# Patient Record
Sex: Male | Born: 1950
Health system: Southern US, Community
[De-identification: ages and names within clinical notes are randomized; demographics above are authoritative.]

## PROBLEM LIST (undated history)

## (undated) DIAGNOSIS — K219 Gastro-esophageal reflux disease without esophagitis: Secondary | ICD-10-CM

## (undated) DIAGNOSIS — M1990 Unspecified osteoarthritis, unspecified site: Secondary | ICD-10-CM

## (undated) DIAGNOSIS — Z789 Other specified health status: Secondary | ICD-10-CM

## (undated) DIAGNOSIS — I10 Essential (primary) hypertension: Secondary | ICD-10-CM

## (undated) DIAGNOSIS — Z860100 Personal history of colon polyps, unspecified: Secondary | ICD-10-CM

## (undated) DIAGNOSIS — E785 Hyperlipidemia, unspecified: Secondary | ICD-10-CM

## (undated) DIAGNOSIS — N419 Inflammatory disease of prostate, unspecified: Secondary | ICD-10-CM

## (undated) DIAGNOSIS — C439 Malignant melanoma of skin, unspecified: Secondary | ICD-10-CM

## (undated) DIAGNOSIS — I219 Acute myocardial infarction, unspecified: Secondary | ICD-10-CM

## (undated) DIAGNOSIS — I2510 Atherosclerotic heart disease of native coronary artery without angina pectoris: Secondary | ICD-10-CM

## (undated) DIAGNOSIS — C801 Malignant (primary) neoplasm, unspecified: Secondary | ICD-10-CM

## (undated) DIAGNOSIS — Z7902 Long term (current) use of antithrombotics/antiplatelets: Secondary | ICD-10-CM

## (undated) HISTORY — PX: TONSILLECTOMY: SUR1361

## (undated) HISTORY — PX: KNEE ARTHROSCOPY: SUR90

## (undated) HISTORY — PX: COLON SURGERY: SHX602

## (undated) HISTORY — PX: ESOPHAGEAL DILATION: SHX303

---

## 2004-01-27 ENCOUNTER — Ambulatory Visit: Payer: Self-pay

## 2006-10-10 ENCOUNTER — Ambulatory Visit: Payer: Self-pay | Admitting: Family Medicine

## 2007-09-24 ENCOUNTER — Ambulatory Visit: Payer: Self-pay | Admitting: Family Medicine

## 2011-08-23 ENCOUNTER — Ambulatory Visit: Payer: Self-pay | Admitting: Unknown Physician Specialty

## 2011-09-11 ENCOUNTER — Ambulatory Visit: Payer: Self-pay | Admitting: Unknown Physician Specialty

## 2011-09-11 LAB — HM COLONOSCOPY

## 2011-10-02 ENCOUNTER — Ambulatory Visit: Payer: Self-pay | Admitting: Surgery

## 2011-10-09 ENCOUNTER — Inpatient Hospital Stay: Payer: Self-pay | Admitting: Surgery

## 2011-10-10 LAB — CBC WITH DIFFERENTIAL/PLATELET
Basophil #: 0 10*3/uL (ref 0.0–0.1)
Basophil %: 0.4 %
Eosinophil #: 0 10*3/uL (ref 0.0–0.7)
Eosinophil %: 0.1 %
HCT: 42.5 % (ref 40.0–52.0)
HGB: 14 g/dL (ref 13.0–18.0)
MCH: 29.2 pg (ref 26.0–34.0)
Monocyte #: 1.2 x10 3/mm — ABNORMAL HIGH (ref 0.2–1.0)
Neutrophil #: 8.1 10*3/uL — ABNORMAL HIGH (ref 1.4–6.5)
Neutrophil %: 69.5 %
RBC: 4.79 10*6/uL (ref 4.40–5.90)
WBC: 11.6 10*3/uL — ABNORMAL HIGH (ref 3.8–10.6)

## 2011-10-10 LAB — BASIC METABOLIC PANEL
BUN: 7 mg/dL (ref 7–18)
Creatinine: 0.94 mg/dL (ref 0.60–1.30)
EGFR (African American): >60
EGFR (Non-African Amer.): >60
Glucose: 123 mg/dL — ABNORMAL HIGH (ref 65–99)
Sodium: 141 mmol/L (ref 136–145)

## 2012-08-05 ENCOUNTER — Ambulatory Visit: Payer: Self-pay

## 2013-09-04 ENCOUNTER — Emergency Department: Payer: Self-pay | Admitting: Emergency Medicine

## 2013-09-04 LAB — BASIC METABOLIC PANEL
ANION GAP: 5 — AB (ref 7–16)
BUN: 15 mg/dL (ref 7–18)
CALCIUM: 9.3 mg/dL (ref 8.5–10.1)
CHLORIDE: 107 mmol/L (ref 98–107)
Co2: 29 mmol/L (ref 21–32)
Creatinine: 0.99 mg/dL (ref 0.60–1.30)
EGFR (African American): >60
EGFR (Non-African Amer.): >60
GLUCOSE: 95 mg/dL (ref 65–99)
OSMOLALITY: 282 (ref 275–301)
POTASSIUM: 3.9 mmol/L (ref 3.5–5.1)
Sodium: 141 mmol/L (ref 136–145)

## 2013-09-04 LAB — CBC WITH DIFFERENTIAL/PLATELET
BASOS PCT: 0.6 %
Basophil #: 0 10*3/uL (ref 0.0–0.1)
EOS ABS: 0.1 10*3/uL (ref 0.0–0.7)
Eosinophil %: 0.9 %
HCT: 47.5 % (ref 40.0–52.0)
HGB: 15.6 g/dL (ref 13.0–18.0)
LYMPHS PCT: 23.2 %
Lymphocyte #: 1.5 10*3/uL (ref 1.0–3.6)
MCH: 29.7 pg (ref 26.0–34.0)
MCHC: 32.9 g/dL (ref 32.0–36.0)
MCV: 90 fL (ref 80–100)
MONO ABS: 0.6 x10 3/mm (ref 0.2–1.0)
MONOS PCT: 10.2 %
NEUTROS ABS: 4.1 10*3/uL (ref 1.4–6.5)
Neutrophil %: 65.1 %
Platelet: 215 10*3/uL (ref 150–440)
RBC: 5.26 10*6/uL (ref 4.40–5.90)
RDW: 12.9 % (ref 11.5–14.5)
WBC: 6.3 10*3/uL (ref 3.8–10.6)

## 2013-09-04 LAB — TROPONIN I

## 2013-10-02 IMAGING — CT CT ABD-PELV W/ CM
1 of 3 series · 14 of 32 positions shown, 19 images · IV contrast (isovue)
Comparison: 09/24/2007

REASON FOR EXAM: LLQ Pain Change in Bowel Habits
COMMENTS:

PROCEDURE:     KCT - KCT ABDOMEN/PELVIS W  - August 23, 2011 [DATE]
RESULT:     History: Left lower quadrant pain
TECHNIQUE: Multiple axial images of the abdomen and pelvis were performed
from the lung bases to the pubic symphysis, with p.o. contrast and with 100
ml of Isovue 370 intravenous contrast.

[Series 2: abd 3mm w 3.0 i40f 3 · axial · 0.88mm/px · z∈[-779,-356]mm · 14 of 159 slices shown, 19 images]
[im 9/159  soft-tissue]
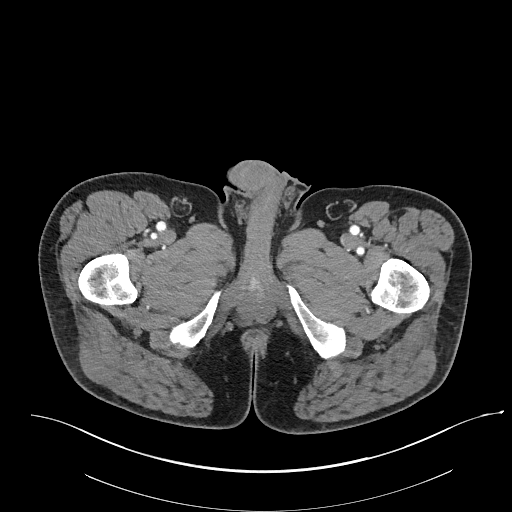
[im 9/159  bone]
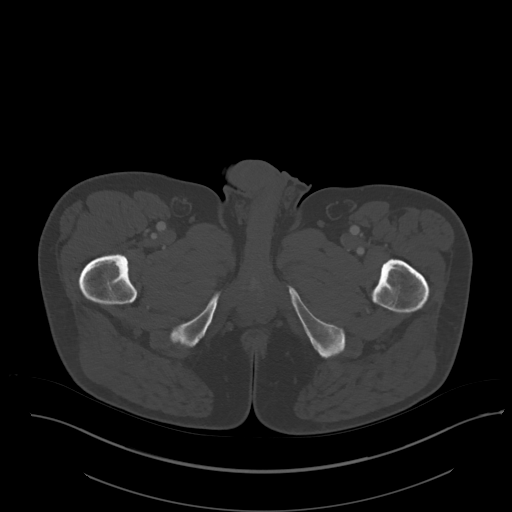
[im 25/159  soft-tissue]
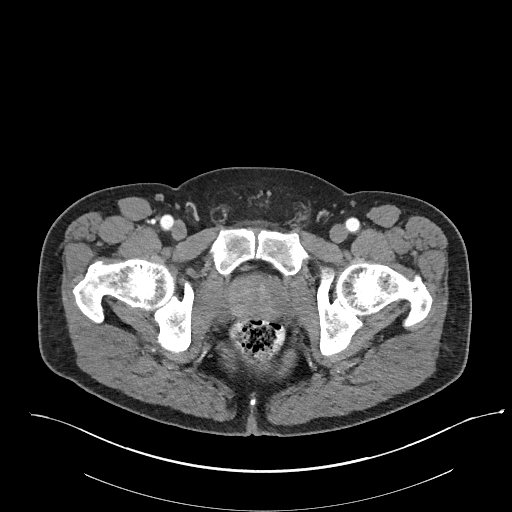
[im 34/159  soft-tissue]
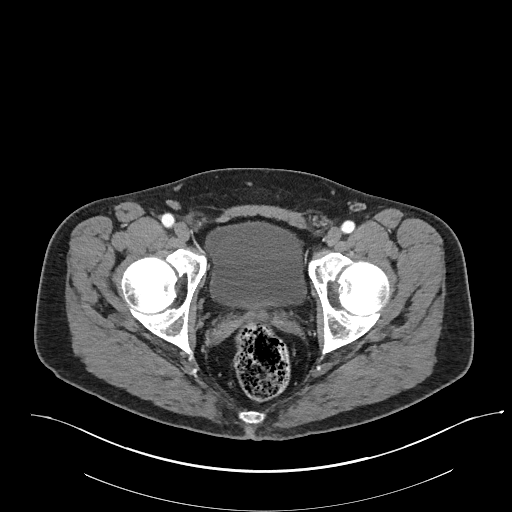
[im 42/159  soft-tissue]
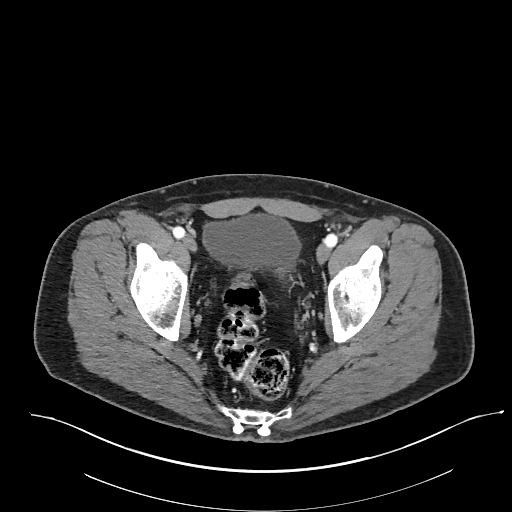
[im 59/159  soft-tissue]
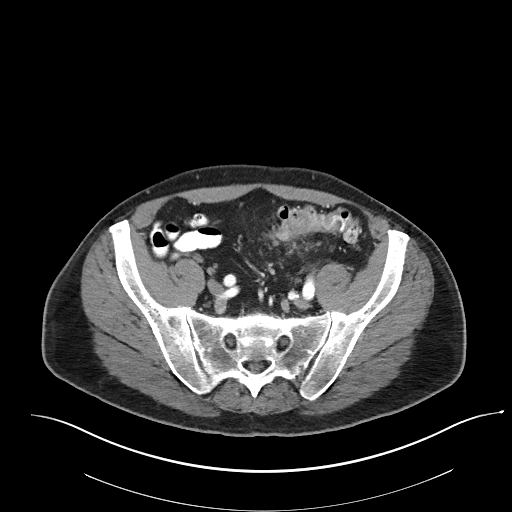
[im 67/159  soft-tissue]
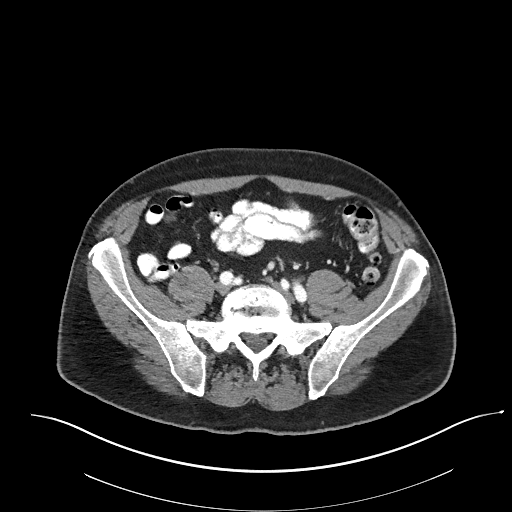
[im 84/159  soft-tissue]
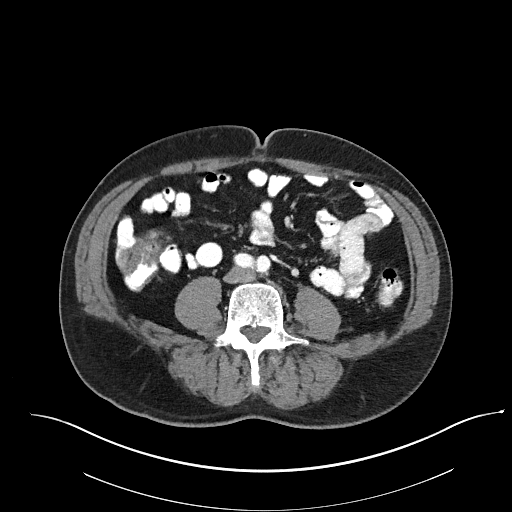
[im 92/159  soft-tissue]
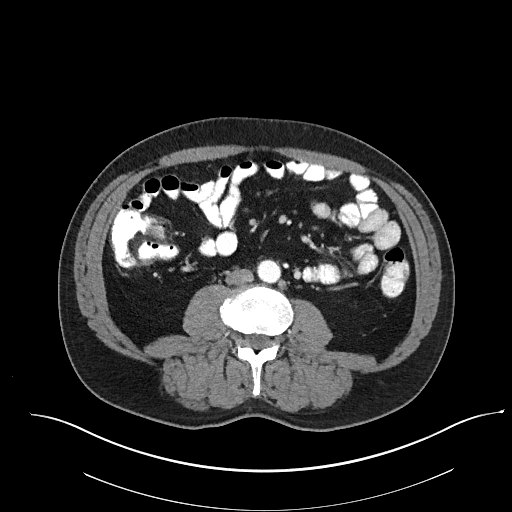
[im 100/159  soft-tissue]
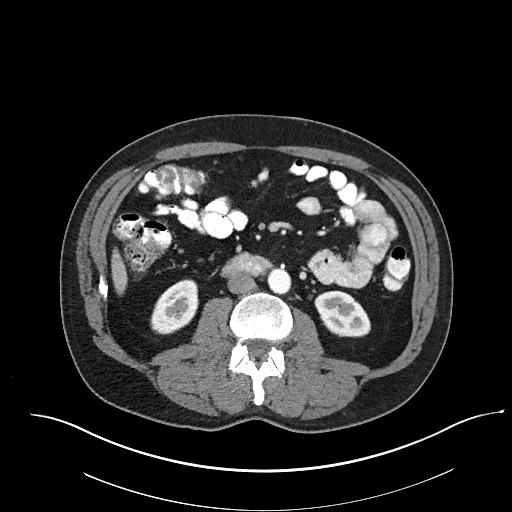
[im 100/159  bone]
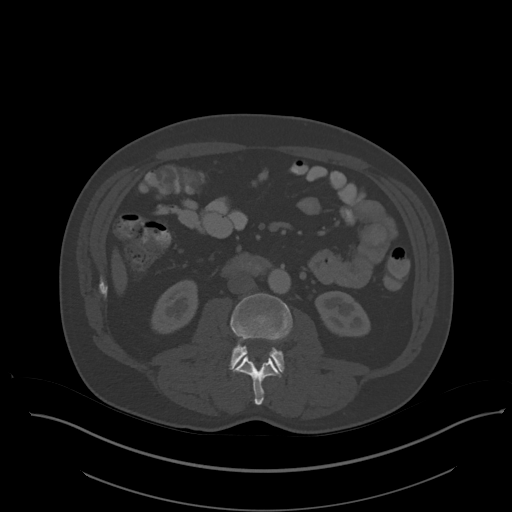
[im 117/159  soft-tissue]
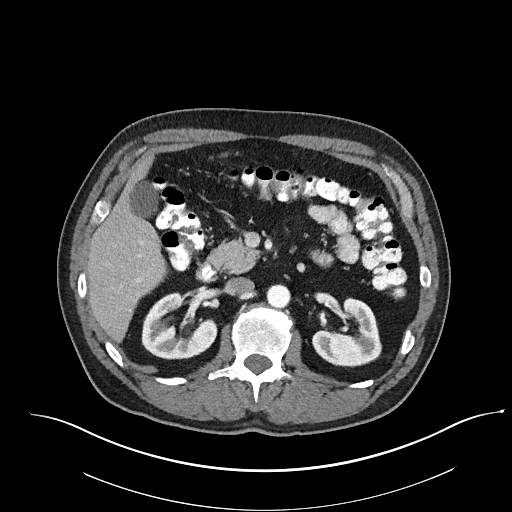
[im 125/159  soft-tissue]
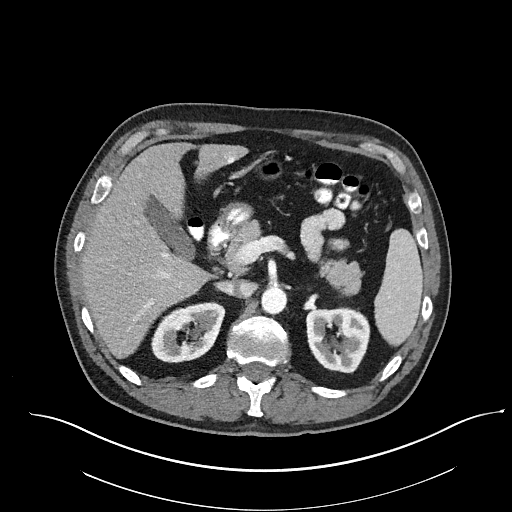
[im 125/159  lung]
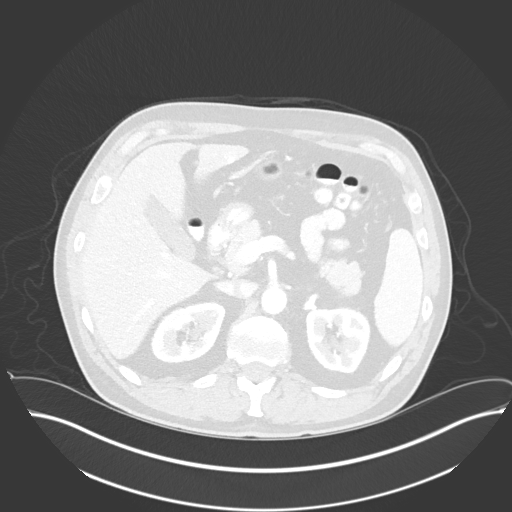
[im 134/159  soft-tissue]
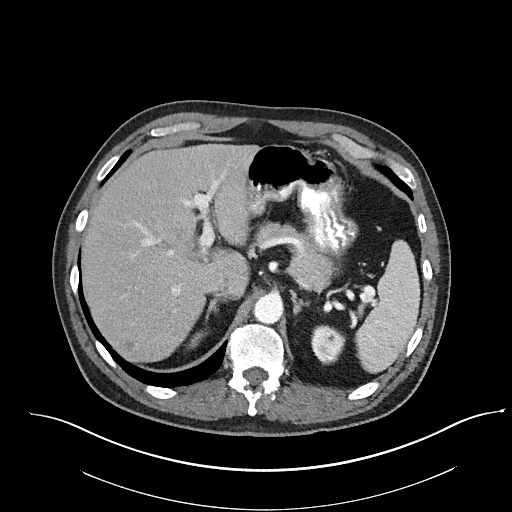
[im 134/159  lung]
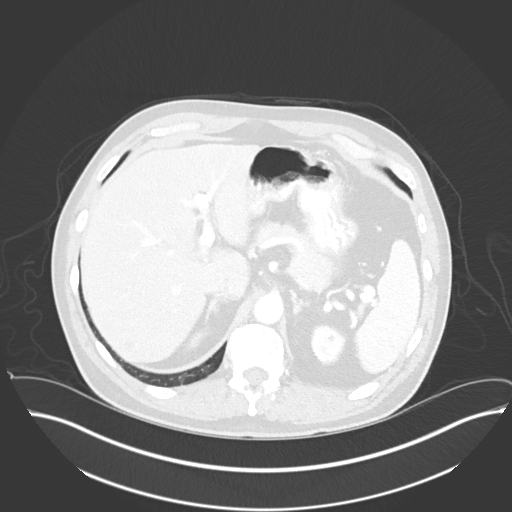
[im 142/159  lung]
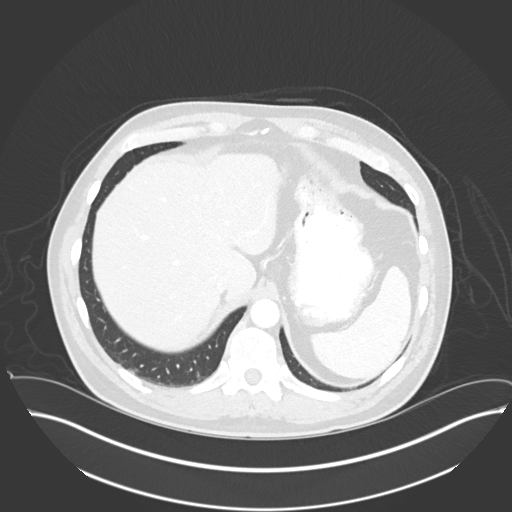
[im 150/159  soft-tissue]
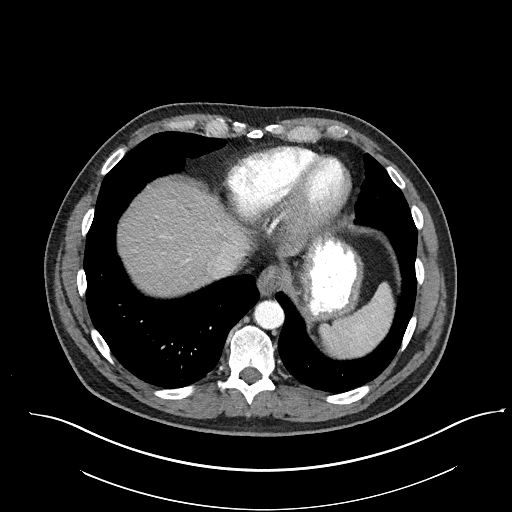
[im 150/159  lung]
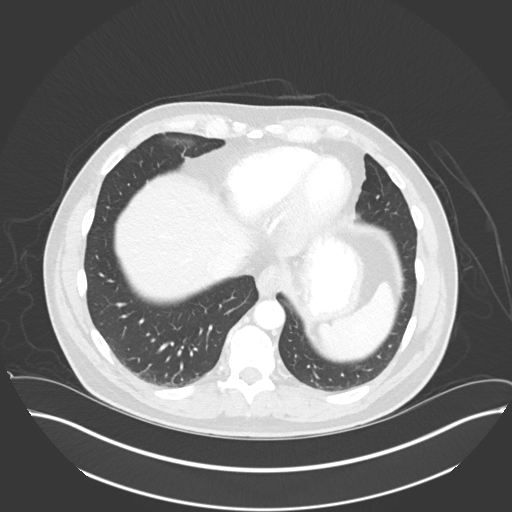

[14 of 32 positions shown; findings below may reference images not displayed]

FINDINGS: The lung bases are clear. There is no pneumothorax. The heart size is
normal.

There is a 2.4 cm hypodense mass in the right hepatic lobe peripherally
which is indeterminate, but unchanged compared with 09/24/2007. There is
filling in on the delayed images suggesting a hemangioma. There is no
intrahepatic or extrahepatic biliary ductal dilatation. The gallbladder is
unremarkable. The spleen demonstrates no focal abnormality. The kidneys,
adrenal glands, and pancreas are normal. The bladder is unremarkable.

The stomach, duodenum, small intestine, and large intestine demonstrate no
contrast extravasation or dilatation. There is focal bowel wall thickening
involving the sigmoid colon with mild perisigmoidal inflammatory changes.
There are small perisigmoidal lymph nodes present. There is diverticulosis
involving the sigmoid colon. There is no pneumoperitoneum, pneumatosis, or
portal venous gas. There is no abdominal or pelvic free fluid. There is no
lymphadenopathy.

The abdominal aorta is normal in caliber.

The osseous structures are unremarkable.
IMPRESSION: 1. Diverticulosis of the sigmoid colon. There is focal bowel wall thickening
involving the proximal sigmoid colon measuring approximately 4.5 cm in
length weighted minimal perisigmoidal inflammatory change and tiny
perisigmoidal lymph nodes present. Although this may represent mild
diverticulitis, underlying malignancy is of concern. Further evaluation with
colonoscopy is recommended.

[REDACTED]

## 2014-03-27 HISTORY — PX: CORONARY ANGIOPLASTY WITH STENT PLACEMENT: SHX49

## 2014-06-11 LAB — BASIC METABOLIC PANEL
BUN: 18 mg/dL (ref 4–21)
CREATININE: 1 mg/dL (ref <=1.3)
Glucose: 102 mg/dL
Potassium: 4.6 mmol/L (ref 3.4–5.3)
Sodium: 146 mmol/L (ref 137–147)

## 2014-06-11 LAB — HEPATIC FUNCTION PANEL
ALK PHOS: 98 U/L (ref 25–125)
ALT: 22 U/L (ref 10–40)
AST: 13 U/L — AB (ref 14–40)
Bilirubin, Total: 0.4 mg/dL

## 2014-06-11 LAB — CBC AND DIFFERENTIAL
HEMATOCRIT: 46 % (ref 41–53)
HEMOGLOBIN: 15.4 g/dL (ref 13.5–17.5)
Neutrophils Absolute: 50 /uL
PLATELETS: 215 10*3/uL (ref 150–399)
WBC: 5.5 10*3/mL

## 2014-06-11 LAB — LIPID PANEL
Cholesterol: 232 mg/dL — AB (ref 0–200)
HDL: 46 mg/dL (ref 35–70)
LDL CALC: 167 mg/dL
Triglycerides: 94 mg/dL (ref 40–160)

## 2014-06-11 LAB — TSH: TSH: 1.59 u[IU]/mL (ref <=5.90)

## 2014-06-11 LAB — PSA: PSA: 2.9

## 2014-07-14 NOTE — Discharge Summary (Signed)
PATIENT NAME:  Kevin Moore, Kevin Moore MR#:  [id number] DATE OF BIRTH:  1951-03-05  DATE OF ADMISSION:  10/09/2011 DATE OF DISCHARGE:  10/12/2011  HISTORY OF PRESENT ILLNESS: This 64 year old male was brought into the hospital for elective sigmoid colectomy. He had a history of multiple bouts of diverticulitis over a period of years.   PAST MEDICAL HISTORY:  1. Coronary artery disease.  2. Hypertension. 3. Hypercholesterolemia. 4. Gastroesophageal reflux. 5. Panic attacks.   MEDICATIONS: Aspirin, omeprazole, and Advil.  NOTE: Other details are recorded on the typed History and Physical.   HOSPITAL COURSE: He was brought in through the outpatient surgery department and went to the operating room where he had a low anterior resection of the sigmoid colon. He did have a preop prophylactic antibiotic. He was also treated with prophylactic subcutaneous heparin. He was kept in the hospital for a number of days of observation. He began a clear liquid diet and gradually advanced his diet, tolerated this satisfactorily, and did move his bowels.   Final pathology demonstrated diverticulosis and also a paracolonic abscess with a splinter of bone.   FINAL DIAGNOSIS: Diverticulitis.          OPERATION: Sigmoid colectomy.   DISCHARGE INSTRUCTIONS: Wound care instructions given and plans made for follow-up in the office. ____________________________ J. Rochel Brome, MD jws:slb D: 10/23/2011 20:11:35 ET T: 10/24/2011 08:38:11 ET JOB#: [id number]  cc: Loreli Dollar, MD, <Dictator> Loreli Dollar MD ELECTRONICALLY SIGNED 10/27/2011 21:02

## 2014-07-19 NOTE — Op Note (Signed)
PATIENT NAME:  Kevin Moore, Kevin Moore MR#:  [id number] DATE OF BIRTH:  [birth year]  DATE OF PROCEDURE:  10/09/2011  PREOPERATIVE DIAGNOSIS: Chronic recurrent diverticulitis.   POSTOPERATIVE DIAGNOSIS: Chronic recurrent diverticulitis.      PROCEDURE: Sigmoid colectomy.   SURGEON: Rochel Brome, M.D.   ANESTHESIA: General.   INDICATION: This 64 year old male has had bouts of diverticulitis each year for the last five years and recurrent diverticulitis this year. He had colonoscopy which had findings consistent with diverticulitis. Also, a CT scan which demonstrated evidence of diverticulitis of the sigmoid colon and surgery was recommended for definitive treatment.   DESCRIPTION OF PROCEDURE: The patient was placed on the operating table in the supine position under general anesthesia. The abdomen was clipped. The circulating nurse inserted a Foley urinary catheter with Betadine preparation of the penis draining clear yellow urine. The legs were placed into the lithotomy position using the bumblebee stirrups. The abdomen was prepared with ChloraPrep. The perineal genital and anal areas were prepared with Betadine solution and the abdomen was draped out in a sterile manner. A lower abdominal midline incision was made and carried down through the subcutaneous tissues. Several small bleeding points were cauterized. The midline fascia was incised. The peritoneum was incised. The abdominal cavity was opened. On initial inspection there was no palpable mass within the liver. There was a palpable mass within the mid aspect of the sigmoid colon. The patient was placed in Trendelenburg position. The small bowel was retracted out of the pelvis placing lap packs over the small bowel. The sigmoid colon was mobilized with incision of the lateral peritoneal reflection. The mass was further demonstrated. It was approximately 5 to 6 cm in dimension and was consistent with chronic diverticulitis. A number of diverticula  was demonstrated. Next, the descending colon and splenic flexure was mobilized with incision of lateral peritoneal reflection and use of electrocautery and Harmonic scalpel. The dissection was carried further down into the pelvis. There was a moderate amount of fatty tissue along the pelvic sidewall. The site for proximal margin of resection was selected of the inflammatory mass and a window was created in the mesentery and the mesenteric dissection was then begun with the Harmonic scalpel. Also, the distal segment was selected at the junction of the sigmoid colon and rectum and the bowel was dissected circumferentially. Next, further dissection was carried out dividing vascular pedicles. Two pedicles were ligated with 0 chromic ligature and suture ligature and further dissection was carried out dividing the mesentery using the Harmonic scalpel. The proximal portion of bowel was divided with the GIA 75 stapler, allowing better exposure of the mesentery as its dissection continued and then after some dissection placed the GIA across the proximal aspect of the rectum and divided it. The staple lines were hemostatic. The specimen was tagged with a stitch at its distal end with some 6 inches in length and submitted in formalin for routine pathology. Next, the proximal portion was opened by excising the staple line and Allis clamps were used to lift up the edges of the colon. The sizers were used. The small sizer fit in easily. Medium sizer fit in easily. Large sizer would not fit. The Prolene suture was used using a 2-0 Prolene running pursestring suture. Next, the sizers were placed from the rectum up to the staple line in the upper aspect of the rectum and the small, medium and large sizers advanced up to the staple line and elected to use the 29 mm EEA stapler  which was disengaged and advanced up through the anal canal up into the rectum up to the staple line. The anvil was placed into the proximal portion of the  colon and the pursestring was tied down. Next, the pin was advanced just posterior to the staple line and the pin was engaged to the anvil, and the EEA was approximated seeing that it was in the firing range. It was activated and then was disengaged and removed. The anastomotic rings were intact. Gloves were changed. The anastomosis was inspected and appeared to be intact. There were two points which were imbricated with 5-0 Vicryl. It is noted during the course of the procedure there was some oozing along the site of dissection and by completing the anastomosis. It appeared that hemostasis was intact. Next, all lap packs were removed. The omentum was brought beneath the wound. The peritoneum was closed with a running 3-0 chromic stitch. The fascia was closed with interrupted 0 Maxon figure-of-eight sutures and the skin was closed with clips. Dressings were applied with paper tape. The patient tolerated the procedure satisfactorily and the patient was then prepared for transfer to the recovery room with a Foley catheter in place.   ____________________________ J. Rochel Brome, MD jws:ap D: 10/09/2011 10:26:59 ET T: 10/09/2011 11:14:08 ET JOB#: [id number]  cc: Loreli Dollar, MD, <Dictator> Loreli Dollar MD ELECTRONICALLY SIGNED 10/10/2011 10:27

## 2014-08-25 DIAGNOSIS — E785 Hyperlipidemia, unspecified: Secondary | ICD-10-CM | POA: Insufficient documentation

## 2014-08-25 DIAGNOSIS — I2510 Atherosclerotic heart disease of native coronary artery without angina pectoris: Secondary | ICD-10-CM | POA: Insufficient documentation

## 2014-08-25 DIAGNOSIS — K219 Gastro-esophageal reflux disease without esophagitis: Secondary | ICD-10-CM | POA: Insufficient documentation

## 2014-08-25 DIAGNOSIS — I10 Essential (primary) hypertension: Secondary | ICD-10-CM | POA: Insufficient documentation

## 2014-08-26 ENCOUNTER — Encounter: Payer: Self-pay | Admitting: Family Medicine

## 2014-08-26 ENCOUNTER — Ambulatory Visit (INDEPENDENT_AMBULATORY_CARE_PROVIDER_SITE_OTHER): Payer: BLUE CROSS/BLUE SHIELD | Admitting: Family Medicine

## 2014-08-26 VITALS — BP 138/80 | HR 84 | Temp 98.6°F | Resp 16 | Wt 222.0 lb

## 2014-08-26 DIAGNOSIS — I10 Essential (primary) hypertension: Secondary | ICD-10-CM | POA: Diagnosis not present

## 2014-08-26 MED ORDER — LOSARTAN POTASSIUM 100 MG PO TABS: 100.0000 mg | ORAL_TABLET | Freq: Every day | ORAL | Status: DC

## 2014-08-26 NOTE — Progress Notes (Signed)
   Subjective:    Patient ID: Kevin Moore, male    DOB: 1950/07/27, 64 y.o.   MRN: [id number]  HPI  Hypertension Patient is here for follow-up of elevated blood pressure. He is not exercising and is not adherent to a low-salt diet. Blood pressure is well controlled at home. Cardiac symptoms: palpitations. Patient denies chest pain. Cardiovascular risk factors: advanced age (older than 62 for men, 35 for women). Use of agents associated with hypertension: NSAIDS. History of target organ damage: none.    Review of Systems  Constitutional: Negative.   Respiratory: Negative.   Cardiovascular: Positive for palpitations (occassional).  Neurological: Negative for dizziness, tremors, seizures, syncope, speech difficulty, weakness, numbness and headaches. Light-headedness: occassionally when he first goes to stand up.  This has just started since starting the increased dose of Losartan.       Objective:   Physical Exam  Constitutional: He is oriented to person, place, and time. He appears well-developed and well-nourished.  HENT:  Head: Normocephalic and atraumatic.  Eyes: Conjunctivae and EOM are normal. Pupils are equal, round, and reactive to light.  Neck: Normal range of motion. Neck supple.  Cardiovascular: Normal rate, regular rhythm and normal heart sounds.   Pulmonary/Chest: Effort normal and breath sounds normal.  Abdominal: Soft. Bowel sounds are normal.  Musculoskeletal: Normal range of motion.  Neurological: He is alert and oriented to person, place, and time.  Skin: Skin is warm and dry.  Psychiatric: He has a normal mood and affect. His behavior is normal. Judgment and thought content normal.          Assessment & Plan:  1. Essential hypertension  - losartan (COZAAR) 100 MG tablet; Take 1 tablet (100 mg total) by mouth daily.  Dispense: 90 tablet; Refill: 3

## 2014-08-26 NOTE — Progress Notes (Signed)
   Subjective:    Patient ID: Kevin Moore, male    DOB: [birth year], 64 y.o.   MRN: [id number]  Hypertension This is a chronic problem. The current episode started more than 1 year ago. The problem has been gradually worsening since onset. Associated symptoms include chest pain, palpitations and shortness of breath. There are no associated agents to hypertension.      Review of Systems  Constitutional: Negative.   Respiratory: Positive for cough, chest tightness and shortness of breath.   Cardiovascular: Positive for chest pain, palpitations and leg swelling.  Endocrine: Negative.   Neurological: Negative.   Psychiatric/Behavioral: Negative.        Objective:   Physical Exam  Constitutional: He is oriented to person, place, and time. He appears well-developed and well-nourished.  HENT:  Head: Normocephalic and atraumatic.  Eyes: Conjunctivae and EOM are normal. Pupils are equal, round, and reactive to light.  Neck: Normal range of motion. Neck supple.  Cardiovascular: Normal rate, regular rhythm, normal heart sounds and intact distal pulses.   Pulmonary/Chest: Effort normal and breath sounds normal.  Abdominal: Soft. Bowel sounds are normal.  Musculoskeletal: Normal range of motion.  Neurological: He is alert and oriented to person, place, and time.  Skin: Skin is warm and dry.  Psychiatric: He has a normal mood and affect. His behavior is normal. Judgment and thought content normal.  Vitals reviewed.         Assessment & Plan:  Improving with lessening orthostasis.

## 2014-09-15 IMAGING — CT CT ABDOMEN AND PELVIS WITHOUT AND WITH CONTRAST
2 of 4 series · 14 of 32 positions shown, 19 images · non-contrast
Comparison: none

REASON FOR EXAM: hematuria
COMMENTS:

[Series 4: with 3.0 i40f 3 · axial · 0.93mm/px · z∈[-480,-102]mm · 8 of 164 slices shown, 13 images]
[im 19/164  soft-tissue]
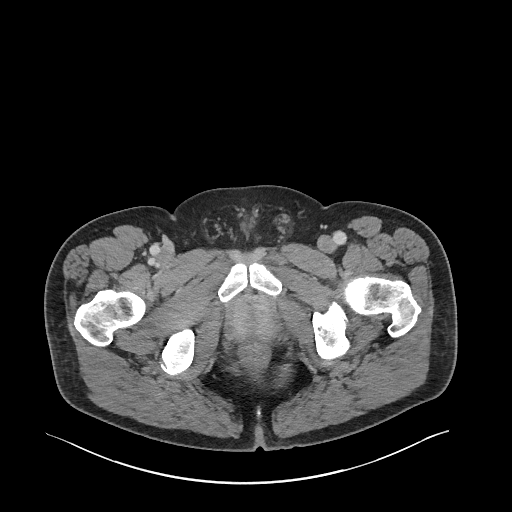
[im 19/164  bone]
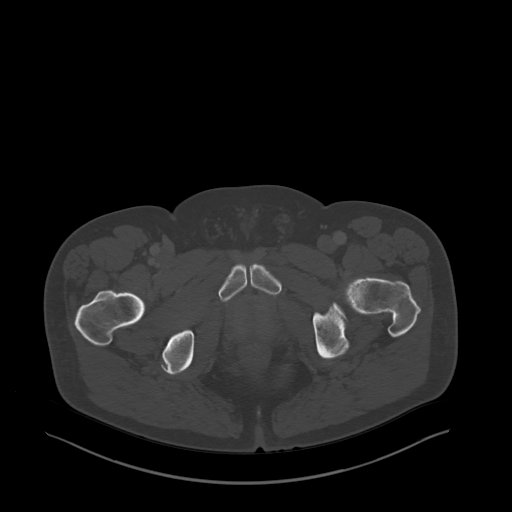
[im 37/164  soft-tissue]
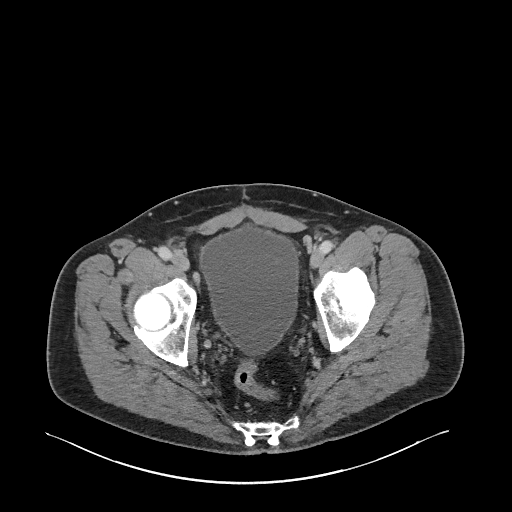
[im 55/164  soft-tissue]
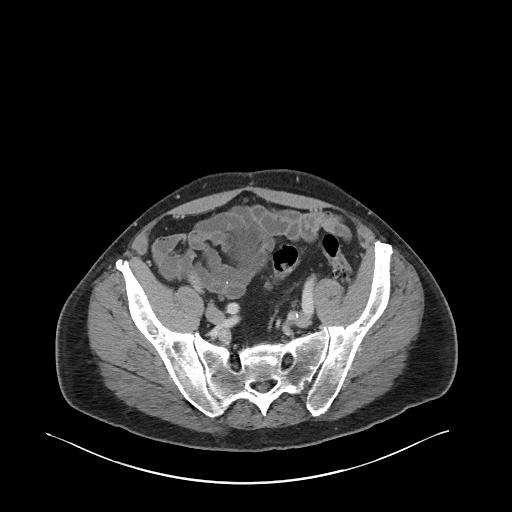
[im 73/164  soft-tissue]
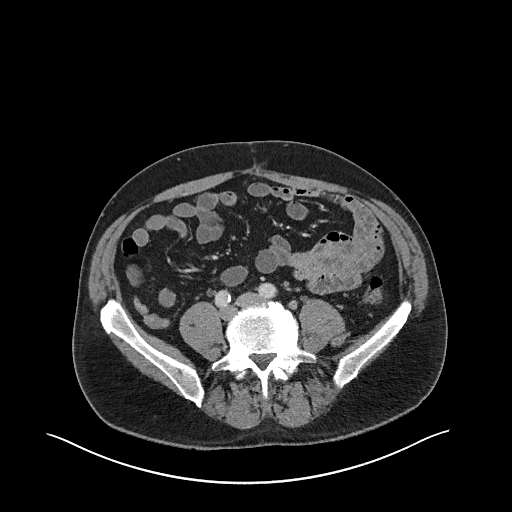
[im 91/164  soft-tissue]
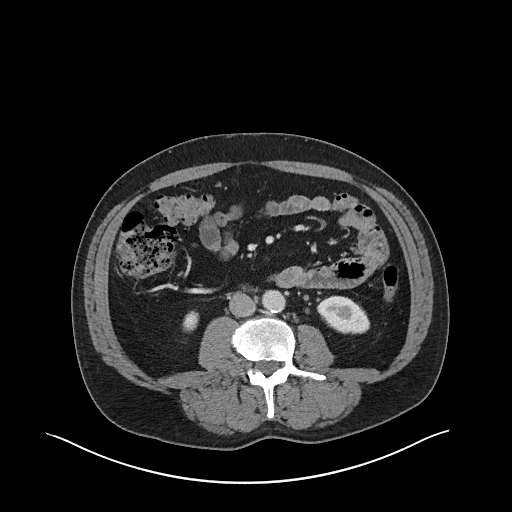
[im 91/164  lung]
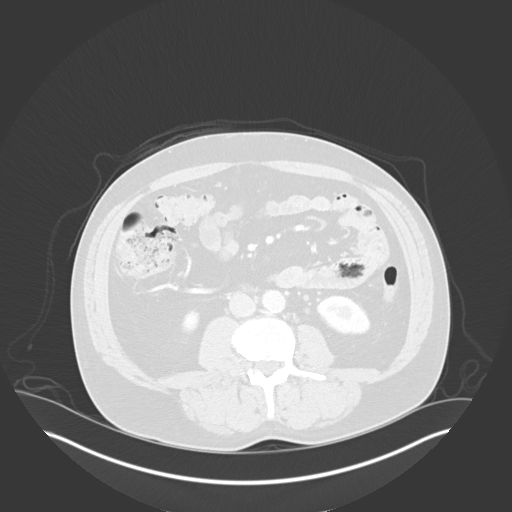
[im 109/164  soft-tissue]
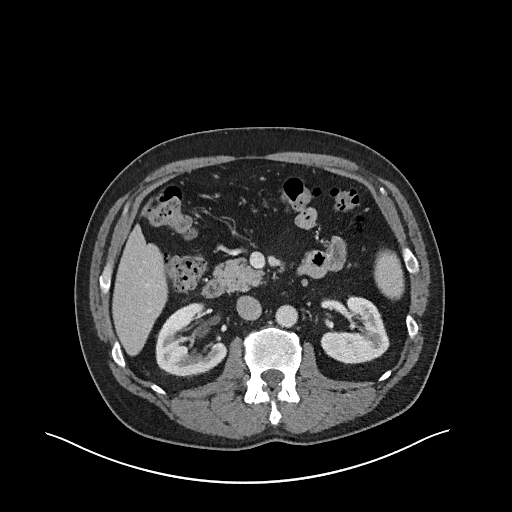
[im 109/164  lung]
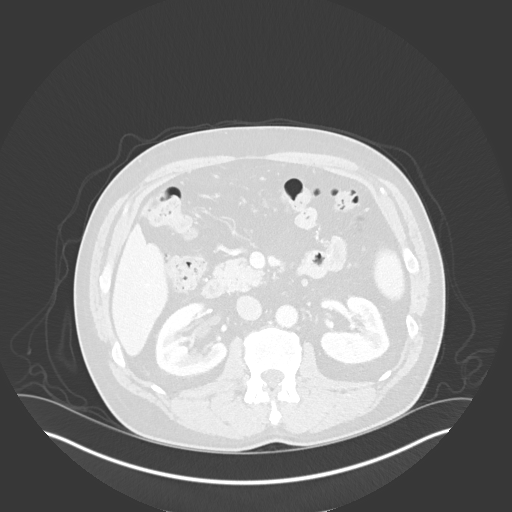
[im 127/164  soft-tissue]
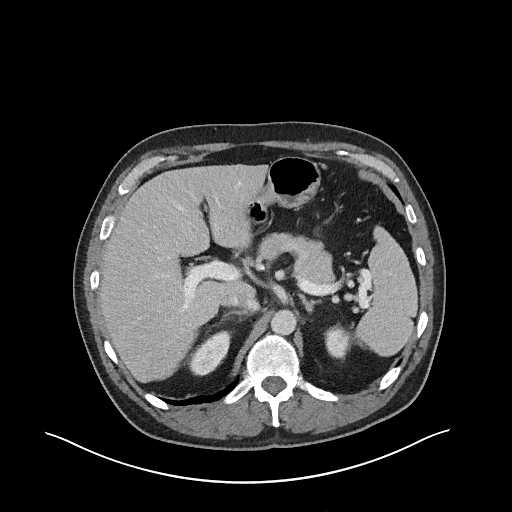
[im 127/164  lung]
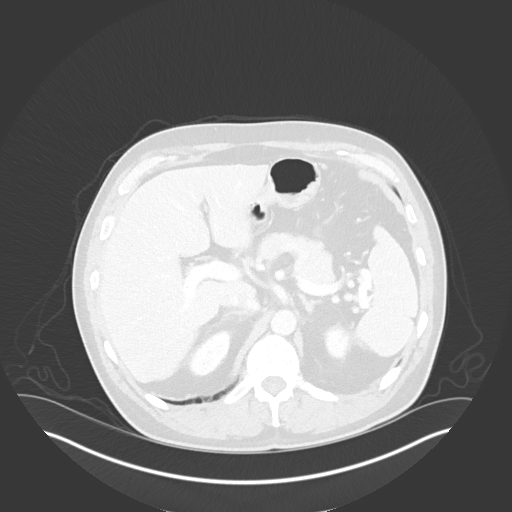
[im 145/164  soft-tissue]
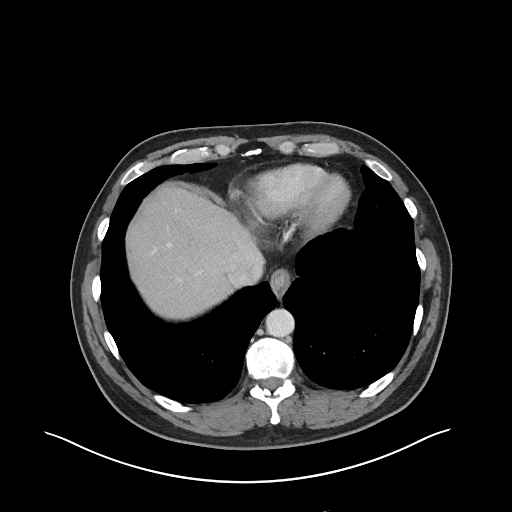
[im 145/164  lung]
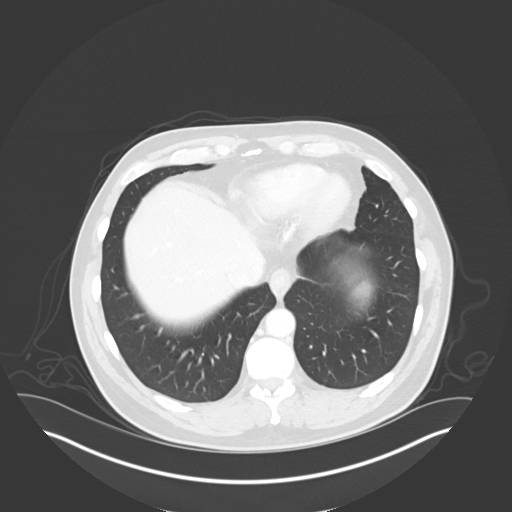

[Series 6: delay 3.0 i40f 3 · axial · delayed · 0.93mm/px · z∈[-480,-210]mm · 6 of 164 slices shown]
[im 19/164  soft-tissue]
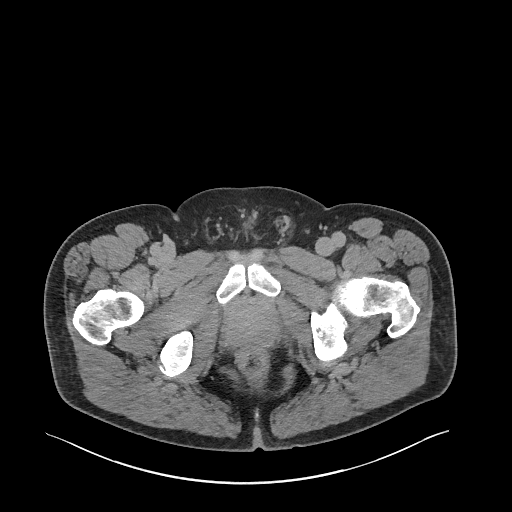
[im 37/164  soft-tissue]
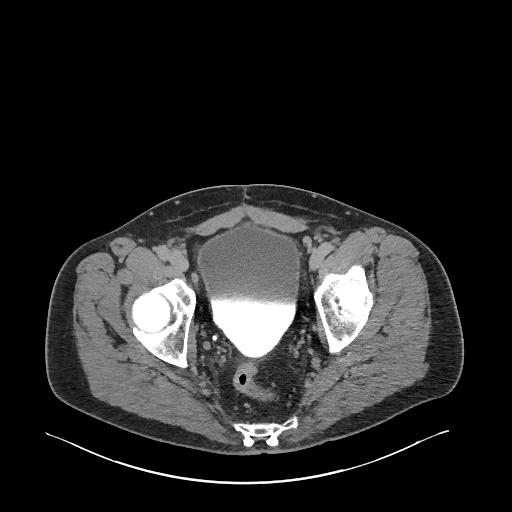
[im 55/164  soft-tissue]
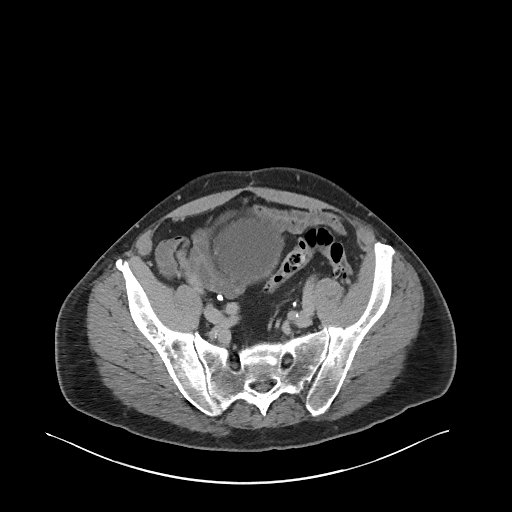
[im 73/164  soft-tissue]
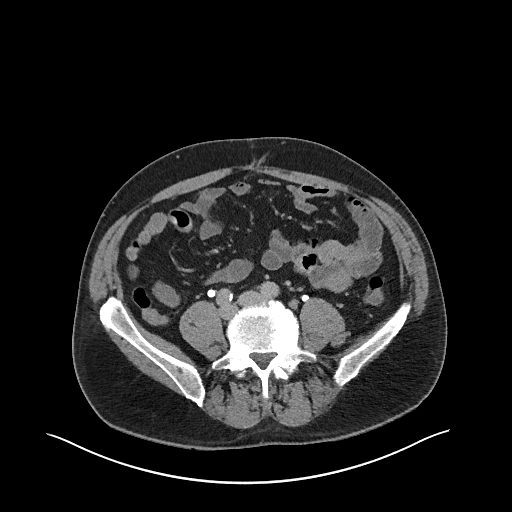
[im 91/164  soft-tissue]
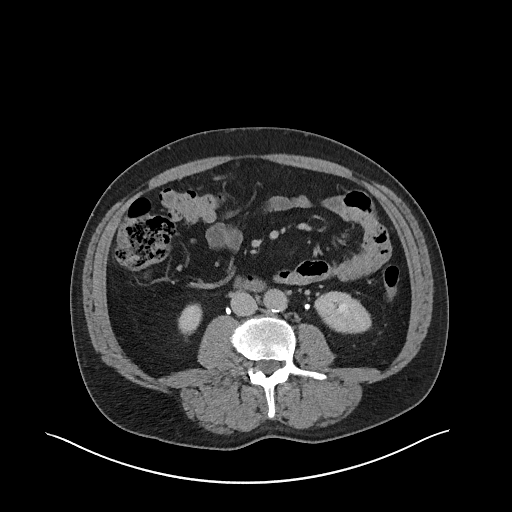
[im 109/164  soft-tissue]
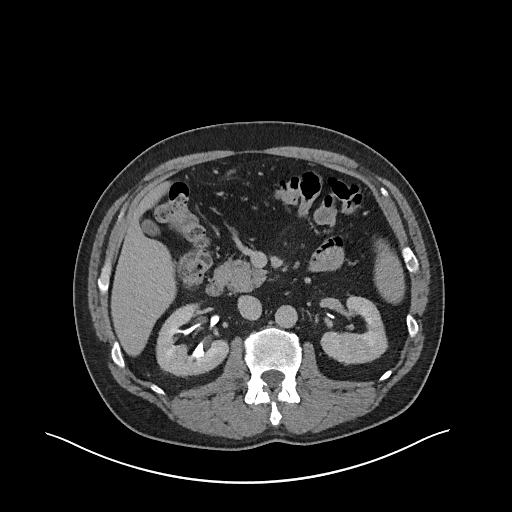

[14 of 32 positions shown; findings below may reference images not displayed]

PROCEDURE:     KCT - KCT ABDOMEN/PELVIS W/WO  - August 05, 2012  [DATE]

RESULT:     A triphasic CT of the abdomen and pelvis is performed utilizing
100 mL of Esovue-DND iodinated intravenous contrast with images
reconstructed at 3 mm slice thickness in the axial plane compared to
previous contrast-enhanced exam [REDACTED] [id number] with postcontrast delayed
images through the kidneys.

Images through the base the lungs demonstrate grossly normal appearing
aeration without pleural or pericardial effusion or focal mass. Noncontrast
images demonstrate what appears to be prostate enlargement. Colonic
diverticulosis is present without definite evidence of acute diverticulitis.
The parapelvic cysts are present in the left kidney. Neither kidney shows
obstructive change. Normal appearing appendix is present. No adenopathy is
evident. The abdominal aorta is normal in caliber with minimal
atherosclerotic calcification. The pancreas appears be grossly normal. The
gallbladder, liver, spleen and adrenal glands appear to be unremarkable.
There is some low-attenuation along the diaphragm in the right lobe of the
liver which was present previously on the immediate post contrast images. On
today's exam this is not definitely identified on the postcontrast delayed
images, suggesting that this likely represents a hemangioma rather than a
cyst. Size is unchanged and measures approximately 2.4 cm on image 29 of the
immediate postcontrast study.
IMPRESSION: 1. Parapelvic cysts in the left kidney.
2. Prostate enlargement which could be benign or malignant. Correlate and
agree with digital rectal exam and PSA.
3. Colonic diverticulosis predominantly in the sigmoid colon. Four probable
hepatic hemangioma in the subdiaphragmatic right lobe.

[REDACTED]

## 2014-10-16 DIAGNOSIS — I469 Cardiac arrest, cause unspecified: Secondary | ICD-10-CM

## 2014-10-16 DIAGNOSIS — I4901 Ventricular fibrillation: Secondary | ICD-10-CM

## 2014-10-16 DIAGNOSIS — I219 Acute myocardial infarction, unspecified: Secondary | ICD-10-CM | POA: Insufficient documentation

## 2014-10-16 HISTORY — DX: Cardiac arrest, cause unspecified: I46.9

## 2014-10-16 HISTORY — DX: Ventricular fibrillation: I49.01

## 2014-10-17 DIAGNOSIS — I4729 Other ventricular tachycardia: Secondary | ICD-10-CM

## 2014-10-17 DIAGNOSIS — I2119 ST elevation (STEMI) myocardial infarction involving other coronary artery of inferior wall: Secondary | ICD-10-CM

## 2014-10-17 HISTORY — DX: Other ventricular tachycardia: I47.29

## 2014-10-17 HISTORY — DX: ST elevation (STEMI) myocardial infarction involving other coronary artery of inferior wall: I21.19

## 2014-10-17 HISTORY — PX: CORONARY ANGIOPLASTY WITH STENT PLACEMENT: SHX49

## 2014-10-20 HISTORY — PX: CORONARY STENT INTERVENTION: CATH118234

## 2014-10-30 ENCOUNTER — Telehealth: Payer: Self-pay | Admitting: Family Medicine

## 2014-10-30 NOTE — Telephone Encounter (Signed)
Spoke with Kevin Moore, pt states he doesn't have any fever, no chest congestion. Just the cough for 4 weeks that is bothering. Pt says is a dry cough and that sometimes he(pt) is unable to finish a conversation with out coughing a lot. Already tried some Delsym is not helping. Will like to know if there is anything else for him to try or get. Please advise.  Thanks,  -[name]

## 2014-10-30 NOTE — Telephone Encounter (Signed)
Patient was advised he states that he will try the Delysm OTC and if symptoms do not improve by weekend he will schedule ov

## 2014-10-30 NOTE — Telephone Encounter (Signed)
Left message to call back  

## 2014-10-30 NOTE — Telephone Encounter (Signed)
Pt called saying he still has a lingering cough left over from his sinus infection.  He wants to know is there something he can take to get rid of if.  Its been going on for weeks.  He uses [employer].  Please advise.  Lavonne Chick

## 2014-10-30 NOTE — Telephone Encounter (Signed)
Delsym is my only over the counter suggestion. Would need to see him next week if cough still bothering.

## 2014-11-24 ENCOUNTER — Encounter: Payer: Self-pay | Admitting: Family Medicine

## 2014-12-01 ENCOUNTER — Encounter: Payer: BLUE CROSS/BLUE SHIELD | Attending: Cardiology | Admitting: *Deleted

## 2014-12-01 VITALS — Ht 73.88 in | Wt 213.2 lb

## 2014-12-01 DIAGNOSIS — Z9861 Coronary angioplasty status: Secondary | ICD-10-CM | POA: Insufficient documentation

## 2014-12-01 DIAGNOSIS — I213 ST elevation (STEMI) myocardial infarction of unspecified site: Secondary | ICD-10-CM | POA: Diagnosis present

## 2014-12-01 NOTE — Patient Instructions (Signed)
Patient Instructions  Patient Details  Name: MALONE VANBLARCOM MRN: [id number] Date of Birth: 1950/10/11 Referring Provider:  [name], MD  Below are the personal goals you chose as well as exercise and nutrition goals. Our goal is to help you keep on track towards obtaining and maintaining your goals. We will be discussing your progress on these goals with you throughout the program.  Initial Exercise Prescription:     Initial Exercise Prescription - 12/01/14 1000    Date of Initial Exercise Prescription   Date 12/01/14   Treadmill   MPH 2.8   Grade 0   Minutes 15   Bike   Level 0.4   Minutes 10   Recumbant Bike   Level 3   RPM 45   Watts 25   Minutes 15   NuStep   Level 3   Watts 40   Minutes 15   Arm Ergometer   Level 1   Watts 8   Minutes 10   Arm/Foot Ergometer   Level 4   Watts 15   Minutes 10   Cybex   Level 1   RPM 60   Minutes 10   Recumbant Elliptical   Level 1   RPM 40   Watts 10   Minutes 10   Elliptical   Level 1   Speed 3   Minutes 1   REL-XR   Level 2   Watts 35   Minutes 15   Prescription Details   Frequency (times per week) 3   Duration Progress to 30 minutes of continuous aerobic without signs/symptoms of physical distress   Intensity   THRR REST +  30   Ratings of Perceived Exertion 11-15   Progression Continue progressive overload as per policy without signs/symptoms or physical distress.   Resistance Training   Training Prescription Yes   Weight 2   Reps 10-15      Exercise Goals: Frequency: Be able to perform aerobic exercise three times per week working toward 3-5 days per week.  Intensity: Work with a perceived exertion of 11 (fairly light) - 15 (hard) as tolerated. Follow your new exercise prescription and watch for changes in prescription as you progress with the program. Changes will be reviewed with you when they are made.  Duration: You should be able to do 30 minutes of continuous aerobic exercise in  addition to a 5 minute warm-up and a 5 minute cool-down routine.  Nutrition Goals: Your personal nutrition goals will be established when you do your nutrition analysis with the dietician.  The following are nutrition guidelines to follow: Cholesterol < 200mg /day Sodium < 1500mg /day Fiber: Men over 50 yrs - 30 grams per day  Personal Goals:     Personal Goals and Risk Factors at Admission - 12/01/14 0842    Personal Goals and Risk Factors on Admission   Increase Aerobic Exercise and Physical Activity Yes;Sedentary   Intervention While in program, learn and follow the exercise prescription taught. Start at a low level workload and increase workload after able to maintain previous level for 30 minutes. Increase time before increasing intensity.   Intervention Provide exercise education and an individualized exercise prescription that will provide continued progressive overload as per policy without signs/symptoms of physical distress.   Take Less Medication Yes   Diabetes No   Hypertension Yes   Goal Participant will see blood pressure controlled within the values of 140/46mm/Hg or within value directed by their physician.   Intervention Provide nutrition &  aerobic exercise along with prescribed medications to achieve BP 140/90 or less.   Lipids Yes   Goal Cholesterol controlled with medications as prescribed, with individualized exercise RX and with personalized nutrition plan. Value goals: LDL < 70mg , HDL > 40mg . Participant states understanding of desired cholesterol values and following prescriptions.   Intervention Provide nutrition & aerobic exercise along with prescribed medications to achieve LDL 70mg , HDL >40mg .   Stress Yes   Goal To meet with psychosocial counselor for stress and relaxation information and guidance. To state understanding of performing relaxation techniques and or identifying personal stressors.   Intervention Provide education on types of stress, identifiying  stressors, and ways to cope with stress. Provide demonstration and active practice of relaxation techniques.      Tobacco Use Initial Evaluation: History  Smoking status  . Never Smoker   Smokeless tobacco  . Not on file    Copy of goals given to participant.

## 2014-12-01 NOTE — Progress Notes (Signed)
Cardiac Individual Treatment Plan  Patient Details  Name: Kevin Moore MRN: [id number] Date of Birth: 03-27-1951 Referring Provider:  [name], MD  Initial Encounter Date: Date: 12/01/14  Visit Diagnosis: ST elevation myocardial infarction (STEMI), unspecified artery  S/P PTCA (percutaneous transluminal coronary angioplasty)  Patient's Home Medications on Admission:  Current outpatient prescriptions:  .  atorvastatin (LIPITOR) 80 MG tablet, Take 80 mg by mouth., Disp: , Rfl:  .  ticagrelor (BRILINTA) 90 MG TABS tablet, Take 90 mg by mouth., Disp: , Rfl:  .  aspirin 81 MG tablet, Take by mouth., Disp: , Rfl:  .  losartan (COZAAR) 100 MG tablet, Take 1 tablet (100 mg total) by mouth daily., Disp: 90 tablet, Rfl: 3 .  omeprazole (PRILOSEC) 20 MG capsule, Take by mouth., Disp: , Rfl:   Past Medical History: No past medical history on file.  Tobacco Use: History  Smoking status  . Never Smoker   Smokeless tobacco  . Not on file    Labs: Recent Review Flowsheet Data    Labs for ITP Cardiac and Pulmonary Rehab Latest Ref Rng 06/11/2014   Cholestrol 0 - 200 mg/dL 232(A)   LDLCALC - 167   HDL 35 - 70 mg/dL 46   Trlycerides 40 - 160 mg/dL 94       Exercise Target Goals: Date: 12/01/14  Exercise Program Goal: Individual exercise prescription set with THRR, safety & activity barriers. Participant demonstrates ability to understand and report RPE using BORG scale, to self-measure pulse accurately, and to acknowledge the importance of the exercise prescription.  Exercise Prescription Goal: Starting with aerobic activity 30 plus minutes a day, 3 days per week for initial exercise prescription. Provide home exercise prescription and guidelines that participant acknowledges understanding prior to discharge.  Activity Barriers & Risk Stratification:     Activity Barriers & Risk Stratification - 12/01/14 0830    Activity Barriers & Risk Stratification   Activity  Barriers Joint Problems;Deconditioning   Risk Stratification Moderate      6 Minute Walk:     6 Minute Walk      12/01/14 1020       6 Minute Walk   Phase Initial     Distance 1675 feet     Walk Time 6 minutes     Resting HR 48 bpm     Resting BP 182/90 mmHg     Max Ex. HR 84 bpm     Max Ex. BP 188/90 mmHg     RPE 11     Symptoms No        Initial Exercise Prescription:     Initial Exercise Prescription - 12/01/14 1000    Date of Initial Exercise Prescription   Date 12/01/14   Treadmill   MPH 2.8   Grade 0   Minutes 15   Bike   Level 0.4   Minutes 10   Recumbant Bike   Level 3   RPM 45   Watts 25   Minutes 15   NuStep   Level 3   Watts 40   Minutes 15   Arm Ergometer   Level 1   Watts 8   Minutes 10   Arm/Foot Ergometer   Level 4   Watts 15   Minutes 10   Cybex   Level 1   RPM 60   Minutes 10   Recumbant Elliptical   Level 1   RPM 40   Watts 10   Minutes 10  Elliptical   Level 1   Speed 3   Minutes 1   REL-XR   Level 2   Watts 35   Minutes 15   Prescription Details   Frequency (times per week) 3   Duration Progress to 30 minutes of continuous aerobic without signs/symptoms of physical distress   Intensity   THRR REST +  30   Ratings of Perceived Exertion 11-15   Progression Continue progressive overload as per policy without signs/symptoms or physical distress.   Resistance Training   Training Prescription Yes   Weight 2   Reps 10-15      Exercise Prescription Changes:   Discharge Exercise Prescription (Final Exercise Prescription Changes):   Nutrition:  Target Goals: Understanding of nutrition guidelines, daily intake of sodium 1500mg , cholesterol 200mg , calories 30% from fat and 7% or less from saturated fats, daily to have 5 or more servings of fruits and vegetables.  Biometrics:     Pre Biometrics - 12/01/14 1041    Pre Biometrics   Height 6' 1.88" (1.877 m)   Weight 213 lb 3.2 oz (96.707 kg)   BMI  (Calculated) 27.5       Nutrition Therapy Plan and Nutrition Goals:   Nutrition Discharge: Rate Your Plate Scores:   Nutrition Goals Re-Evaluation:   Psychosocial: Target Goals: Acknowledge presence or absence of depression, maximize coping skills, provide positive support system. Participant is able to verbalize types and ability to use techniques and skills needed for reducing stress and depression.  Initial Review & Psychosocial Screening:     Initial Psych Review & Screening - 12/01/14 [place]? Yes   Barriers   Psychosocial barriers to participate in program There are no identifiable barriers or psychosocial needs.;The patient should benefit from training in stress management and relaxation.   Screening Interventions   Interventions Encouraged to exercise;Program counselor consult      Quality of Life Scores:   PHQ-9:     Recent Review Flowsheet Data    Depression screen [facility] 2/9 12/01/2014   Decreased Interest 0   Down, Depressed, Hopeless 0   PHQ - 2 Score 0   Altered sleeping 0   Tired, decreased energy 0   Change in appetite 0   Feeling bad or failure about yourself  0   Trouble concentrating 0   Moving slowly or fidgety/restless 0   Suicidal thoughts 0   PHQ-9 Score 0      Psychosocial Evaluation and Intervention:   Psychosocial Re-Evaluation:   Vocational Rehabilitation: Provide vocational rehab assistance to qualifying candidates.   Vocational Rehab Evaluation & Intervention:     Vocational Rehab - 12/01/14 [phone]    Initial Vocational Rehab Evaluation & Intervention   Assessment shows need for Vocational Rehabilitation No      Education: Education Goals: Education classes will be provided on a weekly basis, covering required topics. Participant will state understanding/return demonstration of topics presented.  Learning Barriers/Preferences:     Learning Barriers/Preferences - 12/01/14 9798     Learning Barriers/Preferences   Learning Barriers None   Learning Preferences Written Material      Education Topics: General Nutrition Guidelines/Fats and Fiber: -Group instruction provided by verbal, written material, models and posters to present the general guidelines for heart healthy nutrition. Gives an explanation and review of dietary fats and fiber.   Controlling Sodium/Reading Food Labels: -Group verbal and written material supporting the discussion of sodium use in heart healthy nutrition.  Review and explanation with models, verbal and written materials for utilization of the food label.   Exercise Physiology & Risk Factors: - Group verbal and written instruction with models to review the exercise physiology of the cardiovascular system and associated critical values. Details cardiovascular disease risk factors and the goals associated with each risk factor.   Aerobic Exercise & Resistance Training: - Gives group verbal and written discussion on the health impact of inactivity. On the components of aerobic and resistive training programs and the benefits of this training and how to safely progress through these programs.   Flexibility, Balance, General Exercise Guidelines: - Provides group verbal and written instruction on the benefits of flexibility and balance training programs. Provides general exercise guidelines with specific guidelines to those with heart or lung disease. Demonstration and skill practice provided.   Stress Management: - Provides group verbal and written instruction about the health risks of elevated stress, cause of high stress, and healthy ways to reduce stress.   Depression: - Provides group verbal and written instruction on the correlation between heart/lung disease and depressed mood, treatment options, and the stigmas associated with seeking treatment.   Anatomy & Physiology of the Heart: - Group verbal and written instruction and models  provide basic cardiac anatomy and physiology, with the coronary electrical and arterial systems. Review of: AMI, Angina, Valve disease, Heart Failure, Cardiac Arrhythmia, Pacemakers, and the ICD.   Cardiac Procedures: - Group verbal and written instruction and models to describe the testing methods done to diagnose heart disease. Reviews the outcomes of the test results. Describes the treatment choices: Medical Management, Angioplasty, or Coronary Bypass Surgery.   Cardiac Medications: - Group verbal and written instruction to review commonly prescribed medications for heart disease. Reviews the medication, class of the drug, and side effects. Includes the steps to properly store meds and maintain the prescription regimen.   Go Sex-Intimacy & Heart Disease, Get SMART - Goal Setting: - Group verbal and written instruction through game format to discuss heart disease and the return to sexual intimacy. Provides group verbal and written material to discuss and apply goal setting through the application of the S.M.A.R.T. Method.   Other Matters of the Heart: - Provides group verbal, written materials and models to describe Heart Failure, Angina, Valve Disease, and Diabetes in the realm of heart disease. Includes description of the disease process and treatment options available to the cardiac patient.   Exercise & Equipment Safety: - Individual verbal instruction and demonstration of equipment use and safety with use of the equipment.          Cardiac Rehab from 12/01/2014 in [facility] Cardiac Rehab   Date  12/01/14   Educator  SB   Instruction Review Code  2- meets goals/outcomes      Infection Prevention: - Provides verbal and written material to individual with discussion of infection control including proper hand washing and proper equipment cleaning during exercise session.      Cardiac Rehab from 12/01/2014 in [facility] Cardiac [facility]   Date  12/01/14   Educator  SB   Instruction Review Code  2-  meets goals/outcomes      Falls Prevention: - Provides verbal and written material to individual with discussion of falls prevention and safety.      Cardiac Rehab from 12/01/2014 in [facility] Cardiac Rehab   Date  12/01/14   Educator  SB   Instruction Review Code  2- meets goals/outcomes      Diabetes: - Individual verbal  and written instruction to review signs/symptoms of diabetes, desired ranges of glucose level fasting, after meals and with exercise. Advice that pre and post exercise glucose checks will be done for 3 sessions at entry of program.    Knowledge Questionnaire Score:     Knowledge Questionnaire Score - 12/01/14 1218    Knowledge Questionnaire Score   Pre Score 23/28      Personal Goals and Risk Factors at Admission:     Personal Goals and Risk Factors at Admission - 12/01/14 0842    Personal Goals and Risk Factors on Admission   Increase Aerobic Exercise and Physical Activity Yes;Sedentary   Intervention While in program, learn and follow the exercise prescription taught. Start at a low level workload and increase workload after able to maintain previous level for 30 minutes. Increase time before increasing intensity.   Intervention Provide exercise education and an individualized exercise prescription that will provide continued progressive overload as per policy without signs/symptoms of physical distress.   Take Less Medication Yes   Diabetes No   Hypertension Yes   Goal Participant will see blood pressure controlled within the values of 140/85mm/Hg or within value directed by their physician.   Intervention Provide nutrition & aerobic exercise along with prescribed medications to achieve BP 140/90 or less.   Lipids Yes   Goal Cholesterol controlled with medications as prescribed, with individualized exercise RX and with personalized nutrition plan. Value goals: LDL < 70mg , HDL > 40mg . Participant states understanding of desired cholesterol values and following  prescriptions.   Intervention Provide nutrition & aerobic exercise along with prescribed medications to achieve LDL 70mg , HDL >40mg .   Stress Yes   Goal To meet with psychosocial counselor for stress and relaxation information and guidance. To state understanding of performing relaxation techniques and or identifying personal stressors.   Intervention Provide education on types of stress, identifiying stressors, and ways to cope with stress. Provide demonstration and active practice of relaxation techniques.      Personal Goals and Risk Factors Review:    Personal Goals Discharge:     Comments: Initial ITP  Will start sessions soon

## 2014-12-03 DIAGNOSIS — I213 ST elevation (STEMI) myocardial infarction of unspecified site: Secondary | ICD-10-CM | POA: Diagnosis not present

## 2014-12-03 NOTE — Progress Notes (Signed)
Daily Session Note  Patient Details  Name: Kevin Moore MRN: [id number] Date of Birth: 07/16/1950 Referring Provider:  Rose, Gregory C, MD  Encounter Date: 12/03/2014  Check In:     Session Check In - 12/03/14 1629    Check-In   Staff Present Steven Way BS, ACSM EP-C, Exercise Physiologist;Mary Jo Abernethy RN;Diane Wright RN, BSN   ER physicians immediately available to respond to emergencies See telemetry face sheet for immediately available ER MD   Medication changes reported     No   Fall or balance concerns reported    No   Warm-up and Cool-down Performed on first and last piece of equipment   VAD Patient? No   Pain Assessment   Currently in Pain? No/denies         Goals Met:  Proper associated with RPD/PD & O2 Sat Exercise tolerated well No report of cardiac concerns or symptoms Strength training completed today  Goals Unmet:  Not Applicable  Goals Comments:    Dr. Mark Miller is Medical Director for [facility] and [facility]. 

## 2014-12-07 ENCOUNTER — Encounter: Payer: Self-pay | Admitting: *Deleted

## 2014-12-07 ENCOUNTER — Encounter: Payer: BLUE CROSS/BLUE SHIELD | Admitting: *Deleted

## 2014-12-07 DIAGNOSIS — I213 ST elevation (STEMI) myocardial infarction of unspecified site: Secondary | ICD-10-CM

## 2014-12-07 DIAGNOSIS — Z9861 Coronary angioplasty status: Secondary | ICD-10-CM

## 2014-12-07 NOTE — Progress Notes (Signed)
Daily Session Note  Patient Details  Name: Kevin Moore MRN: [id number] Date of Birth: 06-07-1950 Referring Provider:  [name], MD  Encounter Date: 12/07/2014  Check In:     Session Check In - 12/07/14 1614    Check-In   Staff Present Heath Lark RN, BSN, CCRP;Carroll Enterkin RN, BSN;Steven Way BS, ACSM EP-C, Exercise Physiologist   ER physicians immediately available to respond to emergencies See telemetry face sheet for immediately available ER MD   Medication changes reported     No   Fall or balance concerns reported    No   Warm-up and Cool-down Performed on first and last piece of equipment   VAD Patient? No   Pain Assessment   Currently in Pain? No/denies           Exercise Prescription Changes - 12/07/14 0640    Exercise Review   Progression No  Only one visit recorded   Response to Exercise   Blood Pressure (Admit) 146/92 mmHg   Blood Pressure (Exercise) 182/90 mmHg   Blood Pressure (Exit) 172/80 mmHg   Heart Rate (Admit) 70 bpm   Heart Rate (Exercise) 119 bpm   Heart Rate (Exit) 60 bpm   Rating of Perceived Exertion (Exercise) 11   Symptoms Yes   Comments PVCs and Bigeminy. Stopped on treadmill due to rhythm and finished on recumbent equipment   Duration Progress to 30 minutes of continuous aerobic without signs/symptoms of physical distress   Intensity Rest + 30   Progression Continue progressive overload as per policy without signs/symptoms or physical distress.   Resistance Training   Training Prescription Yes   Weight 2   Reps 10-15   Interval Training   Interval Training No   Treadmill   MPH 2.5   Grade 0   Minutes 10   NuStep   Level 3   Watts 50   Minutes 15   REL-XR   Level 2   Watts 55   Minutes 15      Goals Met:  Exercise tolerated well No report of cardiac concerns or symptoms  Goals Unmet:  Not Applicable  Goals Comments: No ectopics seen today, exercised lightly .   Dr. Emily Filbert is Medical Director  for [facility] and [facility].

## 2014-12-09 ENCOUNTER — Encounter: Payer: BLUE CROSS/BLUE SHIELD | Admitting: *Deleted

## 2014-12-09 ENCOUNTER — Encounter: Payer: Self-pay | Admitting: *Deleted

## 2014-12-09 DIAGNOSIS — I213 ST elevation (STEMI) myocardial infarction of unspecified site: Secondary | ICD-10-CM | POA: Diagnosis not present

## 2014-12-09 DIAGNOSIS — Z9861 Coronary angioplasty status: Secondary | ICD-10-CM

## 2014-12-09 NOTE — Progress Notes (Signed)
Cardiac Individual Treatment Plan  Patient Details  Name: Kevin Moore MRN: [id number] Date of Birth: [birth year] Referring Provider:  [name], MD  Initial Encounter Date:    Visit Diagnosis: ST elevation myocardial infarction (STEMI), unspecified artery  S/P PTCA (percutaneous transluminal coronary angioplasty)  Patient's Home Medications on Admission:  Current outpatient prescriptions:  .  aspirin 81 MG tablet, Take by mouth., Disp: , Rfl:  .  atorvastatin (LIPITOR) 80 MG tablet, Take 80 mg by mouth., Disp: , Rfl:  .  losartan (COZAAR) 100 MG tablet, Take 1 tablet (100 mg total) by mouth daily., Disp: 90 tablet, Rfl: 3 .  omeprazole (PRILOSEC) 20 MG capsule, Take by mouth., Disp: , Rfl:  .  ticagrelor (BRILINTA) 90 MG TABS tablet, Take 90 mg by mouth., Disp: , Rfl:   Past Medical History: No past medical history on file.  Tobacco Use: History  Smoking status  . Never Smoker   Smokeless tobacco  . Not on file    Labs: Recent Review Flowsheet Data    Labs for ITP Cardiac and Pulmonary Rehab Latest Ref Rng 06/11/2014   Cholestrol 0 - 200 mg/dL 232(A)   LDLCALC - 167   HDL 35 - 70 mg/dL 46   Trlycerides 40 - 160 mg/dL 94       Exercise Target Goals:    Exercise Program Goal: Individual exercise prescription set with THRR, safety & activity barriers. Participant demonstrates ability to understand and report RPE using BORG scale, to self-measure pulse accurately, and to acknowledge the importance of the exercise prescription.  Exercise Prescription Goal: Starting with aerobic activity 30 plus minutes a day, 3 days per week for initial exercise prescription. Provide home exercise prescription and guidelines that participant acknowledges understanding prior to discharge.  Activity Barriers & Risk Stratification:     Activity Barriers & Risk Stratification - 12/01/14 0830    Activity Barriers & Risk Stratification   Activity Barriers Joint  Problems;Deconditioning   Risk Stratification Moderate      6 Minute Walk:     6 Minute Walk      12/01/14 1020       6 Minute Walk   Phase Initial     Distance 1675 feet     Walk Time 6 minutes     Resting HR 48 bpm     Resting BP 182/90 mmHg     Max Ex. HR 84 bpm     Max Ex. BP 188/90 mmHg     RPE 11     Symptoms No        Initial Exercise Prescription:     Initial Exercise Prescription - 12/01/14 1000    Date of Initial Exercise Prescription   Date 12/01/14   Treadmill   MPH 2.8   Grade 0   Minutes 15   Bike   Level 0.4   Minutes 10   Recumbant Bike   Level 3   RPM 45   Watts 25   Minutes 15   NuStep   Level 3   Watts 40   Minutes 15   Arm Ergometer   Level 1   Watts 8   Minutes 10   Arm/Foot Ergometer   Level 4   Watts 15   Minutes 10   Cybex   Level 1   RPM 60   Minutes 10   Recumbant Elliptical   Level 1   RPM 40   Watts 10   Minutes 10  Elliptical   Level 1   Speed 3   Minutes 1   REL-XR   Level 2   Watts 35   Minutes 15   Prescription Details   Frequency (times per week) 3   Duration Progress to 30 minutes of continuous aerobic without signs/symptoms of physical distress   Intensity   THRR REST +  30   Ratings of Perceived Exertion 11-15   Progression Continue progressive overload as per policy without signs/symptoms or physical distress.   Resistance Training   Training Prescription Yes   Weight 2   Reps 10-15      Exercise Prescription Changes:     Exercise Prescription Changes      12/07/14 0600 12/07/14 0640         Exercise Review   Progression No  Only one visit recorded No  Only one visit recorded      Response to Exercise   Blood Pressure (Admit)  146/92 mmHg      Blood Pressure (Exercise)  182/90 mmHg      Blood Pressure (Exit)  172/80 mmHg      Heart Rate (Admit)  70 bpm      Heart Rate (Exercise)  119 bpm      Heart Rate (Exit)  60 bpm      Rating of Perceived Exertion (Exercise)  11       Symptoms  Yes      Comments  PVCs and Bigeminy. Stopped on treadmill due to rhythm and finished on recumbent equipment      Duration  Progress to 30 minutes of continuous aerobic without signs/symptoms of physical distress      Intensity  Rest + 30      Progression  Continue progressive overload as per policy without signs/symptoms or physical distress.      Resistance Training   Training Prescription  Yes      Weight  2      Reps  10-15      Interval Training   Interval Training  No      Treadmill   MPH  2.5      Grade  0      Minutes  10      NuStep   Level  3      Watts  50      Minutes  15      REL-XR   Level  2      Watts  55      Minutes  15         Discharge Exercise Prescription (Final Exercise Prescription Changes):     Exercise Prescription Changes - 12/07/14 0640    Exercise Review   Progression No  Only one visit recorded   Response to Exercise   Blood Pressure (Admit) 146/92 mmHg   Blood Pressure (Exercise) 182/90 mmHg   Blood Pressure (Exit) 172/80 mmHg   Heart Rate (Admit) 70 bpm   Heart Rate (Exercise) 119 bpm   Heart Rate (Exit) 60 bpm   Rating of Perceived Exertion (Exercise) 11   Symptoms Yes   Comments PVCs and Bigeminy. Stopped on treadmill due to rhythm and finished on recumbent equipment   Duration Progress to 30 minutes of continuous aerobic without signs/symptoms of physical distress   Intensity Rest + 30   Progression Continue progressive overload as per policy without signs/symptoms or physical distress.   Resistance Training   Training Prescription Yes   Weight 2   Reps  10-15   Interval Training   Interval Training No   Treadmill   MPH 2.5   Grade 0   Minutes 10   NuStep   Level 3   Watts 50   Minutes 15   REL-XR   Level 2   Watts 55   Minutes 15      Nutrition:  Target Goals: Understanding of nutrition guidelines, daily intake of sodium <1529m, cholesterol <2082m calories 30% from fat and 7% or less from saturated  fats, daily to have 5 or more servings of fruits and vegetables.  Biometrics:     Pre Biometrics - 12/01/14 1041    Pre Biometrics   Height 6' 1.88" (1.877 m)   Weight 213 lb 3.2 oz (96.707 kg)   BMI (Calculated) 27.5       Nutrition Therapy Plan and Nutrition Goals:   Nutrition Discharge: Rate Your Plate Scores:   Nutrition Goals Re-Evaluation:   Psychosocial: Target Goals: Acknowledge presence or absence of depression, maximize coping skills, provide positive support system. Participant is able to verbalize types and ability to use techniques and skills needed for reducing stress and depression.  Initial Review & Psychosocial Screening:     Initial Psych Review & Screening - 12/01/14 12IgnacioYes   Barriers   Psychosocial barriers to participate in program There are no identifiable barriers or psychosocial needs.;The patient should benefit from training in stress management and relaxation.   Screening Interventions   Interventions Encouraged to exercise;Program counselor consult      Quality of Life Scores:     Quality of Life - 12/01/14 1500    Quality of Life Scores   Health/Function Pre 29.2 %   Socioeconomic Pre 28.32 %   Psych/Spiritual Pre 28.93 %   Family Pre 30 %   GLOBAL Pre 29.06 %      PHQ-9:     Recent Review Flowsheet Data    Dep[facility] screen [facility] 12/01/2014   Decreased Interest 0   Down, Depressed, Hopeless 0   PHQ - 2 Score 0   Altered sleeping 0   Tired, decreased energy 0   Change in appetite 0   Feeling bad or failure about yourself  0   Trouble concentrating 0   Moving slowly or fidgety/restless 0   Suicidal thoughts 0   PHQ-9 Score 0      Psychosocial Evaluation and Intervention:     Psychosocial Evaluation - 12/07/14 1657    Psychosocial Evaluation & Interventions   Interventions Stress management education;Relaxation education;Encouraged to exercise with the program and follow  exercise prescription   Comments Counselor met with Mr. TrBattenoday for initial psychosocial evaluation.  He is a [age]ear old who had a heart attack seven weeks ago with (3) stents inserted.  He has a strong support system with a spouse of 4075ears and adult children who live close by.  He is also actively involved in is faith community.  Mr. TrIshidaeports he is typically in good health and sleeps and eats well.  He denies a history of depression or current symptoms and admits to a history of "situational" anxiety which was prescribed medication for awhile back, but reports it wasn't effective and doesn't take anymore.  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 Counselor educated him on some stress management and encouraged to attend the educational class on this in this  program. He has goals for increasing his strength and stamina and improving his diet and nutritional intake.  He admits to never having done aerobic exercise.  He is encouraged to consistently begin doing so and to meet with the dietician to accomplish his nutritional goals.   Continued Psychosocial Services Needed Yes  Mr. Mura will benefit from the stress management psychoeducational component of this program as well as engage in consistent exercise.       Psychosocial Re-Evaluation:   Vocational Rehabilitation: Provide vocational rehab assistance to qualifying candidates.   Vocational Rehab Evaluation & Intervention:     Vocational Rehab - 12/01/14 [phone]    Initial Vocational Rehab Evaluation & Intervention   Assessment shows need for Vocational Rehabilitation No      Education: Education Goals: Education classes will be provided on a weekly basis, covering required topics. Participant will state understanding/return demonstration of topics presented.  Learning Barriers/Preferences:     Learning Barriers/Preferences - 12/01/14 5277    Learning  Barriers/Preferences   Learning Barriers None   Learning Preferences Written Material      Education Topics: General Nutrition Guidelines/Fats and Fiber: -Group instruction provided by verbal, written material, models and posters to present the general guidelines for heart healthy nutrition. Gives an explanation and review of dietary fats and fiber.   Controlling Sodium/Reading Food Labels: -Group verbal and written material supporting the discussion of sodium use in heart healthy nutrition. Review and explanation with models, verbal and written materials for utilization of the food label.   Exercise Physiology & Risk Factors: - Group verbal and written instruction with models to review the exercise physiology of the cardiovascular system and associated critical values. Details cardiovascular disease risk factors and the goals associated with each risk factor.   Aerobic Exercise & Resistance Training: - Gives group verbal and written discussion on the health impact of inactivity. On the components of aerobic and resistive training programs and the benefits of this training and how to safely progress through these programs.          Cardiac Rehab from 12/07/2014 in [facility]   Date  12/07/14   Educator  SW   Instruction Review Code  2- meets goals/outcomes      Flexibility, Balance, General Exercise Guidelines: - Provides group verbal and written instruction on the benefits of flexibility and balance training programs. Provides general exercise guidelines with specific guidelines to those with heart or lung disease. Demonstration and skill practice provided.   Stress Management: - Provides group verbal and written instruction about the health risks of elevated stress, cause of high stress, and healthy ways to reduce stress.   Depression: - Provides group verbal and written instruction on the correlation between heart/lung disease and depressed mood, treatment options,  and the stigmas associated with seeking treatment.   Anatomy & Physiology of the Heart: - Group verbal and written instruction and models provide basic cardiac anatomy and physiology, with the coronary electrical and arterial systems. Review of: AMI, Angina, Valve disease, Heart Failure, Cardiac Arrhythmia, Pacemakers, and the ICD.   Cardiac Procedures: - Group verbal and written instruction and models to describe the testing methods done to diagnose heart disease. Reviews the outcomes of the test results. Describes the treatment choices: Medical Management, Angioplasty, or Coronary Bypass Surgery.   Cardiac Medications: - Group verbal and written instruction to review commonly prescribed medications for heart disease. Reviews the medication, class of the drug, and side effects. Includes the steps to properly store  meds and maintain the prescription regimen.   Go Sex-Intimacy & Heart Disease, Get SMART - Goal Setting: - Group verbal and written instruction through game format to discuss heart disease and the return to sexual intimacy. Provides group verbal and written material to discuss and apply goal setting through the application of the S.M.A.R.T. Method.   Other Matters of the Heart: - Provides group verbal, written materials and models to describe Heart Failure, Angina, Valve Disease, and Diabetes in the realm of heart disease. Includes description of the disease process and treatment options available to the cardiac patient.   Exercise & Equipment Safety: - Individual verbal instruction and demonstration of equipment use and safety with use of the equipment.      Cardiac Rehab from 12/07/2014 in [facility]   Date  12/01/14   Educator  SB   Instruction Review Code  2- meets goals/outcomes      Infection Prevention: - Provides verbal and written material to individual with discussion of infection control including proper hand washing and proper equipment cleaning during  exercise session.      Cardiac Rehab from 12/07/2014 in [facility]   Date  12/01/14   Educator  SB   Instruction Review Code  2- meets goals/outcomes      Falls Prevention: - Provides verbal and written material to individual with discussion of falls prevention and safety.      Cardiac Rehab from 12/07/2014 in [facility] Card[facility] Rehab   Date  12/01/14   Educator  SB   Instruction Review Code  2- meets goals/outcomes      Diabetes: - Individual verbal and written instruction to review signs/symptoms of diabetes, desired ranges of glucose level fasting, after meals and with exercise. Advice that pre and post exercise glucose checks will be done for 3 sessions at entry of program.    Knowledge Questionnaire Score:     Knowledge Questionnaire Score - 12/01/14 1218    Knowledge Questionnaire Score   Pre Score 23/28      Personal Goals and Risk Factors at Admission:     Personal Goals and Risk Factors at Admission - 12/01/14 0842    Personal Goals and Risk Factors on Admission   Increase Aerobic Exercise and Physical Activity Yes;Sedentary   Intervention While in program, learn and follow the exercise prescription taught. Start at a low level workload and increase workload after able to maintain previous level for 30 minutes. Increase time before increasing intensity.   Intervention Provide exercise education and an individualized exercise prescription that will provide continued progressive overload as per policy without signs/symptoms of physical distress.   Take Less Medication Yes   Diabetes No   Hypertension Yes   Goal Participant will see blood pressure controlled within the values of 140/38m/Hg or within value directed by their physician.   Intervention Provide nutrition & aerobic exercise along with prescribed medications to achieve BP 140/90 or less.   Lipids Yes   Goal Cholesterol controlled with medications as prescribed, with individualized exercise RX and with  personalized nutrition plan. Value goals: LDL < 767m HDL > 4019mParticipant states understanding of desired cholesterol values and following prescriptions.   Intervention Provide nutrition & aerobic exercise along with prescribed medications to achieve LDL <67m29mDL >40mg58mStress Yes   Goal To meet with psychosocial counselor for stress and relaxation information and guidance. To state understanding of performing relaxation techniques and or identifying personal stressors.   Intervention Provide education on  types of stress, identifiying stressors, and ways to cope with stress. Provide demonstration and active practice of relaxation techniques.      Personal Goals and Risk Factors Review:    Personal Goals Discharge:     Comments: 30 day review Continue with current ITP . New  To Program has attended 2 sessions

## 2014-12-09 NOTE — Progress Notes (Signed)
Daily Session Note  Patient Details  Name: Kevin Moore MRN: [id number] Date of Birth: 04/06/1950 Referring Provider:  [name], MD  Encounter Date: 12/09/2014  Check In:     Session Check In - 12/09/14 1704    Check-In   Staff Present Nyoka Cowden RN;Graciella Arment Dillard Essex MS, ACSM CEP Exercise Physiologist;Carroll Enterkin RN, BSN   ER physicians immediately available to respond to emergencies See telemetry face sheet for immediately available ER MD   Medication changes reported     No   Fall or balance concerns reported    No   Warm-up and Cool-down Performed on first and last piece of equipment   VAD Patient? No   Pain Assessment   Currently in Pain? No/denies   Multiple Pain Sites No         Goals Met:  Independence with exercise equipment Exercise tolerated well No report of cardiac concerns or symptoms Strength training completed today  Goals Unmet:  Not Applicable  Goals Comments: [name] had no PVCs today and only performed recumbent equipment in order to decrease likelihood of going into bigeminy. We are still waiting to hear back from his MD on the action he wishes [place] to take for [facility] during exercise.    Dr. Emily Filbert is Medical Director for [facility] and [facility].

## 2014-12-10 DIAGNOSIS — I213 ST elevation (STEMI) myocardial infarction of unspecified site: Secondary | ICD-10-CM

## 2014-12-10 NOTE — Progress Notes (Signed)
Daily Session Note  Patient Details  Name: Kevin Moore MRN: [id number] Date of Birth: 1951-01-02 Referring Provider:  [name], MD  Encounter Date: 12/10/2014  Check In:     Session Check In - 12/10/14 1612    Check-In   Staff Present Lestine Box BS, ACSM EP-C, Exercise Physiologist;Mary Kellie Shropshire RN;Carroll Enterkin RN, BSN   ER physicians immediately available to respond to emergencies See telemetry face sheet for immediately available ER MD   Medication changes reported     No   Fall or balance concerns reported    No   Warm-up and Cool-down Performed on first and last piece of equipment   VAD Patient? No   Pain Assessment   Currently in Pain? No/denies         Goals Met:  Proper associated with RPD/PD & O2 Sat Exercise tolerated well No report of cardiac concerns or symptoms Strength training completed today  Goals Unmet:  Not Applicable  Goals Comments:    Dr. Emily Filbert is Medical Director for [place] and [facility].

## 2014-12-14 ENCOUNTER — Encounter: Payer: BLUE CROSS/BLUE SHIELD | Admitting: *Deleted

## 2014-12-14 DIAGNOSIS — I213 ST elevation (STEMI) myocardial infarction of unspecified site: Secondary | ICD-10-CM | POA: Diagnosis not present

## 2014-12-14 DIAGNOSIS — Z9861 Coronary angioplasty status: Secondary | ICD-10-CM

## 2014-12-14 NOTE — Progress Notes (Signed)
Daily Session Note  Patient Details  Name: BREES HOUNSHELL MRN: [id number] Date of Birth: January 23, 1951 Referring Provider:  [name], MD  Encounter Date: 12/14/2014  Check In:     Session Check In - 12/14/14 1740    Check-In   Staff Present Heath Lark RN, BSN, CCRP;Kelly Hayes BS, ACSM CEP Exercise Physiologist;Steven Way BS, ACSM EP-C, Exercise Physiologist   ER physicians immediately available to respond to emergencies See telemetry face sheet for immediately available ER MD   Medication changes reported     No   Fall or balance concerns reported    No   Warm-up and Cool-down Performed on first and last piece of equipment   VAD Patient? No   Pain Assessment   Currently in Pain? No/denies   Multiple Pain Sites No         Goals Met:  Independence with exercise equipment Exercise tolerated well No report of cardiac concerns or symptoms Strength training completed today  Goals Unmet:  Not Applicable  Goals Comments:    Dr. Emily Filbert is Medical Director for [place] and [facility].

## 2014-12-16 ENCOUNTER — Encounter: Payer: BLUE CROSS/BLUE SHIELD | Admitting: *Deleted

## 2014-12-16 DIAGNOSIS — I213 ST elevation (STEMI) myocardial infarction of unspecified site: Secondary | ICD-10-CM | POA: Diagnosis not present

## 2014-12-16 DIAGNOSIS — Z9861 Coronary angioplasty status: Secondary | ICD-10-CM

## 2014-12-16 NOTE — Progress Notes (Signed)
Daily Session Note  Patient Details  Name: DONEVAN BILLER MRN: [id number] Date of Birth: 08-31-1950 Referring Provider:  [name], MD  Encounter Date: 12/16/2014  Check In:     Session Check In - 12/16/14 1603    Check-In   Staff Present Candiss Norse MS, ACSM CEP Exercise Physiologist;Jarius Dieudonne Joya Gaskins RN, BSN;Carroll Enterkin RN, BSN   ER physicians immediately available to respond to emergencies See telemetry face sheet for immediately available ER MD   Medication changes reported     No   Fall or balance concerns reported    No   Warm-up and Cool-down Performed on first and last piece of equipment   VAD Patient? No   Pain Assessment   Currently in Pain? No/denies   Multiple Pain Sites No         Goals Met:  Exercise tolerated well No report of cardiac concerns or symptoms Strength training completed today  Goals Unmet:  Not Applicable  Goals Comments:  Very stable during exercise.  Discussed with patient adding treadmill or elliptical with next session.     Dr. Emily Filbert is Medical Director for [place] and [facility].

## 2014-12-17 DIAGNOSIS — I213 ST elevation (STEMI) myocardial infarction of unspecified site: Secondary | ICD-10-CM | POA: Diagnosis not present

## 2014-12-17 NOTE — Progress Notes (Signed)
Daily Session Note  Patient Details  Name: Kevin Moore MRN: [id number] Date of Birth: 04/03/1950 Referring Provider:  [name], MD  Encounter Date: 12/17/2014  Check In:     Session Check In - 12/17/14 1617    Check-In   Staff Present Lestine Box BS, ACSM EP-C, Exercise Physiologist;Carroll Enterkin RN, BSN;Diane Joya Gaskins RN, BSN   ER physicians immediately available to respond to emergencies See telemetry face sheet for immediately available ER MD   Medication changes reported     No   Fall or balance concerns reported    No   Warm-up and Cool-down Performed on first and last piece of equipment   VAD Patient? No   Pain Assessment   Currently in Pain? No/denies         Goals Met:  Proper associated with RPD/PD & O2 Sat Exercise tolerated well No report of cardiac concerns or symptoms Strength training completed today  Goals Unmet:  Not Applicable  Goals Comments: [name] experienced some bigeminy on the [place]. After approximately 1 minute of exercise, in which he requested to stop and rest, and this resulted in return to normal.   Dr. Emily Filbert is Medical Director for [place] and [facility].

## 2014-12-21 DIAGNOSIS — I213 ST elevation (STEMI) myocardial infarction of unspecified site: Secondary | ICD-10-CM

## 2014-12-21 NOTE — Progress Notes (Signed)
Daily Session Note  Patient Details  Name: ADRICK Moore MRN: [id number] Date of Birth: Apr 11, 1950 Referring Provider:  [name], MD  Encounter Date: 12/21/2014  Check In:     Session Check In - 12/21/14 1617    Check-In   Staff Present Heath Lark RN, BSN, CCRP;Steven Way BS, ACSM EP-C, Exercise Physiologist;Carroll Radio producer, BSN   ER physicians immediately available to respond to emergencies See telemetry face sheet for immediately available ER MD   Medication changes reported     No   Fall or balance concerns reported    No   Warm-up and Cool-down Performed on first and last piece of equipment   VAD Patient? No   Pain Assessment   Currently in Pain? No/denies         Goals Met:  Proper associated with RPD/PD & O2 Sat Exercise tolerated well No report of cardiac concerns or symptoms Strength training completed today  Goals Unmet:  Not Applicable  Goals Comments:    Dr. Emily Filbert is Medical Director for [place] and [facility].

## 2014-12-23 ENCOUNTER — Encounter: Payer: BLUE CROSS/BLUE SHIELD | Admitting: *Deleted

## 2014-12-23 DIAGNOSIS — I213 ST elevation (STEMI) myocardial infarction of unspecified site: Secondary | ICD-10-CM | POA: Diagnosis not present

## 2014-12-23 DIAGNOSIS — Z9861 Coronary angioplasty status: Secondary | ICD-10-CM

## 2014-12-23 NOTE — Progress Notes (Signed)
Daily Session Note  Patient Details  Name: Kevin Moore MRN: [id number] Date of Birth: 20-Oct-1950 Referring Provider:  [name], MD  Encounter Date: 12/23/2014  Check In:     Session Check In - 12/23/14 1754    Check-In   Staff Present Gerlene Burdock RN, BSN;Yara Tomkinson Dillard Essex MS, ACSM CEP Exercise Physiologist;Diane Mariana Arn, BSN   ER physicians immediately available to respond to emergencies See telemetry face sheet for immediately available ER MD   Medication changes reported     No   Fall or balance concerns reported    No   Warm-up and Cool-down Performed on first and last piece of equipment   VAD Patient? No   Pain Assessment   Currently in Pain? No/denies   Multiple Pain Sites No           Exercise Prescription Changes - 12/23/14 1700    Exercise Review   Progression Yes   Response to Exercise   Symptoms None   Comments Reviewed individualized exercise prescription and made increases per departmental policy. Exercise increases were discussed with the patient and they were able to perform the new work loads without issue (no signs or symptoms). [name] was very compliant with the interval training and did very well with the first program. An interval program card was put in his chart and he will use this in every class.   Duration Progress to 30 minutes of continuous aerobic without signs/symptoms of physical distress   Intensity Rest + 30   Progression Continue progressive overload as per policy without signs/symptoms or physical distress.   Resistance Training   Training Prescription Yes   Weight 2   Reps 10-15   Interval Training   Interval Training Yes   Equipment REL-XR   Comments L7/85 watts for 3 min; Level 10-11/120 watts for 1 min; repeat   Treadmill   MPH 2.5   Grade 0   Minutes 10   NuStep   Level 3   Watts 50   Minutes 15   REL-XR   Level 2   Watts 55   Minutes 15      Goals Met:  Proper associated with RPD/PD & O2  Sat Exercise tolerated well Personal goals reviewed No report of cardiac concerns or symptoms Strength training completed today  Goals Unmet:  Not Applicable  Goals Comments: Reviewed individualized exercise prescription and made increases per departmental policy. Exercise increases were discussed with the patient and they were able to perform the new work loads without issue (no signs or symptoms).     Dr. Emily Filbert is Medical Director for [place] and [facility].

## 2014-12-24 DIAGNOSIS — I213 ST elevation (STEMI) myocardial infarction of unspecified site: Secondary | ICD-10-CM

## 2014-12-24 DIAGNOSIS — Z9861 Coronary angioplasty status: Secondary | ICD-10-CM

## 2014-12-24 NOTE — Progress Notes (Signed)
Daily Session Note  Patient Details  Name: Kevin Moore MRN: [id number] Date of Birth: June 13, 1950 Referring Provider:  [name], MD  Encounter Date: 12/24/2014  Check In:     Session Check In - 12/24/14 1703    Check-In   Staff Present Gerlene Burdock RN, BSN;Diane Joya Gaskins RN, BSN;Other   ER physicians immediately available to respond to emergencies See telemetry face sheet for immediately available ER MD   Medication changes reported     No   Fall or balance concerns reported    No   Warm-up and Cool-down Performed on first and last piece of equipment   VAD Patient? No   Pain Assessment   Currently in Pain? No/denies         Goals Met:  Independence with exercise equipment Exercise tolerated well No report of cardiac concerns or symptoms Strength training completed today  Goals Unmet:  Not Applicable  Goals Comments: [name] is progressing well with his exercise prescription.   Dr. Emily Filbert is Medical Director for [facility] and [facility].

## 2014-12-28 ENCOUNTER — Encounter: Payer: BLUE CROSS/BLUE SHIELD | Attending: Cardiology

## 2014-12-28 DIAGNOSIS — Z9861 Coronary angioplasty status: Secondary | ICD-10-CM | POA: Diagnosis present

## 2014-12-28 DIAGNOSIS — I213 ST elevation (STEMI) myocardial infarction of unspecified site: Secondary | ICD-10-CM | POA: Diagnosis not present

## 2014-12-28 NOTE — Progress Notes (Signed)
Daily Session Note  Patient Details  Name: Kevin Moore MRN: [id number] Date of Birth: January 29, 1951 Referring Provider:  [name], MD  Encounter Date: 12/28/2014  Check In:     Session Check In - 12/28/14 1709    Check-In   Staff Present Lestine Box BS, ACSM EP-C, Exercise Physiologist;Carroll Enterkin RN, BSN;Mary Kellie Shropshire RN   ER physicians immediately available to respond to emergencies See telemetry face sheet for immediately available ER MD   Medication changes reported     No   Fall or balance concerns reported    No   Warm-up and Cool-down Performed on first and last piece of equipment   VAD Patient? No   Pain Assessment   Currently in Pain? No/denies         Goals Met:  Proper associated with RPD/PD & O2 Sat Exercise tolerated well No report of cardiac concerns or symptoms Strength training completed today  Goals Unmet:  Not Applicable  Goals Comments:    Dr. Emily Filbert is Medical Director for [facility] and [facility].

## 2014-12-30 ENCOUNTER — Encounter: Payer: BLUE CROSS/BLUE SHIELD | Admitting: *Deleted

## 2014-12-30 DIAGNOSIS — I213 ST elevation (STEMI) myocardial infarction of unspecified site: Secondary | ICD-10-CM

## 2014-12-30 NOTE — Progress Notes (Signed)
Daily Session Note  Patient Details  Name: Kevin Moore MRN: [id number] Date of Birth: October 19, 1950 Referring Provider:  [name], MD  Encounter Date: 12/30/2014  Check In:     Session Check In - 12/30/14 1811    Check-In   Staff Present Diane Joya Gaskins RN, BSN;Natacia Chaisson Dillard Essex MS, ACSM CEP Exercise Physiologist;Carroll Enterkin RN, BSN   ER physicians immediately available to respond to emergencies See telemetry face sheet for immediately available ER MD   Medication changes reported     No   Fall or balance concerns reported    No   Warm-up and Cool-down Performed on first and last piece of equipment   VAD Patient? No   Pain Assessment   Currently in Pain? No/denies   Multiple Pain Sites No           Exercise Prescription Changes - 12/30/14 1800    Exercise Review   Progression Yes   Response to Exercise   Rating of Perceived Exertion (Exercise) 12   Symptoms None   Comments A new target heart rate range for exercise was calculated based on the patient's ability to exercise with increased intensity. The range is 40-85% of HRR and encompasses moderate-vigorous exercise. The target heart rate range is equivalent to an RPE of 12-17. [name] is exercising at home 2 d/wk in addition to HT. He is exericising on his bike or walking in his neighborhood for 30 minutes per session.    Duration Progress to 30 minutes of continuous aerobic without signs/symptoms of physical distress   Intensity Other (comment)  40-80% of HRR 99-149   Progression Continue progressive overload as per policy without signs/symptoms or physical distress.   Resistance Training   Training Prescription Yes   Weight 5   Reps 10-15   Interval Training   Interval Training Yes   Equipment REL-XR   Comments L7/85 watts for 3 min; Level 10-11/120 watts for 1 min; repeat   Treadmill   MPH 3   Grade 2   Minutes 20   NuStep   Level 3   Watts 50   Minutes 15   Elliptical   Level 2   Speed 3   Minutes 10   REL-XR   Level 2   Watts 55   Minutes 15      Goals Met:  Independence with exercise equipment Exercise tolerated well Personal goals reviewed No report of cardiac concerns or symptoms Strength training completed today  Goals Unmet:  Not Applicable  Goals Comments: A new target heart rate range for exercise was calculated based on the patient's ability to exercise with increased intensity. The range is 40-85% of HRR and encompasses moderate-vigorous exercise. The target heart rate range is equivalent to an RPE of 12-17.    Dr. Emily Filbert is Medical Director for [place] and [facility].

## 2014-12-31 DIAGNOSIS — I213 ST elevation (STEMI) myocardial infarction of unspecified site: Secondary | ICD-10-CM | POA: Diagnosis not present

## 2014-12-31 NOTE — Progress Notes (Signed)
Daily Session Note  Patient Details  Name: Kevin Moore MRN: [id number] Date of Birth: 1951/01/03 Referring Provider:  [name], MD  Encounter Date: 12/31/2014  Check In:     Session Check In - 12/31/14 1559    Check-In   Staff Present Lestine Box BS, ACSM EP-C, Exercise Physiologist;Diane Joya Gaskins RN, BSN;Mary Kellie Shropshire RN   ER physicians immediately available to respond to emergencies See telemetry face sheet for immediately available ER MD   Medication changes reported     No   Fall or balance concerns reported    No   Warm-up and Cool-down Performed on first and last piece of equipment   VAD Patient? No   Pain Assessment   Currently in Pain? No/denies           Exercise Prescription Changes - 12/30/14 1800    Exercise Review   Progression Yes   Response to Exercise   Rating of Perceived Exertion (Exercise) 12   Symptoms None   Comments A new target heart rate range for exercise was calculated based on the patient's ability to exercise with increased intensity. The range is 40-85% of HRR and encompasses moderate-vigorous exercise. The target heart rate range is equivalent to an RPE of 12-17. [name] is exercising at home 2 d/wk in addition to HT. He is exericising on his bike or walking in his neighborhood for 30 minutes per session.    Duration Progress to 30 minutes of continuous aerobic without signs/symptoms of physical distress   Intensity Other (comment)  40-80% of HRR 99-149   Progression Continue progressive overload as per policy without signs/symptoms or physical distress.   Resistance Training   Training Prescription Yes   Weight 5   Reps 10-15   Interval Training   Interval Training Yes   Equipment REL-XR   Comments L7/85 watts for 3 min; Level 10-11/120 watts for 1 min; repeat   Treadmill   MPH 3   Grade 2   Minutes 20   NuStep   Level 3   Watts 50   Minutes 15   Elliptical   Level 2   Speed 3   Minutes 10   REL-XR   Level 2   Watts 55   Minutes 15      Goals Met:  Proper associated with RPD/PD & O2 Sat Exercise tolerated well No report of cardiac concerns or symptoms Strength training completed today  Goals Unmet:  Not Applicable  Goals Comments:    Dr. Emily Filbert is Medical Director for [place] and [facility].

## 2015-01-04 DIAGNOSIS — I213 ST elevation (STEMI) myocardial infarction of unspecified site: Secondary | ICD-10-CM

## 2015-01-04 DIAGNOSIS — Z9861 Coronary angioplasty status: Secondary | ICD-10-CM

## 2015-01-04 NOTE — Progress Notes (Signed)
Daily Session Note  Patient Details  Name: Kevin Moore MRN: [id number] Date of Birth: Nov 20, 1950 Referring Provider:  [name], MD  Encounter Date: 01/04/2015  Check In:     Session Check In - 01/04/15 1633    Check-In   Staff Present Lestine Box BS, ACSM EP-C, Exercise Physiologist;Carroll Enterkin RN, BSN;Other   ER physicians immediately available to respond to emergencies See telemetry face sheet for immediately available ER MD   Medication changes reported     No   Fall or balance concerns reported    No   Warm-up and Cool-down Performed on first and last piece of equipment   VAD Patient? No   Pain Assessment   Currently in Pain? No/denies           Exercise Prescription Changes - 01/04/15 1600    Exercise Review   Progression Yes   Response to Exercise   Rating of Perceived Exertion (Exercise) 12   Symptoms None   Comments A new target heart rate range for exercise was calculated based on the patient's ability to exercise with increased intensity. The range is 40-85% of HRR and encompasses moderate-vigorous exercise. The target heart rate range is equivalent to an RPE of 12-17. Kevin Moore is exercising at home 2 d/wk in addition to HT. He is exericising on his bike or walking in his neighborhood for 30 minutes per session.    Duration Progress to 30 minutes of continuous aerobic without signs/symptoms of physical distress   Intensity Other (comment)  40-80% of HRR 99-149   Progression Continue progressive overload as per policy without signs/symptoms or physical distress.   Resistance Training   Training Prescription Yes   Weight 5   Reps 10-15   Interval Training   Interval Training Yes   Equipment REL-XR   Comments L8/80 watts for 3 min; Level 10-12/120 watts for 1 min; repeat   Treadmill   MPH 3   Grade 2   Minutes 20   NuStep   Level 3   Watts 50   Minutes 15   Elliptical   Level 2   Speed 3   Minutes 10   REL-XR   Level 2   Watts 55   Minutes 15      Goals Met:  Independence with exercise equipment Exercise tolerated well No report of cardiac concerns or symptoms Strength training completed today  Goals Unmet:  Not Applicable  Goals Comments:    Dr. Emily Filbert is Medical Director for [facility].

## 2015-01-06 DIAGNOSIS — I213 ST elevation (STEMI) myocardial infarction of unspecified site: Secondary | ICD-10-CM | POA: Diagnosis not present

## 2015-01-06 NOTE — Progress Notes (Signed)
Cardiac Individual Treatment Plan  Patient Details  Name: Kevin Moore MRN: [id number] Date of Birth: [birth year] Referring Provider:  [name], MD  Initial Encounter Date:    Visit Diagnosis: ST elevation myocardial infarction (STEMI), unspecified artery ([place])  Patient's Home Medications on Admission:  Current outpatient prescriptions:  .  aspirin 81 MG tablet, Take by mouth., Disp: , Rfl:  .  atorvastatin (LIPITOR) 80 MG tablet, Take 80 mg by mouth., Disp: , Rfl:  .  losartan (COZAAR) 100 MG tablet, Take 1 tablet (100 mg total) by mouth daily., Disp: 90 tablet, Rfl: 3 .  omeprazole (PRILOSEC) 20 MG capsule, Take by mouth., Disp: , Rfl:  .  ticagrelor (BRILINTA) 90 MG TABS tablet, Take 90 mg by mouth., Disp: , Rfl:   Past Medical History: No past medical history on file.  Tobacco Use: History  Smoking status  . Never Smoker   Smokeless tobacco  . Not on file    Labs: Recent Review Flowsheet Data    Labs for ITP Cardiac and Pulmonary Rehab Latest Ref Rng 06/11/2014   Cholestrol 0 - 200 mg/dL 232(A)   LDLCALC - 167   HDL 35 - 70 mg/dL 46   Trlycerides 40 - 160 mg/dL 94       Exercise Target Goals:    Exercise Program Goal: Individual exercise prescription set with THRR, safety & activity barriers. Participant demonstrates ability to understand and report RPE using BORG scale, to self-measure pulse accurately, and to acknowledge the importance of the exercise prescription.  Exercise Prescription Goal: Starting with aerobic activity 30 plus minutes a day, 3 days per week for initial exercise prescription. Provide home exercise prescription and guidelines that participant acknowledges understanding prior to discharge.  Activity Barriers & Risk Stratification:     Activity Barriers & Risk Stratification - 12/01/14 0830    Activity Barriers & Risk Stratification   Activity Barriers Joint Problems;Deconditioning   Risk Stratification Moderate      6  Minute Walk:     6 Minute Walk      12/01/14 1020       6 Minute Walk   Phase Initial     Distance 1675 feet     Walk Time 6 minutes     Resting HR 48 bpm     Resting BP 182/90 mmHg     Max Ex. HR 84 bpm     Max Ex. BP 188/90 mmHg     RPE 11     Symptoms No        Initial Exercise Prescription:     Initial Exercise Prescription - 12/01/14 1000    Date of Initial Exercise Prescription   Date 12/01/14   Treadmill   MPH 2.8   Grade 0   Minutes 15   Bike   Level 0.4   Minutes 10   Recumbant Bike   Level 3   RPM 45   Watts 25   Minutes 15   NuStep   Level 3   Watts 40   Minutes 15   Arm Ergometer   Level 1   Watts 8   Minutes 10   Arm/Foot Ergometer   Level 4   Watts 15   Minutes 10   Cybex   Level 1   RPM 60   Minutes 10   Recumbant Elliptical   Level 1   RPM 40   Watts 10   Minutes 10   Elliptical   Level 1  Speed 3   Minutes 1   REL-XR   Level 2   Watts 35   Minutes 15   Prescription Details   Frequency (times per week) 3   Duration Progress to 30 minutes of continuous aerobic without signs/symptoms of physical distress   Intensity   THRR REST +  30   Ratings of Perceived Exertion 11-15   Progression Continue progressive overload as per policy without signs/symptoms or physical distress.   Resistance Training   Training Prescription Yes   Weight 2   Reps 10-15      Exercise Prescription Changes:     Exercise Prescription Changes      12/07/14 0600 12/07/14 0640 12/23/14 1700 12/24/14 1700 12/29/14 0800   Exercise Review   Progression No  Only one visit recorded No  Only one visit recorded Yes  Yes   Response to Exercise   Blood Pressure (Admit)  146/92 mmHg   146/82 mmHg   Blood Pressure (Exercise)  182/90 mmHg   134/78 mmHg   Blood Pressure (Exit)  172/80 mmHg   108/62 mmHg   Heart Rate (Admit)  70 bpm   61 bpm   Heart Rate (Exercise)  119 bpm   72 bpm   Heart Rate (Exit)  60 bpm   59 bpm   Rating of Perceived Exertion  (Exercise)  11   12   Symptoms  Yes None  None   Comments  PVCs and [place]. Stopped on treadmill due to rhythm and finished on recumbent equipment Reviewed individualized exercise prescription and made increases per departmental policy. Exercise increases were discussed with the patient and they were able to perform the new work loads without issue (no signs or symptoms). Rodell was very compliant with the interval training and did very well with the first program. An interval program card was put in his chart and he will use this in every class. Given info about the Independent gym and also Alfonso given info about the [employer] program for when he finishes Cardiac rehab.  Discussed adding one day a week of exercise at home in addition to the three days a week at [facility]. Explained that 150 minutes a week of exercise is the goal for combating and managing chronic health conditions. This volume of exercise is also proven to give other health benefits.    Frequency     Add 2 additional days to program exercise sessions.   Duration  Progress to 30 minutes of continuous aerobic without signs/symptoms of physical distress Progress to 30 minutes of continuous aerobic without signs/symptoms of physical distress  Progress to 30 minutes of continuous aerobic without signs/symptoms of physical distress   Intensity  Rest + 30 Rest + 30  Rest + 30   Progression  Continue progressive overload as per policy without signs/symptoms or physical distress. Continue progressive overload as per policy without signs/symptoms or physical distress.  Continue progressive overload as per policy without signs/symptoms or physical distress.   Resistance Training   Training Prescription  Yes Yes  Yes   Weight  _0 Reps  10-15 10-15  10-15   Interval Training   Interval Training  No Yes  Yes   Equipment   REL-XR  REL-XR   Comments   L7/85 watts for 3 min; Level 10-11/120 watts for 1 min; repeat  L7/85 watts for 3  min; Level 10-11/120 watts for 1 min; repeat   Treadmill   MPH  2.5 2.5  3   Grade  0 0  2   Minutes  _0 NuStep   Level  _1 Watts  50 50  50   Minutes  _2 Elliptical   Level     2   Speed     3   Minutes     10   REL-XR   Level  _3 Watts  55 55  55   Minutes  _4 12/30/14 1800 01/04/15 1600         Exercise Review   Progression Yes Yes      Response to Exercise   Rating of Perceived Exertion (Exercise) 12 12      Symptoms None None      Comments A new target heart rate range for exercise was calculated based on the patient's ability to exercise with increased intensity. The range is 40-85% of HRR and encompasses moderate-vigorous exercise. The target heart rate range is equivalent to an RPE of 12-17. Shiven is exercising at home 2 d/wk in addition to HT. He is exericising on his bike or walking in his neighborhood for 30 minutes per session.  A new target heart rate range for exercise was calculated based on the patient's ability to exercise with increased intensity. The range is 40-85% of HRR and encompasses moderate-vigorous exercise. The target heart rate range is equivalent to an RPE of 12-17. Addis is exercising at home 2 d/wk in addition to HT. He is exericising on his bike or walking in his neighborhood for 30 minutes per session.       Duration Progress to 30 minutes of continuous aerobic without signs/symptoms of physical distress Progress to 30 minutes of continuous aerobic without signs/symptoms of physical distress      Intensity Other (comment)  40-80% of HRR 99-149 Other (comment)  40-80% of HRR 99-149      Progression Continue progressive overload as per policy without signs/symptoms or physical distress. Continue progressive overload as per policy without signs/symptoms or physical distress.      Resistance Training   Training Prescription Yes Yes      Weight 5 5      Reps 10-15 10-15      Interval Training   Interval  Training Yes Yes      Equipment REL-XR REL-XR      Comments L7/85 watts for 3 min; Level 10-11/120 watts for 1 min; repeat L8/80 watts for 3 min; Level 10-12/120 watts for 1 min; repeat      Treadmill   MPH 3 3      Grade 2 2      Minutes 20 20      NuStep   Level 3 3      Watts 50 50      Minutes 15 15      Elliptical   Level 2 2      Speed 3 3      Minutes 10 10      REL-XR   Level 2 2      Watts 55 55      Minutes 15 15         Discharge Exercise Prescription (Final Exercise Prescription Changes):     Exercise Prescription Changes - 01/04/15 1600    Exercise Review   Progression Yes   Response to  Exercise   Rating of Perceived Exertion (Exercise) 12   Symptoms None   Comments A new target heart rate range for exercise was calculated based on the patient's ability to exercise with increased intensity. The range is 40-85% of HRR and encompasses moderate-vigorous exercise. The target heart rate range is equivalent to an RPE of 12-17. Davin is exercising at home 2 d/wk in addition to HT. He is exericising on his bike or walking in his neighborhood for 30 minutes per session.    Duration Progress to 30 minutes of continuous aerobic without signs/symptoms of physical distress   Intensity Other (comment)  40-80% of HRR 99-149   Progression Continue progressive overload as per policy without signs/symptoms or physical distress.   Resistance Training   Training Prescription Yes   Weight 5   Reps 10-15   Interval Training   Interval Training Yes   Equipment REL-XR   Comments L8/80 watts for 3 min; Level 10-12/120 watts for 1 min; repeat   Treadmill   MPH 3   Grade 2   Minutes 20   NuStep   Level 3   Watts 50   Minutes 15   Elliptical   Level 2   Speed 3   Minutes 10   REL-XR   Level 2   Watts 55   Minutes 15      Nutrition:  Target Goals: Understanding of nutrition guidelines, daily intake of sodium <1563m, cholesterol <2032m calories 30% from fat and 7%  or less from saturated fats, daily to have 5 or more servings of fruits and vegetables.  Biometrics:     Pre Biometrics - 12/01/14 1041    Pre Biometrics   Height 6' 1.88" (1.877 m)   Weight 213 lb 3.2 oz (96.707 kg)   BMI (Calculated) 27.5       Nutrition Therapy Plan and Nutrition Goals:     Nutrition Therapy & Goals - 12/11/14 0901    Nutrition Therapy   Diet DASH   Drug/Food Interactions Statins/Certain Fruits   Fiber 30 grams   Whole Grain Foods 3 servings   Protein 8 ounces/day   Saturated Fats 14 max. grams   Fruits and Vegetables 8 servings/day   Personal Nutrition Goals   Personal Goal #1 Continue with heart healthy eating pattern.       Nutrition Discharge: Rate Your Plate Scores:     Rate Your Plate - 0961/44/3195400  Rate Your Plate Scores   Pre Score 70   Pre Score % 78 %      Nutrition Goals Re-Evaluation:     Nutrition Goals Re-Evaluation      12/21/14 1759 12/21/14 1802         Personal Goal #1 Re-Evaluation   Personal Goal #1 Has met with dietician and cutting back on Fats. Is glad to know he can eat more than grilled chicken like healthy fast food choices even.        Comments  I discussed caffeine intake with SpFrederico Hammano try to prevent PVC but he said MD said to drink some caffeine to help with shortness of breath that he feels with [facility]ili[facility] medicine.       Personal Goal #2 Re-Evaluation   Personal Goal #2 SpHerculestated he has eaten healthy in his later life but his early life eating habits are catching up with him.           Psychosocial: Target Goals: Acknowledge presence or absence of depression, maximize  coping skills, provide positive support system. Participant is able to verbalize types and ability to use techniques and skills needed for reducing stress and depression.  Initial Review & Psychosocial Screening:     Initial Psych Review & Screening - 12/01/14 [place]? Yes   Barriers    Psychosocial barriers to participate in program There are no identifiable barriers or psychosocial needs.;The patient should benefit from training in stress management and relaxation.   Screening Interventions   Interventions Encouraged to exercise;Program counselor consult      Quality of Life Scores:     Quality of Life - 12/01/14 1500    Quality of Life Scores   Health/Function Pre 29.2 %   Socioeconomic Pre 28.32 %   Psych/Spiritual Pre 28.93 %   Family Pre 30 %   GLOBAL Pre 29.06 %      PHQ-9:     Recent Review Flowsheet Data    Depression screen [facility] 2/9 12/01/2014   Decreased Interest 0   Down, Depressed, Hopeless 0   PHQ - 2 Score 0   Altered sleeping 0   Tired, decreased energy 0   Change in appetite 0   Feeling bad or failure about yourself  0   Trouble concentrating 0   Moving slowly or fidgety/restless 0   Suicidal thoughts 0   PHQ-9 Score 0      Psychosocial Evaluation and Intervention:     Psychosocial Evaluation - 12/07/14 1657    Psychosocial Evaluation & Interventions   Interventions Stress management education;Relaxation education;Encouraged to exercise with the program and follow exercise prescription   Comments Counselor met with Mr. Fehringer today for initial psychosocial evaluation.  He is a 64 year old who had a heart attack seven weeks ago with (3) stents inserted.  He has a strong support system with a spouse of 21 years and adult children who live close by.  He is also actively involved in is faith community.  Mr. Pautsch reports he is typically in good health and sleeps and eats well.  He denies a history of depression or current symptoms and admits to a history of "situational" anxiety which was prescribed medication for awhile back, but reports it wasn't effective and doesn't take anymore.  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 Counselor educated him on some stress management and encouraged to  attend the educational class on this in this program. He has goals for increasing his strength and stamina and improving his diet and nutritional intake.  He admits to never having done aerobic exercise.  He is encouraged to consistently begin doing so and to meet with the dietician to accomplish his nutritional goals.   Continued Psychosocial Services Needed Yes  Mr. Beach will benefit from the stress management psychoeducational component of this program as well as engage in consistent exercise.       Psychosocial Re-Evaluation:     Psychosocial Re-Evaluation      12/21/14 1802           Psychosocial Re-Evaluation   Interventions Encouraged to attend Cardiac Rehabilitation for the exercise       Comments Xavi said it is stressful to feel the PVC's he does. He said he walks fast at work and doesn't pay any mind to them.           Vocational Rehabilitation: Provide vocational rehab assistance to qualifying candidates.  Vocational Rehab Evaluation & Intervention:     Vocational Rehab - 12/01/14 [id number]    Initial Vocational Rehab Evaluation & Intervention   Assessment shows need for Vocational Rehabilitation No      Education: Education Goals: Education classes will be provided on a weekly basis, covering required topics. Participant will state understanding/return demonstration of topics presented.  Learning Barriers/Preferences:     Learning Barriers/Preferences - 12/01/14 6759    Learning Barriers/Preferences   Learning Barriers None   Learning Preferences Written Material      Education Topics: General Nutrition Guidelines/Fats and Fiber: -Group instruction provided by verbal, written material, models and posters to present the general guidelines for heart healthy nutrition. Gives an explanation and review of dietary fats and fiber.          Cardiac Rehab from 01/04/2015 in [facility] Cardiac Rehab   Date  01/04/15   Educator  PI   Instruction Review Code  2-  meets goals/outcomes      Controlling Sodium/Reading Food Labels: -Group verbal and written material supporting the discussion of sodium use in heart healthy nutrition. Review and explanation with models, verbal and written materials for utilization of the food label.   Exercise Physiology & Risk Factors: - Group verbal and written instruction with models to review the exercise physiology of the cardiovascular system and associated critical values. Details cardiovascular disease risk factors and the goals associated with each risk factor.   Aerobic Exercise & Resistance Training: - Gives group verbal and written discussion on the health impact of inactivity. On the components of aerobic and resistive training programs and the benefits of this training and how to safely progress through these programs.      Cardiac Rehab from 01/04/2015 in [facility] Cardiac Rehab   Date  12/07/14   Educator  SW   Instruction Review Code  2- meets goals/outcomes      Flexibility, Balance, General Exercise Guidelines: - Provides group verbal and written instruction on the benefits of flexibility and balance training programs. Provides general exercise guidelines with specific guidelines to those with heart or lung disease. Demonstration and skill practice provided.      Cardiac Rehab from 01/04/2015 in [facility] Cardiac Rehab   Date  12/14/14   Educator  SW   Instruction Review Code  2- meets goals/outcomes      Stress Management: - Provides group verbal and written instruction about the health risks of elevated stress, cause of high stress, and healthy ways to reduce stress.      Cardiac Rehab from 01/04/2015 in [place]   Date  12/16/14   Educator  Luanna Cole   Instruction Review Code  2- meets goals/outcomes      Depression: - Provides group verbal and written instruction on the correlation between heart/lung disease and depressed mood, treatment options, and the stigmas associated with  seeking treatment.   Anatomy & Physiology of the Heart: - Group verbal and written instruction and models provide basic cardiac anatomy and physiology, with the coronary electrical and arterial systems. Review of: AMI, Angina, Valve disease, Heart Failure, Cardiac Arrhythmia, Pacemakers, and the ICD.      Cardiac Rehab from 01/04/2015 in [facility] Cardiac Rehab   Date  12/21/14   Educator  CE   Instruction Review Code  2- meets goals/outcomes      Cardiac Procedures: - Group verbal and written instruction and models to describe the testing methods done to diagnose heart disease. Reviews the outcomes of the test  results. Describes the treatment choices: Medical Management, Angioplasty, or Coronary Bypass Surgery.   Cardiac Medications: - Group verbal and written instruction to review commonly prescribed medications for heart disease. Reviews the medication, class of the drug, and side effects. Includes the steps to properly store meds and maintain the prescription regimen.   Go Sex-Intimacy & Heart Disease, Get SMART - Goal Setting: - Group verbal and written instruction through game format to discuss heart disease and the return to sexual intimacy. Provides group verbal and written material to discuss and apply goal setting through the application of the S.M.A.R.T. Method.   Other Matters of the Heart: - Provides group verbal, written materials and models to describe Heart Failure, Angina, Valve Disease, and Diabetes in the realm of heart disease. Includes description of the disease process and treatment options available to the cardiac patient.      Cardiac Rehab from 01/04/2015 in [facility] Cardiac Rehab   Date  12/30/14   Educator  DW   Instruction Review Code  2- meets goals/outcomes      Exercise & Equipment Safety: - Individual verbal instruction and demonstration of equipment use and safety with use of the equipment.      Cardiac Rehab from 01/04/2015 in [facility] Cardiac Rehab   Date   12/01/14   Educator  SB   Instruction Review Code  2- meets goals/outcomes      Infection Prevention: - Provides verbal and written material to individual with discussion of infection control including proper hand washing and proper equipment cleaning during exercise session.      Cardiac Rehab from 01/04/2015 in [facility] Cardiac Rehab   Date  12/01/14   Educator  SB   Instruction Review Code  2- meets goals/outcomes      Falls Prevention: - Provides verbal and written material to individual with discussion of falls prevention and safety.      Cardiac Rehab from 01/04/2015 in [facility] Cardiac Rehab   Date  12/01/14   Educator  SB   Instruction Review Code  2- meets goals/outcomes      Diabetes: - Individual verbal and written instruction to review signs/symptoms of diabetes, desired ranges of glucose level fasting, after meals and with exercise. Advice that pre and post exercise glucose checks will be done for 3 sessions at entry of program.    Knowledge Questionnaire Score:     Knowledge Questionnaire Score - 12/01/14 1218    Knowledge Questionnaire Score   Pre Score 23/28      Personal Goals and Risk Factors at Admission:     Personal Goals and Risk Factors at Admission - 12/01/14 0842    Personal Goals and Risk Factors on Admission   Increase Aerobic Exercise and Physical Activity Yes;Sedentary   Intervention While in program, learn and follow the exercise prescription taught. Start at a low level workload and increase workload after able to maintain previous level for 30 minutes. Increase time before increasing intensity.   Intervention Provide exercise education and an individualized exercise prescription that will provide continued progressive overload as per policy without signs/symptoms of physical distress.   Take Less Medication Yes   Diabetes No   Hypertension Yes   Goal Participant will see blood pressure controlled within the values of 140/70m/Hg or within value  directed by their physician.   Intervention Provide nutrition & aerobic exercise along with prescribed medications to achieve BP 140/90 or less.   Lipids Yes   Goal Cholesterol controlled with medications as prescribed, with  individualized exercise RX and with personalized nutrition plan. Value goals: LDL < 49m, HDL > 441m Participant states understanding of desired cholesterol values and following prescriptions.   Intervention Provide nutrition & aerobic exercise along with prescribed medications to achieve LDL <7014mHDL >89m90m Stress Yes   Goal To meet with psychosocial counselor for stress and relaxation information and guidance. To state understanding of performing relaxation techniques and or identifying personal stressors.   Intervention Provide education on types of stress, identifiying stressors, and ways to cope with stress. Provide demonstration and active practice of relaxation techniques.      Personal Goals and Risk Factors Review:      Goals and Risk Factor Review      12/21/14 1800 12/24/14 1723 01/06/15 1822       Increase Aerobic Exercise and Physical Activity   Goals Progress/Improvement seen  Yes Yes Yes     Comments Is able to go 2.8mph68m Treadmill but started having PVC's again. EP MD appt not till Nov so faxed to Dr. [facility] to fax info to) his PVC and fyi that potassium, calcium etc blood work has not been drawn since he was in the hospital. SpencMichaeljamesgiven info about our independent gym and the [employer] PVC on 2% incline today on the Treadmill. [place]     Personal Goals Discharge (Final Personal Goals and Risk Factors Review):      Goals and Risk Factor Review - 01/06/15 1822    Increase Aerobic Exercise and Physical Activity   Goals Progress/Improvement seen  Yes   Comments Rare PVC on 2% incline today on the Treadmill. [place]o from [place]    Comments:

## 2015-01-06 NOTE — Addendum Note (Signed)
Addended by: Gerlene Burdock on: 01/06/2015 06:24 PM   Modules accepted: Orders

## 2015-01-06 NOTE — Progress Notes (Signed)
Daily Session Note  Patient Details  Name: Kevin Moore MRN: [id number] Date of Birth: 12/14/1950 Referring Provider:  [name], MD  Encounter Date: 01/06/2015  Check In:     Session Check In - 01/06/15 1820    Check-In   Staff Present Lestine Box BS, ACSM EP-C, Exercise Physiologist;Carroll Enterkin RN, BSN;Renee Dillard Essex MS, ACSM CEP Exercise Physiologist   ER physicians immediately available to respond to emergencies See telemetry face sheet for immediately available ER MD   Medication changes reported     No   Fall or balance concerns reported    No   Warm-up and Cool-down Performed on first and last piece of equipment   VAD Patient? No   Pain Assessment   Currently in Pain? No/denies         Goals Met:  Proper associated with RPD/PD & O2 Sat Exercise tolerated well No report of cardiac concerns or symptoms Strength training completed today  Goals Unmet:  Not Applicable  Goals Comments:    Dr. Emily Filbert is Medical Director for [place] and [facility].

## 2015-01-07 DIAGNOSIS — I213 ST elevation (STEMI) myocardial infarction of unspecified site: Secondary | ICD-10-CM | POA: Diagnosis not present

## 2015-01-07 NOTE — Progress Notes (Signed)
Daily Session Note  Patient Details  Name: MARKIS LANGLAND MRN: [id number] Date of Birth: 1950/11/28 Referring Provider:  [name], MD  Encounter Date: 01/07/2015  Check In:     Session Check In - 01/07/15 1744    Check-In   Staff Present Gerlene Burdock RN, BSN;Tylin Stradley BS, ACSM EP-C, Exercise Physiologist;Diane Mariana Arn, BSN   ER physicians immediately available to respond to emergencies See telemetry face sheet for immediately available ER MD   Medication changes reported     No   Fall or balance concerns reported    No   Warm-up and Cool-down Performed on first and last piece of equipment   VAD Patient? No   Pain Assessment   Currently in Pain? No/denies         Goals Met:  Proper associated with RPD/PD & O2 Sat Exercise tolerated well No report of cardiac concerns or symptoms Strength training completed today  Goals Unmet:  Not Applicable  Goals Comments:    Dr. Emily Filbert is Medical Director for [place] and [facility].

## 2015-01-11 DIAGNOSIS — I213 ST elevation (STEMI) myocardial infarction of unspecified site: Secondary | ICD-10-CM

## 2015-01-11 NOTE — Progress Notes (Signed)
Daily Session Note  Patient Details  Name: OMAURI BOEVE MRN: [id number] Date of Birth: 11-25-1950 Referring Provider:  [name], MD  Encounter Date: 01/11/2015  Check In:     Session Check In - 01/11/15 1606    Check-In   Staff Present Heath Lark RN, BSN, CCRP;Aaric Dolph BS, ACSM EP-C, Exercise Physiologist;Carroll Radio producer, BSN   ER physicians immediately available to respond to emergencies See telemetry face sheet for immediately available ER MD   Medication changes reported     No   Fall or balance concerns reported    No   Warm-up and Cool-down Performed on first and last piece of equipment   VAD Patient? No   Pain Assessment   Currently in Pain? No/denies         Goals Met:  Proper associated with RPD/PD & O2 Sat Exercise tolerated well No report of cardiac concerns or symptoms Strength training completed today  Goals Unmet:  Not Applicable  Goals Comments:    Dr. Emily Filbert is Medical Director for [place] and [facility].

## 2015-01-13 ENCOUNTER — Encounter: Payer: BLUE CROSS/BLUE SHIELD | Admitting: *Deleted

## 2015-01-13 DIAGNOSIS — I213 ST elevation (STEMI) myocardial infarction of unspecified site: Secondary | ICD-10-CM

## 2015-01-13 DIAGNOSIS — Z9861 Coronary angioplasty status: Secondary | ICD-10-CM

## 2015-01-13 NOTE — Progress Notes (Signed)
Daily Session Note  Patient Details  Name: Kevin Moore MRN: [id number] Date of Birth: [birth year] Referring Provider:  [name], MD  Encounter Date: 01/13/2015  Check In:     Session Check In - 01/13/15 1737    Check-In   Staff Present Diane Joya Gaskins RN, BSN;Bascom Biel Dillard Essex MS, ACSM CEP Exercise Physiologist;Carroll Enterkin RN, BSN   ER physicians immediately available to respond to emergencies See telemetry face sheet for immediately available ER MD   Medication changes reported     No   Fall or balance concerns reported    No   Warm-up and Cool-down Performed on first and last piece of equipment   VAD Patient? No   Pain Assessment   Currently in Pain? No/denies   Multiple Pain Sites No         Goals Met:  Independence with exercise equipment Exercise tolerated well No report of cardiac concerns or symptoms Strength training completed today  Goals Unmet:  Not Applicable  Goals Comments: Patient completed exercise prescription and all exercise goals during rehab session. The exercise was tolerated well and the patient is progressing in the program.    Dr. Emily Filbert is Medical Director for [place] and [facility].

## 2015-01-14 DIAGNOSIS — I213 ST elevation (STEMI) myocardial infarction of unspecified site: Secondary | ICD-10-CM

## 2015-01-14 NOTE — Progress Notes (Signed)
Daily Session Note  Patient Details  Name: JAHMAD PETRICH MRN: [id number] Date of Birth: 11/19/1950 Referring Provider:  [name], MD  Encounter Date: 01/14/2015  Check In:     Session Check In - 01/14/15 1643    Check-In   Staff Present Lestine Box BS, ACSM EP-C, Exercise Physiologist;Carroll Enterkin RN, BSN;Mary Kellie Shropshire RN   ER physicians immediately available to respond to emergencies See telemetry face sheet for immediately available ER MD   Medication changes reported     No   Fall or balance concerns reported    No   Warm-up and Cool-down Performed on first and last piece of equipment   VAD Patient? No   Pain Assessment   Currently in Pain? No/denies         Goals Met:  Proper associated with RPD/PD & O2 Sat Exercise tolerated well No report of cardiac concerns or symptoms Strength training completed today  Goals Unmet:  Not Applicable  Goals Comments:    Dr. Emily Filbert is Medical Director for [place] [facility].

## 2015-01-18 DIAGNOSIS — I213 ST elevation (STEMI) myocardial infarction of unspecified site: Secondary | ICD-10-CM

## 2015-01-18 NOTE — Progress Notes (Signed)
Daily Session Note  Patient Details  Name: Kevin Moore MRN: [id number] Date of Birth: 1950-08-07 Referring Provider:  Jerrol Banana.,*  Encounter Date: 01/18/2015  Check In:     Session Check In - 01/18/15 1638    Check-In   Staff Present Heath Lark RN, BSN, CCRP;Steven Way BS, ACSM EP-C, Exercise Physiologist;Carroll Radio producer, BSN   ER physicians immediately available to respond to emergencies See telemetry face sheet for immediately available ER MD   Medication changes reported     No   Fall or balance concerns reported    No   Warm-up and Cool-down Performed on first and last piece of equipment   VAD Patient? No   Pain Assessment   Currently in Pain? No/denies         Goals Met:  Independence with exercise equipment Exercise tolerated well Personal goals reviewed No report of cardiac concerns or symptoms  Goals Unmet:  Not Applicable  Goals Comments: [name] is doing well, remains concerned about his exercise and Heartrate during exercise.  Has appointment next week with three doctors and will ask questions to help understand his limits if any.   Dr. Emily Filbert is Medical Director for [place] [facility].

## 2015-01-18 NOTE — Progress Notes (Signed)
Daily Session Note  Patient Details  Name: ACIE CUSTIS MRN: [id number] Date of Birth: 09-Oct-1950 Referring Provider:  Jerrol Banana.,*  Encounter Date: 01/18/2015  Check In:     Session Check In - 01/18/15 1638    Check-In   Staff Present Heath Lark RN, BSN, CCRP;Destynee Stringfellow BS, ACSM EP-C, Exercise Physiologist;Carroll Radio producer, BSN   ER physicians immediately available to respond to emergencies See telemetry face sheet for immediately available ER MD   Medication changes reported     No   Fall or balance concerns reported    No   Warm-up and Cool-down Performed on first and last piece of equipment   VAD Patient? No   Pain Assessment   Currently in Pain? No/denies         Goals Met:  Proper associated with RPD/PD & O2 Sat Exercise tolerated well No report of cardiac concerns or symptoms Strength training completed today  Goals Unmet:  Not Applicable  Goals Comments:    Dr. Emily Filbert is Medical Director for [place] [facility].

## 2015-01-20 ENCOUNTER — Encounter: Payer: BLUE CROSS/BLUE SHIELD | Admitting: *Deleted

## 2015-01-20 DIAGNOSIS — I213 ST elevation (STEMI) myocardial infarction of unspecified site: Secondary | ICD-10-CM | POA: Diagnosis not present

## 2015-01-20 DIAGNOSIS — Z9861 Coronary angioplasty status: Secondary | ICD-10-CM

## 2015-01-20 NOTE — Progress Notes (Signed)
Daily Session Note  Patient Details  Name: Kevin Moore MRN: [id number] Date of Birth: [birth year] Referring Provider:  [name], MD  Encounter Date: 01/20/2015  Check In:     Session Check In - 01/20/15 1738    Check-In   Staff Present Gerlene Burdock RN, BSN;Diane Joya Gaskins RN, BSN;Renee Dillard Essex MS, ACSM CEP Exercise Physiologist   ER physicians immediately available to respond to emergencies See telemetry face sheet for immediately available ER MD   Medication changes reported     No   Fall or balance concerns reported    No   Warm-up and Cool-down Performed on first and last piece of equipment   VAD Patient? No   Pain Assessment   Currently in Pain? No/denies         Goals Met:  Independence with exercise equipment Exercise tolerated well No report of cardiac concerns or symptoms Strength training completed today  Goals Unmet:  Not Applicable  Goals Comments: Patient completed exercise prescription and all exercise goals during rehab session.  The exercise was tolerated well and the patient is progressing in the program.     Dr. Emily Filbert is Medical Director for [place] and [facility].

## 2015-01-21 DIAGNOSIS — I213 ST elevation (STEMI) myocardial infarction of unspecified site: Secondary | ICD-10-CM

## 2015-01-21 NOTE — Progress Notes (Signed)
Cardiac Individual Treatment Plan  Patient Details  Name: BARNABY RIPPEON MRN: [id number] Date of Birth: 1950-12-03 Referring Provider:  [name], MD  Initial Encounter Date:  12/01/2014  Visit Diagnosis: ST elevation myocardial infarction (STEMI), unspecified artery ([place])  S/P PTCA (percutaneous transluminal coronary angioplasty)  Patient's Home Medications on Admission:  Current outpatient prescriptions:  .  aspirin 81 MG tablet, Take by mouth., Disp: , Rfl:  .  atorvastatin (LIPITOR) 80 MG tablet, Take 80 mg by mouth., Disp: , Rfl:  .  losartan (COZAAR) 100 MG tablet, Take 1 tablet (100 mg total) by mouth daily., Disp: 90 tablet, Rfl: 3 .  omeprazole (PRILOSEC) 20 MG capsule, Take by mouth., Disp: , Rfl:  .  ticagrelor (BRILINTA) 90 MG TABS tablet, Take 90 mg by mouth., Disp: , Rfl:   Past Medical History: No past medical history on file.  Tobacco Use: History  Smoking status  . Never Smoker   Smokeless tobacco  . Not on file    Labs: Recent Review Flowsheet Data    Labs for ITP Cardiac and Pulmonary Rehab Latest Ref Rng 06/11/2014   Cholestrol 0 - 200 mg/dL 232(A)   LDLCALC - 167   HDL 35 - 70 mg/dL 46   Trlycerides 40 - 160 mg/dL 94       Exercise Target Goals:    Exercise Program Goal: Individual exercise prescription set with THRR, safety & activity barriers. Participant demonstrates ability to understand and report RPE using BORG scale, to self-measure pulse accurately, and to acknowledge the importance of the exercise prescription.  Exercise Prescription Goal: Starting with aerobic activity 30 plus minutes a day, 3 days per week for initial exercise prescription. Provide home exercise prescription and guidelines that participant acknowledges understanding prior to discharge.  Activity Barriers & Risk Stratification:     Activity Barriers & Risk Stratification - 12/01/14 0830    Activity Barriers & Risk Stratification   Activity Barriers Joint  Problems;Deconditioning   Risk Stratification Moderate      6 Minute Walk:     6 Minute Walk      12/01/14 1020       6 Minute Walk   Phase Initial     Distance 1675 feet     Walk Time 6 minutes     Resting HR 48 bpm     Resting BP 182/90 mmHg     Max Ex. HR 84 bpm     Max Ex. BP 188/90 mmHg     RPE 11     Symptoms No        Initial Exercise Prescription:     Initial Exercise Prescription - 12/01/14 1000    Date of Initial Exercise Prescription   Date 12/01/14   Treadmill   MPH 2.8   Grade 0   Minutes 15   Bike   Level 0.4   Minutes 10   Recumbant Bike   Level 3   RPM 45   Watts 25   Minutes 15   NuStep   Level 3   Watts 40   Minutes 15   Arm Ergometer   Level 1   Watts 8   Minutes 10   Arm/Foot Ergometer   Level 4   Watts 15   Minutes 10   Cybex   Level 1   RPM 60   Minutes 10   Recumbant Elliptical   Level 1   RPM 40   Watts 10   Minutes 10  Elliptical   Level 1   Speed 3   Minutes 1   REL-XR   Level 2   Watts 35   Minutes 15   Prescription Details   Frequency (times per week) 3   Duration Progress to 30 minutes of continuous aerobic without signs/symptoms of physical distress   Intensity   THRR REST +  30   Ratings of Perceived Exertion 11-15   Progression Continue progressive overload as per policy without signs/symptoms or physical distress.   Resistance Training   Training Prescription Yes   Weight 2   Reps 10-15      Exercise Prescription Changes:     Exercise Prescription Changes      12/07/14 0600 12/07/14 0640 12/23/14 1700 12/24/14 1700 12/29/14 0800   Exercise Review   Progression No  Only one visit recorded No  Only one visit recorded Yes  Yes   Response to Exercise   Blood Pressure (Admit)  146/92 mmHg   146/82 mmHg   Blood Pressure (Exercise)  182/90 mmHg   134/78 mmHg   Blood Pressure (Exit)  172/80 mmHg   108/62 mmHg   Heart Rate (Admit)  70 bpm   61 bpm   Heart Rate (Exercise)  119 bpm   72 bpm    Heart Rate (Exit)  60 bpm   59 bpm   Rating of Perceived Exertion (Exercise)  11   12   Symptoms  Yes None  None   Comments  PVCs and [place]. Stopped on treadmill due to rhythm and finished on recumbent equipment Reviewed individualized exercise prescription and made increases per departmental policy. Exercise increases were discussed with the patient and they were able to perform the new work loads without issue (no signs or symptoms). Hunner was very compliant with the interval training and did very well with the first program. An interval program card was put in his chart and he will use this in every class. Given info about the Independent gym and also Victor given info about the [employer] program for when he finishes Cardiac rehab.  Discussed adding one day a week of exercise at home in addition to the three days a week at [facility]. Explained that 150 minutes a week of exercise is the goal for combating and managing chronic health conditions. This volume of exercise is also proven to give other health benefits.    Frequency     Add 2 additional days to program exercise sessions.   Duration  Progress to 30 minutes of continuous aerobic without signs/symptoms of physical distress Progress to 30 minutes of continuous aerobic without signs/symptoms of physical distress  Progress to 30 minutes of continuous aerobic without signs/symptoms of physical distress   Intensity  Rest + 30 Rest + 30  Rest + 30   Progression  Continue progressive overload as per policy without signs/symptoms or physical distress. Continue progressive overload as per policy without signs/symptoms or physical distress.  Continue progressive overload as per policy without signs/symptoms or physical distress.   Resistance Training   Training Prescription  Yes Yes  Yes   Weight  _0 Reps  10-15 10-15  10-15   Interval Training   Interval Training  No Yes  Yes   Equipment   REL-XR  REL-XR   Comments   L7/85 watts for  3 min; Level 10-11/120 watts for 1 min; repeat  L7/85 watts for 3 min; Level 10-11/120 watts for 1 min; repeat  Treadmill   MPH  2.5 2.5  3   Grade  0 0  2   Minutes  _0 NuStep   Level  _1 Watts  50 50  50   Minutes  _2 Elliptical   Level     2   Speed     3   Minutes     10   REL-XR   Level  _3 Watts  55 55  55   Minutes  _4 12/30/14 1800 01/04/15 1600         Exercise Review   Progression Yes Yes      Response to Exercise   Rating of Perceived Exertion (Exercise) 12 12      Symptoms None None      Comments A new target heart rate range for exercise was calculated based on the patient's ability to exercise with increased intensity. The range is 40-85% of HRR and encompasses moderate-vigorous exercise. The target heart rate range is equivalent to an RPE of 12-17. Quamaine is exercising at home 2 d/wk in addition to HT. He is exericising on his bike or walking in his neighborhood for 30 minutes per session.  A new target heart rate range for exercise was calculated based on the patient's ability to exercise with increased intensity. The range is 40-85% of HRR and encompasses moderate-vigorous exercise. The target heart rate range is equivalent to an RPE of 12-17. Rumaldo is exercising at home 2 d/wk in addition to HT. He is exericising on his bike or walking in his neighborhood for 30 minutes per session.       Duration Progress to 30 minutes of continuous aerobic without signs/symptoms of physical distress Progress to 30 minutes of continuous aerobic without signs/symptoms of physical distress      Intensity Other (comment)  40-80% of HRR 99-149 Other (comment)  40-80% of HRR 99-149      Progression Continue progressive overload as per policy without signs/symptoms or physical distress. Continue progressive overload as per policy without signs/symptoms or physical distress.      Resistance Training   Training Prescription Yes Yes      Weight  5 5      Reps 10-15 10-15      Interval Training   Interval Training Yes Yes      Equipment REL-XR REL-XR      Comments L7/85 watts for 3 min; Level 10-11/120 watts for 1 min; repeat L8/80 watts for 3 min; Level 10-12/120 watts for 1 min; repeat      Treadmill   MPH 3 3      Grade 2 2      Minutes 20 20      NuStep   Level 3 3      Watts 50 50      Minutes 15 15      Elliptical   Level 2 2      Speed 3 3      Minutes 10 10      REL-XR   Level 2 2      Watts 55 55      Minutes 15 15         Discharge Exercise Prescription (Final Exercise Prescription Changes):     Exercise Prescription Changes - 01/04/15 1600    Exercise Review   Progression  Yes   Response to Exercise   Rating of Perceived Exertion (Exercise) 12   Symptoms None   Comments A new target heart rate range for exercise was calculated based on the patient's ability to exercise with increased intensity. The range is 40-85% of HRR and encompasses moderate-vigorous exercise. The target heart rate range is equivalent to an RPE of 12-17. Kamran is exercising at home 2 d/wk in addition to HT. He is exericising on his bike or walking in his neighborhood for 30 minutes per session.    Duration Progress to 30 minutes of continuous aerobic without signs/symptoms of physical distress   Intensity Other (comment)  40-80% of HRR 99-149   Progression Continue progressive overload as per policy without signs/symptoms or physical distress.   Resistance Training   Training Prescription Yes   Weight 5   Reps 10-15   Interval Training   Interval Training Yes   Equipment REL-XR   Comments L8/80 watts for 3 min; Level 10-12/120 watts for 1 min; repeat   Treadmill   MPH 3   Grade 2   Minutes 20   NuStep   Level 3   Watts 50   Minutes 15   Elliptical   Level 2   Speed 3   Minutes 10   REL-XR   Level 2   Watts 55   Minutes 15      Nutrition:  Target Goals: Understanding of nutrition guidelines, daily intake of  sodium <1579m, cholesterol <2023m calories 30% from fat and 7% or less from saturated fats, daily to have 5 or more servings of fruits and vegetables.  Biometrics:     Pre Biometrics - 12/01/14 1041    Pre Biometrics   Height 6' 1.88" (1.877 m)   Weight 213 lb 3.2 oz (96.707 kg)   BMI (Calculated) 27.5       Nutrition Therapy Plan and Nutrition Goals:     Nutrition Therapy & Goals - 12/11/14 0901    Nutrition Therapy   Diet DASH   Drug/Food Interactions Statins/Certain Fruits   Fiber 30 grams   Whole Grain Foods 3 servings   Protein 8 ounces/day   Saturated Fats 14 max. grams   Fruits and Vegetables 8 servings/day   Personal Nutrition Goals   Personal Goal #1 Continue with heart healthy eating pattern.       Nutrition Discharge: Rate Your Plate Scores:     Rate Your Plate - 0999/37/1699678  Rate Your Plate Scores   Pre Score 70   Pre Score % 78 %      Nutrition Goals Re-Evaluation:     Nutrition Goals Re-Evaluation      12/21/14 1759 12/21/14 1802 01/18/15 1759       Personal Goal #1 Re-Evaluation   Personal Goal #1 Has met with dietician and cutting back on Fats. Is glad to know he can eat more than grilled chicken like healthy fast food choices even.   Continues to watch his sodium intake as well as saturated fats.  "If I don't fix it ,I don't eat it" when reviewing sodium restrictions.     Comments  I discussed caffeine intake with SpFrederico Hammano try to prevent PVC but he said MD said to drink some caffeine to help with shortness of breath that he feels with [facility]ili[facility] medicine.       Personal Goal #2 Re-Evaluation   Personal Goal #2 SpDeadricktated he has eaten healthy in his later life but his  early life eating habits are catching up with him.           Psychosocial: Target Goals: Acknowledge presence or absence of depression, maximize coping skills, provide positive support system. Participant is able to verbalize types and ability to use techniques and  skills needed for reducing stress and depression.  Initial Review & Psychosocial Screening:     Initial Psych Review & Screening - 12/01/14 [place]? Yes   Barriers   Psychosocial barriers to participate in program There are no identifiable barriers or psychosocial needs.;The patient should benefit from training in stress management and relaxation.   Screening Interventions   Interventions Encouraged to exercise;Program counselor consult      Quality of Life Scores:     Quality of Life - 12/01/14 1500    Quality of Life Scores   Health/Function Pre 29.2 %   Socioeconomic Pre 28.32 %   Psych/Spiritual Pre 28.93 %   Family Pre 30 %   GLOBAL Pre 29.06 %      PHQ-9:     Recent Review Flowsheet Data    Depression screen [place] 2/9 12/01/2014   Decreased Interest 0   Down, Depressed, Hopeless 0   PHQ - 2 Score 0   Altered sleeping 0   Tired, decreased energy 0   Change in appetite 0   Feeling bad or failure about yourself  0   Trouble concentrating 0   Moving slowly or fidgety/restless 0   Suicidal thoughts 0   PHQ-9 Score 0      Psychosocial Evaluation and Intervention:     Psychosocial Evaluation - 12/07/14 1657    Psychosocial Evaluation & Interventions   Interventions Stress management education;Relaxation education;Encouraged to exercise with the program and follow exercise prescription   Comments Counselor met with Mr. Muhammed today for initial psychosocial evaluation.  He is a 64 year old who had a heart attack seven weeks ago with (3) stents inserted.  He has a strong support system with a spouse of 63 years and adult children who live close by.  He is also actively involved in is faith community.  Mr. Lipsky reports he is typically in good health and sleeps and eats well.  He denies a history of depression or current symptoms and admits to a history of "situational" anxiety which was prescribed medication for awhile back,  but reports it wasn't effective and doesn't take anymore.  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 Counselor educated him on some stress management and encouraged to attend the educational class on this in this program. He has goals for increasing his strength and stamina and improving his diet and nutritional intake.  He admits to never having done aerobic exercise.  He is encouraged to consistently begin doing so and to meet with the dietician to accomplish his nutritional goals.   Continued Psychosocial Services Needed Yes  Mr. Que will benefit from the stress management psychoeducational component of this program as well as engage in consistent exercise.       Psychosocial Re-Evaluation:     Psychosocial Re-Evaluation      12/21/14 1802           Psychosocial Re-Evaluation   Interventions Encouraged to attend Cardiac Rehabilitation for the exercise       Comments Akbar said it is stressful to feel the PVC's he does. He said he walks fast at  work and doesn't pay any mind to them.           Vocational Rehabilitation: Provide vocational rehab assistance to qualifying candidates.   Vocational Rehab Evaluation & Intervention:     Vocational Rehab - 12/01/14 [phone]    Initial Vocational Rehab Evaluation & Intervention   Assessment shows need for Vocational Rehabilitation No      Education: Education Goals: Education classes will be provided on a weekly basis, covering required topics. Participant will state understanding/return demonstration of topics presented.  Learning Barriers/Preferences:     Learning Barriers/Preferences - 12/01/14 1660    Learning Barriers/Preferences   Learning Barriers None   Learning Preferences Written Material      Education Topics: General Nutrition Guidelines/Fats and Fiber: -Group instruction provided by verbal, written material, models and posters to present the general guidelines for  heart healthy nutrition. Gives an explanation and review of dietary fats and fiber.          Cardiac Rehab from 01/20/2015 in [facility] Cardiac Rehab   Date  01/04/15   Educator  PI   Instruction Review Code  2- meets goals/outcomes      Controlling Sodium/Reading Food Labels: -Group verbal and written material supporting the discussion of sodium use in heart healthy nutrition. Review and explanation with models, verbal and written materials for utilization of the food label.      Cardiac Rehab from 01/20/2015 in [facility] Cardiac Rehab   Date  01/11/15   Educator  PI   Instruction Review Code  2- meets goals/outcomes      Exercise Physiology & Risk Factors: - Group verbal and written instruction with models to review the exercise physiology of the cardiovascular system and associated critical values. Details cardiovascular disease risk factors and the goals associated with each risk factor.   Aerobic Exercise & Resistance Training: - Gives group verbal and written discussion on the health impact of inactivity. On the components of aerobic and resistive training programs and the benefits of this training and how to safely progress through these programs.      Cardiac Rehab from 01/20/2015 in [facility] Cardiac Rehab   Date  12/07/14   Educator  SW   Instruction Review Code  2- meets goals/outcomes      Flexibility, Balance, General Exercise Guidelines: - Provides group verbal and written instruction on the benefits of flexibility and balance training programs. Provides general exercise guidelines with specific guidelines to those with heart or lung disease. Demonstration and skill practice provided.      Cardiac Rehab from 01/20/2015 in [facility] Cardiac Rehab   Date  12/14/14   Educator  SW   Instruction Review Code  2- meets goals/outcomes      Stress Management: - Provides group verbal and written instruction about the health risks of elevated stress, cause of high stress, and healthy ways to  reduce stress.      Cardiac Rehab from 01/20/2015 in [facility] Cardiac Rehab   Date  12/16/14   Educator  Luanna Cole   Instruction Review Code  2- meets goals/outcomes      Depression: - Provides group verbal and written instruction on the correlation between heart/lung disease and depressed mood, treatment options, and the stigmas associated with seeking treatment.      Cardiac Rehab from 01/20/2015 in [facility] Cardiac Rehab   Date  01/20/15   Educator  McCormick   Instruction Review Code  2- meets goals/outcomes      Anatomy & Physiology of the  Heart: - Group verbal and written instruction and models provide basic cardiac anatomy and physiology, with the coronary electrical and arterial systems. Review of: AMI, Angina, Valve disease, Heart Failure, Cardiac Arrhythmia, Pacemakers, and the ICD.      Cardiac Rehab from 01/20/2015 in [facility] Cardiac Rehab   Date  12/21/14   Educator  CE   Instruction Review Code  2- meets goals/outcomes      Cardiac Procedures: - Group verbal and written instruction and models to describe the testing methods done to diagnose heart disease. Reviews the outcomes of the test results. Describes the treatment choices: Medical Management, Angioplasty, or Coronary Bypass Surgery.      Cardiac Rehab from 01/20/2015 in [facility] Cardiac Rehab   Date  01/18/15   Educator  SB   Instruction Review Code  2- meets goals/outcomes      Cardiac Medications: - Group verbal and written instruction to review commonly prescribed medications for heart disease. Reviews the medication, class of the drug, and side effects. Includes the steps to properly store meds and maintain the prescription regimen.   Go Sex-Intimacy & Heart Disease, Get SMART - Goal Setting: - Group verbal and written instruction through game format to discuss heart disease and the return to sexual intimacy. Provides group verbal and written material to discuss and apply goal setting through the application of the  S.M.A.R.T. Method.      Cardiac Rehab from 01/20/2015 in [facility] Cardiac Rehab   Date  01/18/15   Educator  SB   Instruction Review Code  2- meets goals/outcomes      Other Matters of the Heart: - Provides group verbal, written materials and models to describe Heart Failure, Angina, Valve Disease, and Diabetes in the realm of heart disease. Includes description of the disease process and treatment options available to the cardiac patient.      Cardiac Rehab from 01/20/2015 in [facility] Cardiac Rehab   Date  12/30/14   Educator  DW   Instruction Review Code  2- meets goals/outcomes      Exercise & Equipment Safety: - Individual verbal instruction and demonstration of equipment use and safety with use of the equipment.      Cardiac Rehab from 01/20/2015 in [facility] Cardiac Rehab   Date  12/01/14   Educator  SB   Instruction Review Code  2- meets goals/outcomes      Infection Prevention: - Provides verbal and written material to individual with discussion of infection control including proper hand washing and proper equipment cleaning during exercise session.      Cardiac Rehab from 01/20/2015 in [facility] Cardiac Rehab   Date  12/01/14   Educator  SB   Instruction Review Code  2- meets goals/outcomes      Falls Prevention: - Provides verbal and written material to individual with discussion of falls prevention and safety.      Cardiac Rehab from 01/20/2015 in [facility] Cardiac Rehab   Date  12/01/14   Educator  SB   Instruction Review Code  2- meets goals/outcomes      Diabetes: - Individual verbal and written instruction to review signs/symptoms of diabetes, desired ranges of glucose level fasting, after meals and with exercise. Advice that pre and post exercise glucose checks will be done for 3 sessions at entry of program.    Knowledge Questionnaire Score:     Knowledge Questionnaire Score - 12/01/14 1218    Knowledge Questionnaire Score   Pre Score 23/28  Personal Goals and  Risk Factors at Admission:     Personal Goals and Risk Factors at Admission - 12/01/14 0842    Personal Goals and Risk Factors on Admission   Increase Aerobic Exercise and Physical Activity Yes;Sedentary   Intervention While in program, learn and follow the exercise prescription taught. Start at a low level workload and increase workload after able to maintain previous level for 30 minutes. Increase time before increasing intensity.   Intervention Provide exercise education and an individualized exercise prescription that will provide continued progressive overload as per policy without signs/symptoms of physical distress.   Take Less Medication Yes   Diabetes No   Hypertension Yes   Goal Participant will see blood pressure controlled within the values of 140/21m/Hg or within value directed by their physician.   Intervention Provide nutrition & aerobic exercise along with prescribed medications to achieve BP 140/90 or less.   Lipids Yes   Goal Cholesterol controlled with medications as prescribed, with individualized exercise RX and with personalized nutrition plan. Value goals: LDL < 746m HDL > 4079mParticipant states understanding of desired cholesterol values and following prescriptions.   Intervention Provide nutrition & aerobic exercise along with prescribed medications to achieve LDL <66m49mDL >40mg76mStress Yes   Goal To meet with psychosocial counselor for stress and relaxation information and guidance. To state understanding of performing relaxation techniques and or identifying personal stressors.   Intervention Provide education on types of stress, identifiying stressors, and ways to cope with stress. Provide demonstration and active practice of relaxation techniques.      Personal Goals and Risk Factors Review:      Goals and Risk Factor Review      12/21/14 1800 12/24/14 1723 01/06/15 1822 01/18/15 1801     Increase Aerobic Exercise and Physical Activity   Goals  Progress/Improvement seen  Yes Yes Yes Yes    Comments Is able to go 2.8mph 68mTreadmill but started having PVC's again. EP MD appt not till Nov so faxed to Dr. GilberRosanna [facility] SpenceBlessto fax info to) his PVC and fyi that potassium, calcium etc blood work has not been drawn since he was in the hospital. SpenceQuintyniven info about our independent gym and the [employer] PVC on 2% incline today on the Treadmill. No c/o from SpenceBridgeportng well, is concerned that he is not getting his heartrate up over 120.  Reviewed the fact that he is 50 plus beats over resting and that he is working an RPE between 11 and 15.  He said he will remember that as he looks at his exercise regimen. He will see all three MD's next week and will review his Heartrate, his ectopics and his medications during the visits.    Take Less Medication   Goals Progress/Improvement seen    No    Comments    No changes yet.  Does see 3 doctors next week.    Hypertension   Goal    Participant will see blood pressure controlled within the values of 140/90mm/H65m within value directed by their physician.    Progress seen toward goals    Yes    Comments    SpencerDemontrayome in occasionally with a BP diastolic above 90([id number](VUYEBwork). His BP readings at end of session are all in good range. He does see his MD next week and will review BP readings then.    Abnormal Lipids  Goal    Cholesterol controlled with medications as prescribed, with individualized exercise RX and with personalized nutrition plan. Value goals: LDL < 55m, HDL > 417m Participant states understanding of desired cholesterol values and following prescriptions.    Progress seen towards goals    Yes    Comments    SpTeodors exercising and working on his nutrition to maintain cholesterol levels.    Stress   Goal    To meet with psychosocial counselor for stress and relaxation information and guidance. To state understanding of performing  relaxation techniques and or identifying personal stressors.    Progress seen towards goals    Yes    Comments    SpMeadetates that he is doing well managing his stress. He does recognize what causes the stress and will avoid the stress inducing factors if possible.       Personal Goals Discharge (Final Personal Goals and Risk Factors Review):      Goals and Risk Factor Review - 01/18/15 1801    Increase Aerobic Exercise and Physical Activity   Goals Progress/Improvement seen  Yes   Comments Doing well, is concerned that he is not getting his heartrate up over 120.  Reviewed the fact that he is 50 plus beats over resting and that he is working an RPE between 11 and 15.  He said he will remember that as he looks at his exercise regimen. He will see all three MD's next week and will review his Heartrate, his ectopics and his medications during the visits.   Take Less Medication   Goals Progress/Improvement seen No   Comments No changes yet.  Does see 3 doctors next week.   Hypertension   Goal Participant will see blood pressure controlled within the values of 140/9024mg or within value directed by their physician.   Progress seen toward goals Yes   Comments SpeDonnaes come in occasionally with a BP diastolic above 90(06(YOKHTter work). His BP readings at end of session are all in good range. He does see his MD next week and will review BP readings then.   Abnormal Lipids   Goal Cholesterol controlled with medications as prescribed, with individualized exercise RX and with personalized nutrition plan. Value goals: LDL < 62m17mDL > 40mg4mrticipant states understanding of desired cholesterol values and following prescriptions.   Progress seen towards goals Yes   Comments SpencDelxercising and working on his nutrition to maintain cholesterol levels.   Stress   Goal To meet with psychosocial counselor for stress and relaxation information and guidance. To state understanding of  performing relaxation techniques and or identifying personal stressors.   Progress seen towards goals Yes   Comments SpencCarlaes that he is doing well managing his stress. He does recognize what causes the stress and will avoid the stress inducing factors if possible.       Comments: SpencLaytenl having PVC's with increased heart rate of 110 on treadmill and incline 5% which will stop when he decreases his incline and slow down.

## 2015-01-21 NOTE — Progress Notes (Signed)
Daily Session Note  Patient Details  Name: Kevin Moore MRN: [id number] Date of Birth: 02/27/1951 Referring Provider:  [name], MD  Encounter Date: 01/21/2015  Check In:     Session Check In - 01/21/15 1627    Check-In   Staff Present Lestine Box BS, ACSM EP-C, Exercise Physiologist;Carroll Enterkin RN, BSN;Diane Joya Gaskins RN, BSN   ER physicians immediately available to respond to emergencies See telemetry face sheet for immediately available ER MD   Medication changes reported     No   Fall or balance concerns reported    No   Warm-up and Cool-down Performed on first and last piece of equipment   VAD Patient? No   Pain Assessment   Currently in Pain? No/denies         Goals Met:  Proper associated with RPD/PD & O2 Sat Exercise tolerated well No report of cardiac concerns or symptoms Strength training completed today  Goals Unmet:  Not Applicable  Goals Comments:    Dr. Emily Filbert is Medical Director for [place] and [facility].

## 2015-01-21 NOTE — Progress Notes (Signed)
Cardiac Individual Treatment Plan  Patient Details  Name: Kevin Moore MRN: [id number] Date of Birth: December 12, 1950 Referring Provider:  [name], MD  Initial Encounter Date:    Visit Diagnosis: ST elevation myocardial infarction (STEMI), unspecified artery ([place])  Patient's Home Medications on Admission:  Current outpatient prescriptions:  .  aspirin 81 MG tablet, Take by mouth., Disp: , Rfl:  .  atorvastatin (LIPITOR) 80 MG tablet, Take 80 mg by mouth., Disp: , Rfl:  .  losartan (COZAAR) 100 MG tablet, Take 1 tablet (100 mg total) by mouth daily., Disp: 90 tablet, Rfl: 3 .  omeprazole (PRILOSEC) 20 MG capsule, Take by mouth., Disp: , Rfl:  .  ticagrelor (BRILINTA) 90 MG TABS tablet, Take 90 mg by mouth., Disp: , Rfl:   Past Medical History: No past medical history on file.  Tobacco Use: History  Smoking status  . Never Smoker   Smokeless tobacco  . Not on file    Labs: Recent Review Flowsheet Data    Labs for ITP Cardiac and Pulmonary Rehab Latest Ref Rng 06/11/2014   Cholestrol 0 - 200 mg/dL 232(A)   LDLCALC - 167   HDL 35 - 70 mg/dL 46   Trlycerides 40 - 160 mg/dL 94       Exercise Target Goals:    Exercise Program Goal: Individual exercise prescription set with THRR, safety & activity barriers. Participant demonstrates ability to understand and report RPE using BORG scale, to self-measure pulse accurately, and to acknowledge the importance of the exercise prescription.  Exercise Prescription Goal: Starting with aerobic activity 30 plus minutes a day, 3 days per week for initial exercise prescription. Provide home exercise prescription and guidelines that participant acknowledges understanding prior to discharge.  Activity Barriers & Risk Stratification:     Activity Barriers & Risk Stratification - 12/01/14 0830    Activity Barriers & Risk Stratification   Activity Barriers Joint Problems;Deconditioning   Risk Stratification Moderate      6  Minute Walk:     6 Minute Walk      12/01/14 1020 01/21/15 1647     6 Minute Walk   Phase Initial Discharge    Distance 1675 feet 1685 feet    Distance % Change  1 %    Walk Time 6 minutes 6 minutes    Resting HR 48 bpm 80 bpm    Resting BP 182/90 mmHg 122/78 mmHg    Max Ex. HR 84 bpm 112 bpm    Max Ex. BP 188/90 mmHg 142/76 mmHg    RPE 11 12    Perceived Dyspnea   0    Symptoms No No       Initial Exercise Prescription:     Initial Exercise Prescription - 12/01/14 1000    Date of Initial Exercise Prescription   Date 12/01/14   Treadmill   MPH 2.8   Grade 0   Minutes 15   Bike   Level 0.4   Minutes 10   Recumbant Bike   Level 3   RPM 45   Watts 25   Minutes 15   NuStep   Level 3   Watts 40   Minutes 15   Arm Ergometer   Level 1   Watts 8   Minutes 10   Arm/Foot Ergometer   Level 4   Watts 15   Minutes 10   Cybex   Level 1   RPM 60   Minutes 10   Recumbant Elliptical  Level 1   RPM 40   Watts 10   Minutes 10   Elliptical   Level 1   Speed 3   Minutes 1   REL-XR   Level 2   Watts 35   Minutes 15   Prescription Details   Frequency (times per week) 3   Duration Progress to 30 minutes of continuous aerobic without signs/symptoms of physical distress   Intensity   THRR REST +  30   Ratings of Perceived Exertion 11-15   Progression Continue progressive overload as per policy without signs/symptoms or physical distress.   Resistance Training   Training Prescription Yes   Weight 2   Reps 10-15      Exercise Prescription Changes:     Exercise Prescription Changes      12/07/14 0600 12/07/14 0640 12/23/14 1700 12/24/14 1700 12/29/14 0800   Exercise Review   Progression No  Only one visit recorded No  Only one visit recorded Yes  Yes   Response to Exercise   Blood Pressure (Admit)  146/92 mmHg   146/82 mmHg   Blood Pressure (Exercise)  182/90 mmHg   134/78 mmHg   Blood Pressure (Exit)  172/80 mmHg   108/62 mmHg   Heart Rate (Admit)   70 bpm   61 bpm   Heart Rate (Exercise)  119 bpm   72 bpm   Heart Rate (Exit)  60 bpm   59 bpm   Rating of Perceived Exertion (Exercise)  11   12   Symptoms  Yes None  None   Comments  PVCs and Bigem[place]. Stopped on treadmill due to rhythm and finished on recumbent equipment Reviewed individualized exercise prescription and made increases per departmental policy. Exercise increases were discussed with the patient and they were able to perform the new work loads without issue (no signs or symptoms). Kevin Moore was very compliant with the interval training and did very well with the first program. An interval program card was put in his chart and he will use this in every class. Given info about the Independent gym and also Kevin Moore given info about the [employer] program for when he finishes Cardiac rehab.  Discussed adding one day a week of exercise at home in addition to the three days a week at [facility]. Explained that 150 minutes a week of exercise is the goal for combating and managing chronic health conditions. This volume of exercise is also proven to give other health benefits.    Frequency     Add 2 additional days to program exercise sessions.   Duration  Progress to 30 minutes of continuous aerobic without signs/symptoms of physical distress Progress to 30 minutes of continuous aerobic without signs/symptoms of physical distress  Progress to 30 minutes of continuous aerobic without signs/symptoms of physical distress   Intensity  Rest + 30 Rest + 30  Rest + 30   Progression  Continue progressive overload as per policy without signs/symptoms or physical distress. Continue progressive overload as per policy without signs/symptoms or physical distress.  Continue progressive overload as per policy without signs/symptoms or physical distress.   Resistance Training   Training Prescription  Yes Yes  Yes   Weight  '2 2  5   ' Reps  10-15 10-15  10-15   Interval Training   Interval Training  No Yes   Yes   Equipment   REL-XR  REL-XR   Comments   L7/85 watts for 3 min; Level 10-11/120 watts for  1 min; repeat  L7/85 watts for 3 min; Level 10-11/120 watts for 1 min; repeat   Treadmill   MPH  2.5 2.5  3   Grade  0 0  2   Minutes  '10 10  20   ' NuStep   Level  '3 3  3   ' Watts  50 50  50   Minutes  '15 15  15   ' Elliptical   Level     2   Speed     3   Minutes     10   REL-XR   Level  '2 2  2   ' Watts  55 55  55   Minutes  '15 15  15     ' 12/30/14 1800 01/04/15 1600         Exercise Review   Progression Yes Yes      Response to Exercise   Rating of Perceived Exertion (Exercise) 12 12      Symptoms None None      Comments A new target heart rate range for exercise was calculated based on the patient's ability to exercise with increased intensity. The range is 40-85% of HRR and encompasses moderate-vigorous exercise. The target heart rate range is equivalent to an RPE of 12-17. Benn is exercising at home 2 d/wk in addition to HT. He is exericising on his bike or walking in his neighborhood for 30 minutes per session.  A new target heart rate range for exercise was calculated based on the patient's ability to exercise with increased intensity. The range is 40-85% of HRR and encompasses moderate-vigorous exercise. The target heart rate range is equivalent to an RPE of 12-17. Zuriel is exercising at home 2 d/wk in addition to HT. He is exericising on his bike or walking in his neighborhood for 30 minutes per session.       Duration Progress to 30 minutes of continuous aerobic without signs/symptoms of physical distress Progress to 30 minutes of continuous aerobic without signs/symptoms of physical distress      Intensity Other (comment)  40-80% of HRR 99-149 Other (comment)  40-80% of HRR 99-149      Progression Continue progressive overload as per policy without signs/symptoms or physical distress. Continue progressive overload as per policy without signs/symptoms or physical distress.       Resistance Training   Training Prescription Yes Yes      Weight 5 5      Reps 10-15 10-15      Interval Training   Interval Training Yes Yes      Equipment REL-XR REL-XR      Comments L7/85 watts for 3 min; Level 10-11/120 watts for 1 min; repeat L8/80 watts for 3 min; Level 10-12/120 watts for 1 min; repeat      Treadmill   MPH 3 3      Grade 2 2      Minutes 20 20      NuStep   Level 3 3      Watts 50 50      Minutes 15 15      Elliptical   Level 2 2      Speed 3 3      Minutes 10 10      REL-XR   Level 2 2      Watts 55 55      Minutes 15 15         Discharge Exercise Prescription (Final Exercise Prescription Changes):  Exercise Prescription Changes - 01/04/15 1600    Exercise Review   Progression Yes   Response to Exercise   Rating of Perceived Exertion (Exercise) 12   Symptoms None   Comments A new target heart rate range for exercise was calculated based on the patient's ability to exercise with increased intensity. The range is 40-85% of HRR and encompasses moderate-vigorous exercise. The target heart rate range is equivalent to an RPE of 12-17. Maleek is exercising at home 2 d/wk in addition to HT. He is exericising on his bike or walking in his neighborhood for 30 minutes per session.    Duration Progress to 30 minutes of continuous aerobic without signs/symptoms of physical distress   Intensity Other (comment)  40-80% of HRR 99-149   Progression Continue progressive overload as per policy without signs/symptoms or physical distress.   Resistance Training   Training Prescription Yes   Weight 5   Reps 10-15   Interval Training   Interval Training Yes   Equipment REL-XR   Comments L8/80 watts for 3 min; Level 10-12/120 watts for 1 min; repeat   Treadmill   MPH 3   Grade 2   Minutes 20   NuStep   Level 3   Watts 50   Minutes 15   Elliptical   Level 2   Speed 3   Minutes 10   REL-XR   Level 2   Watts 55   Minutes 15      Nutrition:  Target  Goals: Understanding of nutrition guidelines, daily intake of sodium <1547m, cholesterol <2067m calories 30% from fat and 7% or less from saturated fats, daily to have 5 or more servings of fruits and vegetables.  Biometrics:     Pre Biometrics - 12/01/14 1041    Pre Biometrics   Height 6' 1.88" (1.877 m)   Weight 213 lb 3.2 oz (96.707 kg)   BMI (Calculated) 27.5       Nutrition Therapy Plan and Nutrition Goals:     Nutrition Therapy & Goals - 12/11/14 0901    Nutrition Therapy   Diet DASH   Drug/Food Interactions Statins/Certain Fruits   Fiber 30 grams   Whole Grain Foods 3 servings   Protein 8 ounces/day   Saturated Fats 14 max. grams   Fruits and Vegetables 8 servings/day   Personal Nutrition Goals   Personal Goal #1 Continue with heart healthy eating pattern.       Nutrition Discharge: Rate Your Plate Scores:     Rate Your Plate - 1062/22/9779892  Rate Your Plate Scores   Post Score 76   Post Score % 84 %      Nutrition Goals Re-Evaluation:     Nutrition Goals Re-Evaluation      12/21/14 1759 12/21/14 1802 01/18/15 1759       Personal Goal #1 Re-Evaluation   Personal Goal #1 Has met with dietician and cutting back on Fats. Is glad to know he can eat more than grilled chicken like healthy fast food choices even.   Continues to watch his sodium intake as well as saturated fats.  "If I don't fix it ,I don't eat it" when reviewing sodium restrictions.     Comments  I discussed caffeine intake with SpFrederico Hammano try to prevent PVC but he said MD said to drink some caffeine to help with shortness of breath that he feels with [facility] medicine.       Personal Goal #2 Re-Evaluation   Personal  Goal #2 Kartier stated he has eaten healthy in his later life but his early life eating habits are catching up with him.           Psychosocial: Target Goals: Acknowledge presence or absence of depression, maximize coping skills, provide positive support system. Participant  is able to verbalize types and ability to use techniques and skills needed for reducing stress and depression.  Initial Review & Psychosocial Screening:     Initial Psych Review & Screening - 12/01/14 [place]? Yes   Barriers   Psychosocial barriers to participate in program There are no identifiable barriers or psychosocial needs.;The patient should benefit from training in stress management and relaxation.   Screening Interventions   Interventions Encouraged to exercise;Program counselor consult      Quality of Life Scores:     Quality of Life - 01/21/15 1801    Quality of Life Scores   Health/Function Post 28.7 %   Health/Function % Change -1 %   Socioeconomic Post 28.43 %   Socioeconomic % Change 0 %   Psych/Spiritual Post 29.64 %   Psych/Spiritual % Change 2 %   Family Post 30 %   Family % Change 0 %   GLOBAL Post 29.03 %   GLOBAL % Change 0 %      PHQ-9:     Recent Review Flowsheet Data    Depression screen [facility] 2/9 01/21/2015 12/01/2014   Decreased Interest 0 0   Down, Depressed, Hopeless 0 0   PHQ - 2 Score 0 0   Altered sleeping 0 0   Tired, decreased energy 0 0   Change in appetite 0 0   Feeling bad or failure about yourself  0 0   Trouble concentrating 0 0   Moving slowly or fidgety/restless 0 0   Suicidal thoughts 0 0   PHQ-9 Score 0 0      Psychosocial Evaluation and Intervention:     Psychosocial Evaluation - 12/07/14 1657    Psychosocial Evaluation & Interventions   Interventions Stress management education;Relaxation education;Encouraged to exercise with the program and follow exercise prescription   Comments Counselor met with Mr. Zane today for initial psychosocial evaluation.  He is a 64 year old who had a heart attack seven weeks ago with (3) stents inserted.  He has a strong support system with a spouse of 16 years and adult children who live close by.  He is also actively involved in is faith  community.  Mr. Bourbon reports he is typically in good health and sleeps and eats well.  He denies a history of depression or current symptoms and admits to a history of "situational" anxiety which was prescribed medication for awhile back, but reports it wasn't effective and doesn't take anymore.  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 Counselor educated him on some stress management and encouraged to attend the educational class on this in this program. He has goals for increasing his strength and stamina and improving his diet and nutritional intake.  He admits to never having done aerobic exercise.  He is encouraged to consistently begin doing so and to meet with the dietician to accomplish his nutritional goals.   Continued Psychosocial Services Needed Yes  Mr. Rottinghaus will benefit from the stress management psychoeducational component of this program as well as engage in consistent exercise.       Psychosocial  Re-Evaluation:     Psychosocial Re-Evaluation      12/21/14 1802           Psychosocial Re-Evaluation   Interventions Encouraged to attend Cardiac Rehabilitation for the exercise       Comments Ralphie said it is stressful to feel the PVC's he does. He said he walks fast at work and doesn't pay any mind to them.           Vocational Rehabilitation: Provide vocational rehab assistance to qualifying candidates.   Vocational Rehab Evaluation & Intervention:     Vocational Rehab - 12/01/14 [phone]    Initial Vocational Rehab Evaluation & Intervention   Assessment shows need for Vocational Rehabilitation No      Education: Education Goals: Education classes will be provided on a weekly basis, covering required topics. Participant will state understanding/return demonstration of topics presented.  Learning Barriers/Preferences:     Learning Barriers/Preferences - 12/01/14 1829    Learning Barriers/Preferences    Learning Barriers None   Learning Preferences Written Material      Education Topics: General Nutrition Guidelines/Fats and Fiber: -Group instruction provided by verbal, written material, models and posters to present the general guidelines for heart healthy nutrition. Gives an explanation and review of dietary fats and fiber.          Cardiac Rehab from 01/20/2015 in [facility] Cardiac Rehab   Date  01/04/15   Educator  PI   Instruction Review Code  2- meets goals/outcomes      Controlling Sodium/Reading Food Labels: -Group verbal and written material supporting the discussion of sodium use in heart healthy nutrition. Review and explanation with models, verbal and written materials for utilization of the food label.      Cardiac Rehab from 01/20/2015 in [facility] Cardiac Rehab   Date  01/11/15   Educator  PI   Instruction Review Code  2- meets goals/outcomes      Exercise Physiology & Risk Factors: - Group verbal and written instruction with models to review the exercise physiology of the cardiovascular system and associated critical values. Details cardiovascular disease risk factors and the goals associated with each risk factor.   Aerobic Exercise & Resistance Training: - Gives group verbal and written discussion on the health impact of inactivity. On the components of aerobic and resistive training programs and the benefits of this training and how to safely progress through these programs.      Cardiac Rehab from 01/20/2015 in [facility] Cardiac Rehab   Date  12/07/14   Educator  SW   Instruction Review Code  2- meets goals/outcomes      Flexibility, Balance, General Exercise Guidelines: - Provides group verbal and written instruction on the benefits of flexibility and balance training programs. Provides general exercise guidelines with specific guidelines to those with heart or lung disease. Demonstration and skill practice provided.      Cardiac Rehab from 01/20/2015 in [facility] Cardiac  Rehab   Date  12/14/14   Educator  SW   Instruction Review Code  2- meets goals/outcomes      Stress Management: - Provides group verbal and written instruction about the health risks of elevated stress, cause of high stress, and healthy ways to reduce stress.      Cardiac Rehab from 01/20/2015 in [facility] Cardiac Rehab   Date  12/16/14   Educator  Luanna Cole   Instruction Review Code  2- meets goals/outcomes      Depression: - Provides group verbal and written  instruction on the correlation between heart/lung disease and depressed mood, treatment options, and the stigmas associated with seeking treatment.      Cardiac Rehab from 01/20/2015 in [facility] Cardiac Rehab   Date  01/20/15   Educator  [place]   Instruction Review Code  2- meets goals/outcomes      Anatomy & Physiology of the Heart: - Group verbal and written instruction and models provide basic cardiac anatomy and physiology, with the coronary electrical and arterial systems. Review of: AMI, Angina, Valve disease, Heart Failure, Cardiac Arrhythmia, Pacemakers, and the ICD.      Cardiac Rehab from 01/20/2015 in [facility] Cardiac Rehab   Date  12/21/14   Educator  CE   Instruction Review Code  2- meets goals/outcomes      Cardiac Procedures: - Group verbal and written instruction and models to describe the testing methods done to diagnose heart disease. Reviews the outcomes of the test results. Describes the treatment choices: Medical Management, Angioplasty, or Coronary Bypass Surgery.      Cardiac Rehab from 01/20/2015 in [facility] Cardiac Rehab   Date  01/18/15   Educator  SB   Instruction Review Code  2- meets goals/outcomes      Cardiac Medications: - Group verbal and written instruction to review commonly prescribed medications for heart disease. Reviews the medication, class of the drug, and side effects. Includes the steps to properly store meds and maintain the prescription regimen.   Go Sex-Intimacy & Heart Disease,  Get SMART - Goal Setting: - Group verbal and written instruction through game format to discuss heart disease and the return to sexual intimacy. Provides group verbal and written material to discuss and apply goal setting through the application of the S.M.A.R.T. Method.      Cardiac Rehab from 01/20/2015 in [facility] Cardiac Rehab   Date  01/18/15   Educator  SB   Instruction Review Code  2- meets goals/outcomes      Other Matters of the Heart: - Provides group verbal, written materials and models to describe Heart Failure, Angina, Valve Disease, and Diabetes in the realm of heart disease. Includes description of the disease process and treatment options available to the cardiac patient.      Cardiac Rehab from 01/20/2015 in [facility] Cardiac Rehab   Date  12/30/14   Educator  DW   Instruction Review Code  2- meets goals/outcomes      Exercise & Equipment Safety: - Individual verbal instruction and demonstration of equipment use and safety with use of the equipment.      Cardiac Rehab from 01/20/2015 in [facility] Cardiac Rehab   Date  12/01/14   Educator  SB   Instruction Review Code  2- meets goals/outcomes      Infection Prevention: - Provides verbal and written material to individual with discussion of infection control including proper hand washing and proper equipment cleaning during exercise session.      Cardiac Rehab from 01/20/2015 in [facility] Cardiac Rehab   Date  12/01/14   Educator  SB   Instruction Review Code  2- meets goals/outcomes      Falls Prevention: - Provides verbal and written material to individual with discussion of falls prevention and safety.      Cardiac Rehab from 01/20/2015 in [facility] Cardiac Rehab   Date  12/01/14   Educator  SB   Instruction Review Code  2- meets goals/outcomes      Diabetes: - Individual verbal and written instruction to review signs/symptoms of diabetes,  desired ranges of glucose level fasting, after meals and with exercise. Advice that pre  and post exercise glucose checks will be done for 3 sessions at entry of program.    Knowledge Questionnaire Score:     Knowledge Questionnaire Score - 01/21/15 1741    Knowledge Questionnaire Score   Post Score 27      Personal Goals and Risk Factors at Admission:     Personal Goals and Risk Factors at Admission - 12/01/14 0842    Personal Goals and Risk Factors on Admission   Increase Aerobic Exercise and Physical Activity Yes;Sedentary   Intervention While in program, learn and follow the exercise prescription taught. Start at a low level workload and increase workload after able to maintain previous level for 30 minutes. Increase time before increasing intensity.   Intervention Provide exercise education and an individualized exercise prescription that will provide continued progressive overload as per policy without signs/symptoms of physical distress.   Take Less Medication Yes   Diabetes No   Hypertension Yes   Goal Participant will see blood pressure controlled within the values of 140/36m/Hg or within value directed by their physician.   Intervention Provide nutrition & aerobic exercise along with prescribed medications to achieve BP 140/90 or less.   Lipids Yes   Goal Cholesterol controlled with medications as prescribed, with individualized exercise RX and with personalized nutrition plan. Value goals: LDL < 726m HDL > 4021mParticipant states understanding of desired cholesterol values and following prescriptions.   Intervention Provide nutrition & aerobic exercise along with prescribed medications to achieve LDL <60m49mDL >40mg66mStress Yes   Goal To meet with psychosocial counselor for stress and relaxation information and guidance. To state understanding of performing relaxation techniques and or identifying personal stressors.   Intervention Provide education on types of stress, identifiying stressors, and ways to cope with stress. Provide demonstration and active  practice of relaxation techniques.      Personal Goals and Risk Factors Review:      Goals and Risk Factor Review      12/21/14 1800 12/24/14 1723 01/06/15 1822 01/18/15 1801     Increase Aerobic Exercise and Physical Activity   Goals Progress/Improvement seen  Yes Yes Yes Yes    Comments Is able to go 2.8mph 72mTreadmill but started having PVC's again. EP MD appt not till Nov so faxed to Dr. GilberRosanna [facility] SpenceTrystinto fax info to) his PVC and fyi that potassium, calcium etc blood work has not been drawn since he was in the hospital. SpenceDennardiven info about our independent gym and the [employer] PVC on 2% incline today on the Treadmill. No c/o from SpenceHunnewellng well, is concerned that he is not getting his heartrate up over 120.  Reviewed the fact that he is 50 plus beats over resting and that he is working an RPE between 11 and 15.  He said he will remember that as he looks at his exercise regimen. He will see all three MD's next week and will review his Heartrate, his ectopics and his medications during the visits.    Take Less Medication   Goals Progress/Improvement seen    No    Comments    No changes yet.  Does see 3 doctors next week.    Hypertension   Goal    Participant will see blood pressure controlled within the values of 140/90mm/H61m within value directed by their physician.  Progress seen toward goals    Yes    Comments    Matthias does come in occasionally with a BP diastolic above 70([employer] after work). His BP readings at end of session are all in good range. He does see his MD next week and will review BP readings then.    Abnormal Lipids   Goal    Cholesterol controlled with medications as prescribed, with individualized exercise RX and with personalized nutrition plan. Value goals: LDL < 66m, HDL > 471m Participant states understanding of desired cholesterol values and following prescriptions.    Progress seen towards goals    Yes     Comments    SpMontfords exercising and working on his nutrition to maintain cholesterol levels.    Stress   Goal    To meet with psychosocial counselor for stress and relaxation information and guidance. To state understanding of performing relaxation techniques and or identifying personal stressors.    Progress seen towards goals    Yes    Comments    SpZhitates that he is doing well managing his stress. He does recognize what causes the stress and will avoid the stress inducing factors if possible.       Personal Goals Discharge (Final Personal Goals and Risk Factors Review):      Goals and Risk Factor Review - 01/18/15 1801    Increase Aerobic Exercise and Physical Activity   Goals Progress/Improvement seen  Yes   Comments Doing well, is concerned that he is not getting his heartrate up over 120.  Reviewed the fact that he is 50 plus beats over resting and that he is working an RPE between 11 and 15.  He said he will remember that as he looks at his exercise regimen. He will see all three MD's next week and will review his Heartrate, his ectopics and his medications during the visits.   Take Less Medication   Goals Progress/Improvement seen No   Comments No changes yet.  Does see 3 doctors next week.   Hypertension   Goal Participant will see blood pressure controlled within the values of 140/9022mg or within value directed by their physician.   Progress seen toward goals Yes   Comments SpeNatalees come in occasionally with a BP diastolic above 90(28(BTDVVter work). His BP readings at end of session are all in good range. He does see his MD next week and will review BP readings then.   Abnormal Lipids   Goal Cholesterol controlled with medications as prescribed, with individualized exercise RX and with personalized nutrition plan. Value goals: LDL < 77m10mDL > 40mg22mrticipant states understanding of desired cholesterol values and following prescriptions.   Progress seen towards  goals Yes   Comments SpencAaravxercising and working on his nutrition to maintain cholesterol levels.   Stress   Goal To meet with psychosocial counselor for stress and relaxation information and guidance. To state understanding of performing relaxation techniques and or identifying personal stressors.   Progress seen towards goals Yes   Comments SpencDeejayes that he is doing well managing his stress. He does recognize what causes the stress and will avoid the stress inducing factors if possible.       Comments: Has completed 34/ soon to complete 36/36 visits.

## 2015-01-21 NOTE — Patient Instructions (Signed)
Discharge Instructions  Patient Details  Name: Kevin Moore MRN: [id number] Date of Birth: Aug 21, 1950 Referring Provider:  [name], MD   Number of Visits: 34 soon to complete 36 /36  Reason for Discharge:  Patient reached a stable level of exercise. Patient independent in their exercise.  Smoking History:  History  Smoking status  . Never Smoker   Smokeless tobacco  . Not on file    Diagnosis:  ST elevation myocardial infarction (STEMI), unspecified artery [facility])  Initial Exercise Prescription:     Initial Exercise Prescription - 12/01/14 1000    Date of Initial Exercise Prescription   Date 12/01/14   Treadmill   MPH 2.8   Grade 0   Minutes 15   Bike   Level 0.4   Minutes 10   Recumbant Bike   Level 3   RPM 45   Watts 25   Minutes 15   NuStep   Level 3   Watts 40   Minutes 15   Arm Ergometer   Level 1   Watts 8   Minutes 10   Arm/Foot Ergometer   Level 4   Watts 15   Minutes 10   Cybex   Level 1   RPM 60   Minutes 10   Recumbant Elliptical   Level 1   RPM 40   Watts 10   Minutes 10   Elliptical   Level 1   Speed 3   Minutes 1   REL-XR   Level 2   Watts 35   Minutes 15   Prescription Details   Frequency (times per week) 3   Duration Progress to 30 minutes of continuous aerobic without signs/symptoms of physical distress   Intensity   THRR REST +  30   Ratings of Perceived Exertion 11-15   Progression Continue progressive overload as per policy without signs/symptoms or physical distress.   Resistance Training   Training Prescription Yes   Weight 2   Reps 10-15      Discharge Exercise Prescription (Final Exercise Prescription Changes):     Exercise Prescription Changes - 01/04/15 1600    Exercise Review   Progression Yes   Response to Exercise   Rating of Perceived Exertion (Exercise) 12   Symptoms None   Comments A new target heart rate range for exercise was calculated based on the patient's ability to  exercise with increased intensity. The range is 40-85% of HRR and encompasses moderate-vigorous exercise. The target heart rate range is equivalent to an RPE of 12-17. [place] is exercising at home 2 d/wk in addition to HT. He is exericising on his bike or walking in his neighborhood for 30 minutes per session.    Duration Progress to 30 minutes of continuous aerobic without signs/symptoms of physical distress   Intensity Other (comment)  40-80% of HRR 99-149   Progression Continue progressive overload as per policy without signs/symptoms or physical distress.   Resistance Training   Training Prescription Yes   Weight 5   Reps 10-15   Interval Training   Interval Training Yes   Equipment REL-XR   Comments L8/80 watts for 3 min; Level 10-12/120 watts for 1 min; repeat   Treadmill   MPH 3   Grade 2   Minutes 20   NuStep   Level 3   Watts 50   Minutes 15   Elliptical   Level 2   Speed 3   Minutes 10   REL-XR   Level  2   Watts 55   Minutes 15      Functional Capacity:     6 Minute Walk      12/01/14 1020 01/21/15 1647     6 Minute Walk   Phase Initial Discharge    Distance 1675 feet 1685 feet    Distance % Change  1 %    Walk Time 6 minutes 6 minutes    Resting HR 48 bpm 80 bpm    Resting BP 182/90 mmHg 122/78 mmHg    Max Ex. HR 84 bpm 112 bpm    Max Ex. BP 188/90 mmHg 142/76 mmHg    RPE 11 12    Perceived Dyspnea   0    Symptoms No No       Quality of Life:     Quality of Life - 01/21/15 1801    Quality of Life Scores   Health/Function Post 28.7 %   Health/Function % Change -1 %   Socioeconomic Post 28.43 %   Socioeconomic % Change 0 %   Psych/Spiritual Post 29.64 %   Psych/Spiritual % Change 2 %   Family Post 30 %   Family % Change 0 %   GLOBAL Post 29.03 %   GLOBAL % Change 0 %      Personal Goals: Goals established at orientation with interventions provided to work toward goal.     Personal Goals and Risk Factors at Admission - 12/01/14 0842     Personal Goals and Risk Factors on Admission   Increase Aerobic Exercise and Physical Activity Yes;Sedentary   Intervention While in program, learn and follow the exercise prescription taught. Start at a low level workload and increase workload after able to maintain previous level for 30 minutes. Increase time before increasing intensity.   Intervention Provide exercise education and an individualized exercise prescription that will provide continued progressive overload as per policy without signs/symptoms of physical distress.   Take Less Medication Yes   Diabetes No   Hypertension Yes   Goal Participant will see blood pressure controlled within the values of 140/75m/Hg or within value directed by their physician.   Intervention Provide nutrition & aerobic exercise along with prescribed medications to achieve BP 140/90 or less.   Lipids Yes   Goal Cholesterol controlled with medications as prescribed, with individualized exercise RX and with personalized nutrition plan. Value goals: LDL < 711m HDL > 4023mParticipant states understanding of desired cholesterol values and following prescriptions.   Intervention Provide nutrition & aerobic exercise along with prescribed medications to achieve LDL <63m43mDL >40mg27mStress Yes   Goal To meet with psychosocial counselor for stress and relaxation information and guidance. To state understanding of performing relaxation techniques and or identifying personal stressors.   Intervention Provide education on types of stress, identifiying stressors, and ways to cope with stress. Provide demonstration and active practice of relaxation techniques.       Personal Goals Discharge:     Goals and Risk Factor Review - 01/18/15 1801    Increase Aerobic Exercise and Physical Activity   Goals Progress/Improvement seen  Yes   Comments Doing well, is concerned that he is not getting his heartrate up over 120.  Reviewed the fact that he is 50 plus beats  over resting and that he is working an RPE between 11 and 15.  He said he will remember that as he looks at his exercise regimen. He will see all three MD's next week and  will review his Heartrate, his ectopics and his medications during the visits.   Take Less Medication   Goals Progress/Improvement seen No   Comments No changes yet.  Does see 3 doctors next week.   Hypertension   Goal Participant will see blood pressure controlled within the values of 140/38m/Hg or within value directed by their physician.   Progress seen toward goals Yes   Comments SReeddoes come in occasionally with a BP diastolic above 989(BOERQafter work). His BP readings at end of session are all in good range. He does see his MD next week and will review BP readings then.   Abnormal Lipids   Goal Cholesterol controlled with medications as prescribed, with individualized exercise RX and with personalized nutrition plan. Value goals: LDL < 722m HDL > 4033mParticipant states understanding of desired cholesterol values and following prescriptions.   Progress seen towards goals Yes   Comments SpeStokes exercising and working on his nutrition to maintain cholesterol levels.   Stress   Goal To meet with psychosocial counselor for stress and relaxation information and guidance. To state understanding of performing relaxation techniques and or identifying personal stressors.   Progress seen towards goals Yes   Comments SpeAlecxisates that he is doing well managing his stress. He does recognize what causes the stress and will avoid the stress inducing factors if possible.      Nutrition & Weight - Outcomes:     Pre Biometrics - 12/01/14 1041    Pre Biometrics   Height 6' 1.88" (1.877 m)   Weight 213 lb 3.2 oz (96.707 kg)   BMI (Calculated) 27.5       Nutrition:     Nutrition Therapy & Goals - 12/11/14 0901    Nutrition Therapy   Diet DASH   Drug/Food Interactions Statins/Certain Fruits   Fiber 30 grams    Whole Grain Foods 3 servings   Protein 8 ounces/day   Saturated Fats 14 max. grams   Fruits and Vegetables 8 servings/day   Personal Nutrition Goals   Personal Goal #1 Continue with heart healthy eating pattern.       Nutrition Discharge:     Rate Your Plate - 10/41/28/2038138 Rate Your Plate Scores   Post Score 76   Post Score % 84 %      Education Questionnaire Score:     Knowledge Questionnaire Score - 01/21/15 1741    Knowledge Questionnaire Score   Post Score 27      Goals reviewed with patient; copy given to patient.

## 2015-01-25 ENCOUNTER — Encounter: Payer: Self-pay | Admitting: *Deleted

## 2015-01-25 DIAGNOSIS — I213 ST elevation (STEMI) myocardial infarction of unspecified site: Secondary | ICD-10-CM

## 2015-01-25 NOTE — Progress Notes (Signed)
Daily Session Note  Patient Details  Name: Kevin Moore MRN: [id number] Date of Birth: 03/15/1951 Referring Provider:  [name], MD  Encounter Date: 01/25/2015  Check In:     Session Check In - 01/25/15 1620    Check-In   Staff Present Heath Lark RN, BSN, CCRP;Lauran Romanski BS, ACSM EP-C, Exercise Physiologist;Carroll Enterkin RN, BSN   ER physicians immediately available to respond to emergencies See telemetry face sheet for immediately available ER MD   Medication changes reported     No   Fall or balance concerns reported    No   Warm-up and Cool-down Performed on first and last piece of equipment   VAD Patient? No   Pain Assessment   Currently in Pain? No/denies           Exercise Prescription Changes - 01/25/15 1100    Exercise Review   Progression No  No increases due to symptoms   Response to Exercise   Blood Pressure (Admit) 122/78 mmHg   Blood Pressure (Exercise) 142/76 mmHg   Blood Pressure (Exit) 110/78 mmHg   Heart Rate (Admit) 84 bpm   Heart Rate (Exercise) 134 bpm   Heart Rate (Exit) 65 bpm   Rating of Perceived Exertion (Exercise) 12   Symptoms With increased effort Drayven goes into bigeminy even after doing a prolonged warm up. These concerns have been sent to his MD. He presents no symptoms.   Comments No increases are being made due to consistent bouts of bigeminy. Jedidiah is cleared to resume current workloads and no more interval training.    Duration Progress to 30 minutes of continuous aerobic without signs/symptoms of physical distress   Intensity Other (comment)  40-80% of HRR 99-149   Progression Continue progressive overload as per policy without signs/symptoms or physical distress.   Resistance Training   Training Prescription Yes   Weight 5   Reps 10-15   Interval Training   Interval Training No   Treadmill   MPH 3   Grade 2   Minutes 20   NuStep   Level 3   Watts 50   Minutes 15   Elliptical   Level 2   Speed 3   Minutes 10   REL-XR   Level 2   Watts 55   Minutes 15      Goals Met:  Proper associated with RPD/PD & O2 Sat Exercise tolerated well No report of cardiac concerns or symptoms Strength training completed today  Goals Unmet:  Not Applicable  Goals Comments:    Dr. Emily Filbert is Medical Director for [facility] and [facility].

## 2015-01-26 DIAGNOSIS — I493 Ventricular premature depolarization: Secondary | ICD-10-CM | POA: Insufficient documentation

## 2015-01-26 DIAGNOSIS — I472 Ventricular tachycardia, unspecified: Secondary | ICD-10-CM | POA: Insufficient documentation

## 2015-01-27 ENCOUNTER — Encounter: Payer: BLUE CROSS/BLUE SHIELD | Attending: Cardiology | Admitting: *Deleted

## 2015-01-27 DIAGNOSIS — I213 ST elevation (STEMI) myocardial infarction of unspecified site: Secondary | ICD-10-CM | POA: Diagnosis not present

## 2015-01-27 DIAGNOSIS — Z9861 Coronary angioplasty status: Secondary | ICD-10-CM | POA: Diagnosis present

## 2015-01-27 NOTE — Progress Notes (Signed)
Daily Session Note  Patient Details  Name: Kevin Moore MRN: [id number] Date of Birth: 1950-09-25 Referring Provider:  [name], MD  Encounter Date: 01/27/2015  Check In:     Session Check In - 01/27/15 1657    Check-In   Staff Present Nyoka Cowden RN;Susanne Bice RN, BSN, CCRP;Renee Dillard [place] MS, ACSM CEP Exercise Physiologist   ER physicians immediately available to respond to emergencies See telemetry face sheet for immediately available ER MD   Medication changes reported     No   Fall or balance concerns reported    No   Warm-up and Cool-down Performed on first and last piece of equipment   VAD Patient? No   Pain Assessment   Currently in Pain? No/denies         Goals Met:  Independence with exercise equipment Exercise tolerated well No report of cardiac concerns or symptoms  Goals Unmet:  Not Applicable  Goals Comments: [name] discharged from program today.    Dr. Emily Filbert is Medical Director for [facility] and [facility].

## 2015-01-28 ENCOUNTER — Ambulatory Visit: Payer: BLUE CROSS/BLUE SHIELD | Admitting: Family Medicine

## 2015-02-02 ENCOUNTER — Encounter: Payer: Self-pay | Admitting: *Deleted

## 2015-02-02 DIAGNOSIS — Z9861 Coronary angioplasty status: Secondary | ICD-10-CM

## 2015-02-02 DIAGNOSIS — I213 ST elevation (STEMI) myocardial infarction of unspecified site: Secondary | ICD-10-CM

## 2015-02-02 NOTE — Progress Notes (Signed)
Cardiac Individual Treatment Plan  Patient Details  Name: Kevin Moore MRN: [id number] Date of Birth: December 28, 1950 Referring Provider:  [name], MD  Initial Encounter Date:    Visit Diagnosis: ST elevation myocardial infarction (STEMI), unspecified artery ([place])  S/P PTCA (percutaneous transluminal coronary angioplasty)  Patient's Home Medications on Admission:  Current outpatient prescriptions:  .  aspirin 81 MG tablet, Take by mouth., Disp: , Rfl:  .  atorvastatin (LIPITOR) 80 MG tablet, Take 80 mg by mouth., Disp: , Rfl:  .  losartan (COZAAR) 100 MG tablet, Take 1 tablet (100 mg total) by mouth daily., Disp: 90 tablet, Rfl: 3 .  omeprazole (PRILOSEC) 20 MG capsule, Take by mouth., Disp: , Rfl:  .  ticagrelor (BRILINTA) 90 MG TABS tablet, Take 90 mg by mouth., Disp: , Rfl:   Past Medical History: No past medical history on file.  Tobacco Use: History  Smoking status  . Never Smoker   Smokeless tobacco  . Not on file    Labs: Recent Review Flowsheet Data    Labs for ITP Cardiac and Pulmonary Rehab Latest Ref Rng 06/11/2014   Cholestrol 0 - 200 mg/dL 232(A)   LDLCALC - 167   HDL 35 - 70 mg/dL 46   Trlycerides 40 - 160 mg/dL 94       Exercise Target Goals:    Exercise Program Goal: Individual exercise prescription set with THRR, safety & activity barriers. Participant demonstrates ability to understand and report RPE using BORG scale, to self-measure pulse accurately, and to acknowledge the importance of the exercise prescription.  Exercise Prescription Goal: Starting with aerobic activity 30 plus minutes a day, 3 days per week for initial exercise prescription. Provide home exercise prescription and guidelines that participant acknowledges understanding prior to discharge.  Activity Barriers & Risk Stratification:     Activity Barriers & Risk Stratification - 12/01/14 0830    Activity Barriers & Risk Stratification   Activity Barriers Joint  Problems;Deconditioning   Risk Stratification Moderate      6 Minute Walk:     6 Minute Walk      12/01/14 1020 01/21/15 1647     6 Minute Walk   Phase Initial Discharge    Distance 1675 feet 1685 feet    Distance % Change  1 %    Walk Time 6 minutes 6 minutes    Resting HR 48 bpm 80 bpm    Resting BP 182/90 mmHg 122/78 mmHg    Max Ex. HR 84 bpm 112 bpm    Max Ex. BP 188/90 mmHg 142/76 mmHg    RPE 11 12    Perceived Dyspnea   0    Symptoms No No       Initial Exercise Prescription:     Initial Exercise Prescription - 12/01/14 1000    Date of Initial Exercise Prescription   Date 12/01/14   Treadmill   MPH 2.8   Grade 0   Minutes 15   Bike   Level 0.4   Minutes 10   Recumbant Bike   Level 3   RPM 45   Watts 25   Minutes 15   NuStep   Level 3   Watts 40   Minutes 15   Arm Ergometer   Level 1   Watts 8   Minutes 10   Arm/Foot Ergometer   Level 4   Watts 15   Minutes 10   Cybex   Level 1   RPM 60  Minutes 10   Recumbant Elliptical   Level 1   RPM 40   Watts 10   Minutes 10   Elliptical   Level 1   Speed 3   Minutes 1   REL-XR   Level 2   Watts 35   Minutes 15   Prescription Details   Frequency (times per week) 3   Duration Progress to 30 minutes of continuous aerobic without signs/symptoms of physical distress   Intensity   THRR REST +  30   Ratings of Perceived Exertion 11-15   Progression Continue progressive overload as per policy without signs/symptoms or physical distress.   Resistance Training   Training Prescription Yes   Weight 2   Reps 10-15      Exercise Prescription Changes:     Exercise Prescription Changes      12/07/14 0600 12/07/14 0640 12/23/14 1700 12/24/14 1700 12/29/14 0800   Exercise Review   Progression No  Only one visit recorded No  Only one visit recorded Yes  Yes   Response to Exercise   Blood Pressure (Admit)  146/92 mmHg   146/82 mmHg   Blood Pressure (Exercise)  182/90 mmHg   134/78 mmHg   Blood  Pressure (Exit)  172/80 mmHg   108/62 mmHg   Heart Rate (Admit)  70 bpm   61 bpm   Heart Rate (Exercise)  119 bpm   72 bpm   Heart Rate (Exit)  60 bpm   59 bpm   Rating of Perceived Exertion (Exercise)  11   12   Symptoms  Yes None  None   Comments  PVCs and Bigeminy. Stopped on treadmill due to rhythm and finished on recumbent equipment Reviewed individualized exercise prescription and made increases per departmental policy. Exercise increases were discussed with the patient and they were able to perform the new work loads without issue (no signs or symptoms). Kevin Moore was very compliant with the interval training and did very well with the first program. An interval program card was put in his chart and he will use this in every class. Given info about the [employer] gym and also Kevin Moore given info about the [employer] program for when he finishes Cardiac rehab.  Discussed adding one day a week of exercise at home in addition to the three days a week at [facility]. Explained that 150 minutes a week of exercise is the goal for combating and managing chronic health conditions. This volume of exercise is also proven to give other health benefits.    Frequency     Add 2 additional days to program exercise sessions.   Duration  Progress to 30 minutes of continuous aerobic without signs/symptoms of physical distress Progress to 30 minutes of continuous aerobic without signs/symptoms of physical distress  Progress to 30 minutes of continuous aerobic without signs/symptoms of physical distress   Intensity  Rest + 30 Rest + 30  Rest + 30   Progression  Continue progressive overload as per policy without signs/symptoms or physical distress. Continue progressive overload as per policy without signs/symptoms or physical distress.  Continue progressive overload as per policy without signs/symptoms or physical distress.   Resistance Training   Training Prescription  Yes Yes  Yes   Weight  _0 Reps  10-15  10-15  10-15   Interval Training   Interval Training  No Yes  Yes   Equipment   REL-XR  REL-XR   Comments   L7/85  watts for 3 min; Level 10-11/120 watts for 1 min; repeat  L7/85 watts for 3 min; Level 10-11/120 watts for 1 min; repeat   Treadmill   MPH  2.5 2.5  3   Grade  0 0  2   Minutes  _0 NuStep   Level  _1 Watts  50 50  50   Minutes  _2 Elliptical   Level     2   Speed     3   Minutes     10   REL-XR   Level  _3 Watts  55 55  55   Minutes  _4 12/30/14 1800 01/04/15 1600 01/25/15 1100       Exercise Review   Progression Yes Yes No  No increases due to symptoms     Response to Exercise   Blood Pressure (Admit)   122/78 mmHg     Blood Pressure (Exercise)   142/76 mmHg     Blood Pressure (Exit)   110/78 mmHg     Heart Rate (Admit)   84 bpm     Heart Rate (Exercise)   134 bpm     Heart Rate (Exit)   65 bpm     Rating of Perceived Exertion (Exercise) _5 Symptoms None None With increased effort Vander goes into bigeminy even after doing a prolonged warm up. These concerns have been sent to his MD. He presents no symptoms.     Comments A new target heart rate range for exercise was calculated based on the patient's ability to exercise with increased intensity. The range is 40-85% of HRR and encompasses moderate-vigorous exercise. The target heart rate range is equivalent to an RPE of 12-17. Bryon is exercising at home 2 d/wk in addition to HT. He is exericising on his bike or walking in his neighborhood for 30 minutes per session.  A new target heart rate range for exercise was calculated based on the patient's ability to exercise with increased intensity. The range is 40-85% of HRR and encompasses moderate-vigorous exercise. The target heart rate range is equivalent to an RPE of 12-17. Ladd is exercising at home 2 d/wk in addition to HT. He is exericising on his bike or walking in his neighborhood for 30 minutes per session.   No increases are being made due to consistent bouts of bigeminy. Vic is cleared to resume current workloads and no more interval training.      Duration Progress to 30 minutes of continuous aerobic without signs/symptoms of physical distress Progress to 30 minutes of continuous aerobic without signs/symptoms of physical distress Progress to 30 minutes of continuous aerobic without signs/symptoms of physical distress     Intensity Other (comment)  40-80% of HRR 99-149 Other (comment)  40-80% of HRR 99-149 Other (comment)  40-80% of HRR 99-149     Progression Continue progressive overload as per policy without signs/symptoms or physical distress. Continue progressive overload as per policy without signs/symptoms or physical distress. Continue progressive overload as per policy without signs/symptoms or physical distress.     Resistance Training   Training Prescription Yes Yes Yes     Weight _6 Reps 10-15 10-15 10-15     Interval Training   Interval Training Yes Yes No     Equipment  REL-XR REL-XR      Comments L7/85 watts for 3 min; Level 10-11/120 watts for 1 min; repeat L8/80 watts for 3 min; Level 10-12/120 watts for 1 min; repeat      Treadmill   MPH _0 Grade _1 Minutes _2 NuStep   Level _3 Watts 50 50 50     Minutes _4 Elliptical   Level _5 Speed _6 Minutes _7 REL-XR   Level _8 Watts 55 55 55     Minutes _9 Discharge Exercise Prescription (Final Exercise Prescription Changes):     Exercise Prescription Changes - 01/25/15 1100    Exercise Review   Progression No  No increases due to symptoms   Response to Exercise   Blood Pressure (Admit) 122/78 mmHg   Blood Pressure (Exercise) 142/76 mmHg   Blood Pressure (Exit) 110/78 mmHg   Heart Rate (Admit) 84 bpm   Heart Rate (Exercise) 134 bpm   Heart Rate (Exit) 65 bpm   Rating of Perceived Exertion (Exercise) 12   Symptoms With  increased effort Aadyn goes into bigeminy even after doing a prolonged warm up. These concerns have been sent to his MD. He presents no symptoms.   Comments No increases are being made due to consistent bouts of bigeminy. Nicholad is cleared to resume current workloads and no more interval training.    Duration Progress to 30 minutes of continuous aerobic without signs/symptoms of physical distress   Intensity Other (comment)  40-80% of HRR 99-149   Progression Continue progressive overload as per policy without signs/symptoms or physical distress.   Resistance Training   Training Prescription Yes   Weight 5   Reps 10-15   Interval Training   Interval Training No   Treadmill   MPH 3   Grade 2   Minutes 20   NuStep   Level 3   Watts 50   Minutes 15   Elliptical   Level 2   Speed 3   Minutes 10   REL-XR   Level 2   Watts 55   Minutes 15      Nutrition:  Target Goals: Understanding of nutrition guidelines, daily intake of sodium <1576m, cholesterol <2093m calories 30% from fat and 7% or less from saturated fats, daily to have 5 or more servings of fruits and vegetables.  Biometrics:     Pre Biometrics - 12/01/14 1041    Pre Biometrics   Height 6' 1.88" (1.877 m)   Weight 213 lb 3.2 oz (96.707 kg)   BMI (Calculated) 27.5       Nutrition Therapy Plan and Nutrition Goals:     Nutrition Therapy & Goals - 12/11/14 0901    Nutrition Therapy   Diet DASH   Drug/Food Interactions Statins/Certain Fruits   Fiber 30 grams   Whole Grain Foods 3 servings   Protein 8 ounces/day   Saturated Fats 14 max. grams   Fruits and Vegetables 8 servings/day   Personal Nutrition Goals   Personal Goal #1 Continue with heart healthy eating pattern.       Nutrition Discharge: Rate Your Plate Scores:     Rate Your Plate -  02/02/15 0854    Rate Your Plate Scores   Pre Score 70   Pre Score % 78 %   Post Score 76   Post Score % 84 %   % Change 6 %      Nutrition Goals  Re-Evaluation:     Nutrition Goals Re-Evaluation      12/21/14 1759 12/21/14 1802 01/18/15 1759       Personal Goal #1 Re-Evaluation   Personal Goal #1 Has met with dietician and cutting back on Fats. Is glad to know he can eat more than grilled chicken like healthy fast food choices even.   Continues to watch his sodium intake as well as saturated fats.  "If I don't fix it ,I don't eat it" when reviewing sodium restrictions.     Comments  I discussed caffeine intake with Frederico Hamman to try to prevent PVC but he said MD said to drink some caffeine to help with shortness of breath that he feels with Brilianta medicine.       Personal Goal #2 Re-Evaluation   Personal Goal #2 Labarron stated he has eaten healthy in his later life but his early life eating habits are catching up with him.           Psychosocial: Target Goals: Acknowledge presence or absence of depression, maximize coping skills, provide positive support system. Participant is able to verbalize types and ability to use techniques and skills needed for reducing stress and depression.  Initial Review & Psychosocial Screening:     Initial Psych Review & Screening - 12/01/14 [place]? Yes   Barriers   Psychosocial barriers to participate in program There are no identifiable barriers or psychosocial needs.;The patient should benefit from training in stress management and relaxation.   Screening Interventions   Interventions Encouraged to exercise;Program counselor consult      Quality of Life Scores:     Quality of Life - 02/02/15 0854    Quality of Life Scores   Health/Function Pre 29.2 %   Health/Function Post 28.7 %   Health/Function % Change -1 %   Socioeconomic Pre 28.32 %   Socioeconomic Post 28.43 %   Socioeconomic % Change 0 %   Psych/Spiritual Pre 28.93 %   Psych/Spiritual Post 29.64 %   Psych/Spiritual % Change 2 %   Family Pre 30 %   Family Post 30 %   Family % Change 0  %   GLOBAL Pre 29.06 %   GLOBAL Post 29.03 %   GLOBAL % Change 0 %      PHQ-9:     Recent Review Flowsheet Data    Depression screen [facility] 2/9 01/21/2015 12/01/2014   Decreased Interest 0 0   Down, Depressed, Hopeless 0 0   PHQ - 2 Score 0 0   Altered sleeping 0 0   Tired, decreased energy 0 0   Change in appetite 0 0   Feeling bad or failure about yourself  0 0   Trouble concentrating 0 0   Moving slowly or fidgety/restless 0 0   Suicidal thoughts 0 0   PHQ-9 Score 0 0      Psychosocial Evaluation and Intervention:     Psychosocial Evaluation - 12/07/14 1657    Psychosocial Evaluation & Interventions   Interventions Stress management education;Relaxation education;Encouraged to exercise with the program and follow exercise prescription   Comments Counselor met with Mr. Florek today for initial psychosocial evaluation.  He is a 64 year old who had a heart attack seven weeks ago with (3) stents inserted.  He has a strong support system with a spouse of 40 years and adult children who live close by.  He is also actively involved in is faith community.  Mr. Geraldo reports he is typically in good health and sleeps and eats well.  He denies a history of depression or current symptoms and admits to a history of "situational" anxiety which was prescribed medication for awhile back, but reports it wasn't effective and doesn't take anymore.  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 Counselor educated him on some stress management and encouraged to attend the educational class on this in this program. He has goals for increasing his strength and stamina and improving his diet and nutritional intake.  He admits to never having done aerobic exercise.  He is encouraged to consistently begin doing so and to meet with the dietician to accomplish his nutritional goals.   Continued Psychosocial Services Needed Yes  Mr. Mcwethy will benefit from  the stress management psychoeducational component of this program as well as engage in consistent exercise.       Psychosocial Re-Evaluation:     Psychosocial Re-Evaluation      12/21/14 1802           Psychosocial Re-Evaluation   Interventions Encouraged to attend Cardiac Rehabilitation for the exercise       Comments Jamarious said it is stressful to feel the PVC's he does. He said he walks fast at work and doesn't pay any mind to them.           Vocational Rehabilitation: Provide vocational rehab assistance to qualifying candidates.   Vocational Rehab Evaluation & Intervention:     Vocational Rehab - 12/01/14 [phone]    Initial Vocational Rehab Evaluation & Intervention   Assessment shows need for Vocational Rehabilitation No      Education: Education Goals: Education classes will be provided on a weekly basis, covering required topics. Participant will state understanding/return demonstration of topics presented.  Learning Barriers/Preferences:     Learning Barriers/Preferences - 12/01/14 9621    Learning Barriers/Preferences   Learning Barriers None   Learning Preferences Written Material      Education Topics: General Nutrition Guidelines/Fats and Fiber: -Group instruction provided by verbal, written material, models and posters to present the general guidelines for heart healthy nutrition. Gives an explanation and review of dietary fats and fiber.          Cardiac Rehab from 01/27/2015 in [facility] Cardiac Rehab   Date  01/04/15   Educator  PI   Instruction Review Code  2- meets goals/outcomes      Controlling Sodium/Reading Food Labels: -Group verbal and written material supporting the discussion of sodium use in heart healthy nutrition. Review and explanation with models, verbal and written materials for utilization of the food label.      Cardiac Rehab from 01/27/2015 in [facility] Cardiac Rehab   Date  01/11/15   Educator  PI   Instruction Review Code  2- meets  goals/outcomes      Exercise Physiology & Risk Factors: - Group verbal and written instruction with models to review the exercise physiology of the cardiovascular system and associated critical values. Details cardiovascular disease risk factors and the goals associated with each risk factor.      Cardiac Rehab from 01/27/2015 in [facility] Cardiac Rehab  Date  01/27/15   Educator  RM   Instruction Review Code  2- meets goals/outcomes      Aerobic Exercise & Resistance Training: - Gives group verbal and written discussion on the health impact of inactivity. On the components of aerobic and resistive training programs and the benefits of this training and how to safely progress through these programs.      Cardiac Rehab from 01/27/2015 in [facility] Cardiac Rehab   Date  12/07/14   Educator  SW   Instruction Review Code  2- meets goals/outcomes      Flexibility, Balance, General Exercise Guidelines: - Provides group verbal and written instruction on the benefits of flexibility and balance training programs. Provides general exercise guidelines with specific guidelines to those with heart or lung disease. Demonstration and skill practice provided.      Cardiac Rehab from 01/27/2015 in [facility] Cardiac Rehab   Date  12/14/14   Educator  SW   Instruction Review Code  2- meets goals/outcomes      Stress Management: - Provides group verbal and written instruction about the health risks of elevated stress, cause of high stress, and healthy ways to reduce stress.      Cardiac Rehab from 01/27/2015 in [place]   Date  12/16/14   Educator  Luanna Cole   Instruction Review Code  2- meets goals/outcomes      Depression: - Provides group verbal and written instruction on the correlation between heart/lung disease and depressed mood, treatment options, and the stigmas associated with seeking treatment.      Cardiac Rehab from 01/27/2015 in [facility] Cardiac Rehab   Date  01/20/15   Educator  O[place]o    Instruction Review Code  2- meets goals/outcomes      Anatomy & Physiology of the Heart: - Group verbal and written instruction and models provide basic cardiac anatomy and physiology, with the coronary electrical and arterial systems. Review of: AMI, Angina, Valve disease, Heart Failure, Cardiac Arrhythmia, Pacemakers, and the ICD.      Cardiac Rehab from 01/27/2015 in [facility] Cardiac Rehab   Date  12/21/14   Educator  CE   Instruction Review Code  2- meets goals/outcomes      Cardiac Procedures: - Group verbal and written instruction and models to describe the testing methods done to diagnose heart disease. Reviews the outcomes of the test results. Describes the treatment choices: Medical Management, Angioplasty, or Coronary Bypass Surgery.      Cardiac Rehab from 01/27/2015 in [facility] Cardiac Rehab   Date  01/18/15   Educator  SB   Instruction Review Code  2- meets goals/outcomes      Cardiac Medications: - Group verbal and written instruction to review commonly prescribed medications for heart disease. Reviews the medication, class of the drug, and side effects. Includes the steps to properly store meds and maintain the prescription regimen.      Cardiac Rehab from 01/27/2015 in [facility] Cardiac Rehab   Date  01/25/15   Educator  SB   Instruction Review Code  2- meets goals/outcomes      Go Sex-Intimacy & Heart Disease, Get SMART - Goal Setting: - Group verbal and written instruction through game format to discuss heart disease and the return to sexual intimacy. Provides group verbal and written material to discuss and apply goal setting through the application of the S.M.A.R.T. Method.      Cardiac Rehab from 01/27/2015 in [facility] Cardiac Rehab   Date  01/18/15  Educator  SB   Instruction Review Code  2- meets goals/outcomes      Other Matters of the Heart: - Provides group verbal, written materials and models to describe Heart Failure, Angina, Valve Disease, and Diabetes in the  realm of heart disease. Includes description of the disease process and treatment options available to the cardiac patient.      Cardiac Rehab from 01/27/2015 in [facility] Cardiac Rehab   Date  12/30/14   Educator  DW   Instruction Review Code  2- meets goals/outcomes      Exercise & Equipment Safety: - Individual verbal instruction and demonstration of equipment use and safety with use of the equipment.      Cardiac Rehab from 01/27/2015 in [facility] Cardiac Rehab   Date  12/01/14   Educator  SB   Instruction Review Code  2- meets goals/outcomes      Infection Prevention: - Provides verbal and written material to individual with discussion of infection control including proper hand washing and proper equipment cleaning during exercise session.      Cardiac Rehab from 01/27/2015 in [facility] Cardiac Rehab   Date  12/01/14   Educator  SB   Instruction Review Code  2- meets goals/outcomes      Falls Prevention: - Provides verbal and written material to individual with discussion of falls prevention and safety.      Cardiac Rehab from 01/27/2015 in [facility] Cardiac Rehab   Date  12/01/14   Educator  SB   Instruction Review Code  2- meets goals/outcomes      Diabetes: - Individual verbal and written instruction to review signs/symptoms of diabetes, desired ranges of glucose level fasting, after meals and with exercise. Advice that pre and post exercise glucose checks will be done for 3 sessions at entry of program.    Knowledge Questionnaire Score:     Knowledge Questionnaire Score - 01/21/15 1741    Knowledge Questionnaire Score   Post Score 27      Personal Goals and Risk Factors at Admission:     Personal Goals and Risk Factors at Admission - 12/01/14 0842    Personal Goals and Risk Factors on Admission   Increase Aerobic Exercise and Physical Activity Yes;Sedentary   Intervention While in program, learn and follow the exercise prescription taught. Start at a low level workload and  increase workload after able to maintain previous level for 30 minutes. Increase time before increasing intensity.   Intervention Provide exercise education and an individualized exercise prescription that will provide continued progressive overload as per policy without signs/symptoms of physical distress.   Take Less Medication Yes   Diabetes No   Hypertension Yes   Goal Participant will see blood pressure controlled within the values of 140/52mm/Hg or within value directed by their physician.   Intervention Provide nutrition & aerobic exercise along with prescribed medications to achieve BP 140/90 or less.   Lipids Yes   Goal Cholesterol controlled with medications as prescribed, with individualized exercise RX and with personalized nutrition plan. Value goals: LDL < $Rem'70mg'vlbe$ , HDL > $Rem'40mg'gKTX$ . Participant states understanding of desired cholesterol values and following prescriptions.   Intervention Provide nutrition & aerobic exercise along with prescribed medications to achieve LDL '70mg'$ , HDL >$Remo'40mg'BGpzS$ .   Stress Yes   Goal To meet with psychosocial counselor for stress and relaxation information and guidance. To state understanding of performing relaxation techniques and or identifying personal stressors.   Intervention Provide education on types of stress, identifiying stressors, and  ways to cope with stress. Provide demonstration and active practice of relaxation techniques.      Personal Goals and Risk Factors Review:      Goals and Risk Factor Review      12/21/14 1800 12/24/14 1723 01/06/15 1822 01/18/15 1801     Increase Aerobic Exercise and Physical Activity   Goals Progress/Improvement seen  Yes Yes Yes Yes    Comments Is able to go 2.79mph on Treadmill but started having PVC's again. EP MD appt not till Nov so faxed to Dr. Rosanna Randy (primary Care Dmauri said to fax info to) his PVC and fyi that potassium, calcium etc blood work has not been drawn since he was in the hospital. Zekiel was given  info about our independent gym and the [employer].  Rare PVC on 2% incline today on the Treadmill. No c/o from [place].  Doing well, is concerned that he is not getting his heartrate up over 120.  Reviewed the fact that he is 50 plus beats over resting and that he is working an RPE between 11 and 15.  He said he will remember that as he looks at his exercise regimen. He will see all three MD's next week and will review his Heartrate, his ectopics and his medications during the visits.    Take Less Medication   Goals Progress/Improvement seen    No    Comments    No changes yet.  Does see 3 doctors next week.    Hypertension   Goal    Participant will see blood pressure controlled within the values of 140/38mm/Hg or within value directed by their physician.    Progress seen toward goals    Yes    Comments    Undray does come in occasionally with a BP diastolic above 29(VBTYO after work). His BP readings at end of session are all in good range. He does see his MD next week and will review BP readings then.    Abnormal Lipids   Goal    Cholesterol controlled with medications as prescribed, with individualized exercise RX and with personalized nutrition plan. Value goals: LDL < $Rem'70mg'NLIJ$ , HDL > $Rem'40mg'Nwhf$ . Participant states understanding of desired cholesterol values and following prescriptions.    Progress seen towards goals    Yes    Comments    Akoni is exercising and working on his nutrition to maintain cholesterol levels.    Stress   Goal    To meet with psychosocial counselor for stress and relaxation information and guidance. To state understanding of performing relaxation techniques and or identifying personal stressors.    Progress seen towards goals    Yes    Comments    Json states that he is doing well managing his stress. He does recognize what causes the stress and will avoid the stress inducing factors if possible.       Personal Goals Discharge (Final Personal Goals and Risk  Factors Review):      Goals and Risk Factor Review - 01/18/15 1801    Increase Aerobic Exercise and Physical Activity   Goals Progress/Improvement seen  Yes   Comments Doing well, is concerned that he is not getting his heartrate up over 120.  Reviewed the fact that he is 50 plus beats over resting and that he is working an RPE between 11 and 15.  He said he will remember that as he looks at his exercise regimen. He will see all three MD's next week and  will review his Heartrate, his ectopics and his medications during the visits.   Take Less Medication   Goals Progress/Improvement seen No   Comments No changes yet.  Does see 3 doctors next week.   Hypertension   Goal Participant will see blood pressure controlled within the values of 140/37m/Hg or within value directed by their physician.   Progress seen toward goals Yes   Comments SJohnydoes come in occasionally with a BP diastolic above 984(TXMIWafter work). His BP readings at end of session are all in good range. He does see his MD next week and will review BP readings then.   Abnormal Lipids   Goal Cholesterol controlled with medications as prescribed, with individualized exercise RX and with personalized nutrition plan. Value goals: LDL < 754m HDL > 4039mParticipant states understanding of desired cholesterol values and following prescriptions.   Progress seen towards goals Yes   Comments SpeMidas exercising and working on his nutrition to maintain cholesterol levels.   Stress   Goal To meet with psychosocial counselor for stress and relaxation information and guidance. To state understanding of performing relaxation techniques and or identifying personal stressors.   Progress seen towards goals Yes   Comments SpeDerricates that he is doing well managing his stress. He does recognize what causes the stress and will avoid the stress inducing factors if possible.       Comments: 30 day review SpeDurant discharged.

## 2015-02-02 NOTE — Patient Instructions (Signed)
Discharge Instructions  Patient Details  Name: Kevin Moore MRN: [id number] Date of Birth: 1950/07/06 Referring Provider:  [name], MD   Number of Visits: 36  Reason for Discharge:  Patient reached a stable level of exercise. Patient independent in their exercise.  Smoking History:  History  Smoking status  . Never Smoker   Smokeless tobacco  . Not on file    Diagnosis:  ST elevation myocardial infarction (STEMI), unspecified artery (HCC) - Plan: CARDIAC REHAB 30 DAY REVIEW  S/P PTCA (percutaneous transluminal coronary angioplasty) - Plan: CARDIAC REHAB 30 DAY REVIEW  Initial Exercise Prescription:     Initial Exercise Prescription - 12/01/14 1000    Date of Initial Exercise Prescription   Date 12/01/14   Treadmill   MPH 2.8   Grade 0   Minutes 15   Bike   Level 0.4   Minutes 10   Recumbant Bike   Level 3   RPM 45   Watts 25   Minutes 15   NuStep   Level 3   Watts 40   Minutes 15   Arm Ergometer   Level 1   Watts 8   Minutes 10   Arm/Foot Ergometer   Level 4   Watts 15   Minutes 10   Cybex   Level 1   RPM 60   Minutes 10   Recumbant Elliptical   Level 1   RPM 40   Watts 10   Minutes 10   Elliptical   Level 1   Speed 3   Minutes 1   REL-XR   Level 2   Watts 35   Minutes 15   Prescription Details   Frequency (times per week) 3   Duration Progress to 30 minutes of continuous aerobic without signs/symptoms of physical distress   Intensity   THRR REST +  30   Ratings of Perceived Exertion 11-15   Progression Continue progressive overload as per policy without signs/symptoms or physical distress.   Resistance Training   Training Prescription Yes   Weight 2   Reps 10-15      Discharge Exercise Prescription (Final Exercise Prescription Changes):     Exercise Prescription Changes - 01/25/15 1100    Exercise Review   Progression No  No increases due to symptoms   Response to Exercise   Blood Pressure (Admit) 122/78  mmHg   Blood Pressure (Exercise) 142/76 mmHg   Blood Pressure (Exit) 110/78 mmHg   Heart Rate (Admit) 84 bpm   Heart Rate (Exercise) 134 bpm   Heart Rate (Exit) 65 bpm   Rating of Perceived Exertion (Exercise) 12   Symptoms With increased effort Kevin Moore goes into bigeminy even after doing a prolonged warm up. These concerns have been sent to his MD. He presents no symptoms.   Comments No increases are being made due to consistent bouts of bigeminy. Kevin Moore is cleared to resume current workloads and no more interval training.    Duration Progress to 30 minutes of continuous aerobic without signs/symptoms of physical distress   Intensity Other (comment)  40-80% of HRR 99-149   Progression Continue progressive overload as per policy without signs/symptoms or physical distress.   Resistance Training   Training Prescription Yes   Weight 5   Reps 10-15   Interval Training   Interval Training No   Treadmill   MPH 3   Grade 2   Minutes 20   NuStep   Level 3   Watts 50  Minutes 15   Elliptical   Level 2   Speed 3   Minutes 10   REL-XR   Level 2   Watts 55   Minutes 15      Functional Capacity:     6 Minute Walk      12/01/14 1020 01/21/15 1647     6 Minute Walk   Phase Initial Discharge    Distance 1675 feet 1685 feet    Distance % Change  1 %    Walk Time 6 minutes 6 minutes    Resting HR 48 bpm 80 bpm    Resting BP 182/90 mmHg 122/78 mmHg    Max Ex. HR 84 bpm 112 bpm    Max Ex. BP 188/90 mmHg 142/76 mmHg    RPE 11 12    Perceived Dyspnea   0    Symptoms No No       Quality of Life:     Quality of Life - 02/02/15 0854    Quality of Life Scores   Health/Function Pre 29.2 %   Health/Function Post 28.7 %   Health/Function % Change -1 %   Socioeconomic Pre 28.32 %   Socioeconomic Post 28.43 %   Socioeconomic % Change 0 %   Psych/Spiritual Pre 28.93 %   Psych/Spiritual Post 29.64 %   Psych/Spiritual % Change 2 %   Family Pre 30 %   Family Post 30 %    Family % Change 0 %   GLOBAL Pre 29.06 %   GLOBAL Post 29.03 %   GLOBAL % Change 0 %      Personal Goals: Goals established at orientation with interventions provided to work toward goal.     Personal Goals and Risk Factors at Admission - 12/01/14 0842    Personal Goals and Risk Factors on Admission   Increase Aerobic Exercise and Physical Activity Yes;Sedentary   Intervention While in program, learn and follow the exercise prescription taught. Start at a low level workload and increase workload after able to maintain previous level for 30 minutes. Increase time before increasing intensity.   Intervention Provide exercise education and an individualized exercise prescription that will provide continued progressive overload as per policy without signs/symptoms of physical distress.   Take Less Medication Yes   Diabetes No   Hypertension Yes   Goal Participant will see blood pressure controlled within the values of 140/6m/Hg or within value directed by their physician.   Intervention Provide nutrition & aerobic exercise along with prescribed medications to achieve BP 140/90 or less.   Lipids Yes   Goal Cholesterol controlled with medications as prescribed, with individualized exercise RX and with personalized nutrition plan. Value goals: LDL < 748m HDL > 404mParticipant states understanding of desired cholesterol values and following prescriptions.   Intervention Provide nutrition & aerobic exercise along with prescribed medications to achieve LDL <21m30mDL >40mg65mStress Yes   Goal To meet with psychosocial counselor for stress and relaxation information and guidance. To state understanding of performing relaxation techniques and or identifying personal stressors.   Intervention Provide education on types of stress, identifiying stressors, and ways to cope with stress. Provide demonstration and active practice of relaxation techniques.       Personal Goals Discharge:     Goals  and Risk Factor Review - 01/18/15 1801    Increase Aerobic Exercise and Physical Activity   Goals Progress/Improvement seen  Yes   Comments Doing well, is concerned that he is  not getting his heartrate up over 120.  Reviewed the fact that he is 50 plus beats over resting and that he is working an RPE between 11 and 15.  He said he will remember that as he looks at his exercise regimen. He will see all three MD's next week and will review his Heartrate, his ectopics and his medications during the visits.   Take Less Medication   Goals Progress/Improvement seen No   Comments No changes yet.  Does see 3 doctors next week.   Hypertension   Goal Participant will see blood pressure controlled within the values of 140/14m/Hg or within value directed by their physician.   Progress seen toward goals Yes   Comments SEllijahdoes come in occasionally with a BP diastolic above 993(ZJIRCafter work). His BP readings at end of session are all in good range. He does see his MD next week and will review BP readings then.   Abnormal Lipids   Goal Cholesterol controlled with medications as prescribed, with individualized exercise RX and with personalized nutrition plan. Value goals: LDL < 741m HDL > 4023mParticipant states understanding of desired cholesterol values and following prescriptions.   Progress seen towards goals Yes   Comments Kevin Moore exercising and working on his nutrition to maintain cholesterol levels.   Stress   Goal To meet with psychosocial counselor for stress and relaxation information and guidance. To state understanding of performing relaxation techniques and or identifying personal stressors.   Progress seen towards goals Yes   Comments Kevin Moore that he is doing well managing his stress. He does recognize what causes the stress and will avoid the stress inducing factors if possible.      Nutrition & Weight - Outcomes:     Pre Biometrics - 12/01/14 1041    Pre Biometrics    Height 6' 1.88" (1.877 m)   Weight 213 lb 3.2 oz (96.707 kg)   BMI (Calculated) 27.5       Nutrition:     Nutrition Therapy & Goals - 12/11/14 0901    Nutrition Therapy   Diet DASH   Drug/Food Interactions Statins/Certain Fruits   Fiber 30 grams   Whole Grain Foods 3 servings   Protein 8 ounces/day   Saturated Fats 14 max. grams   Fruits and Vegetables 8 servings/day   Personal Nutrition Goals   Personal Goal #1 Continue with heart healthy eating pattern.       Nutrition Discharge:     Rate Your Plate - 11/78/93/8150175 Rate Your Plate Scores   Pre Score 70   Pre Score % 78 %   Post Score 76   Post Score % 84 %   % Change 6 %      Education Questionnaire Score:     Knowledge Questionnaire Score - 01/21/15 1741    Knowledge Questionnaire Score   Post Score 27      Goals reviewed with patient; copy given to patient.

## 2015-02-16 NOTE — Progress Notes (Signed)
Discharge Summary  Patient Details  Name: Kevin Moore MRN: [id number] Date of Birth: [birth year] Referring Provider:  [name], MD   Number of Visits: 36  Reason for Discharge:  Patient reached a stable level of exercise. Patient independent in their exercise.  Smoking History:  History  Smoking status  . Never Smoker   Smokeless tobacco  . Not on file    Diagnosis:  ST elevation myocardial infarction (STEMI), unspecified artery (HCC)  S/P PTCA (percutaneous transluminal coronary angioplasty)  ADL UCSD:   Initial Exercise Prescription:     Initial Exercise Prescription - 12/01/14 1000    Date of Initial Exercise Prescription   Date 12/01/14   Treadmill   MPH 2.8   Grade 0   Minutes 15   Bike   Level 0.4   Minutes 10   Recumbant Bike   Level 3   RPM 45   Watts 25   Minutes 15   NuStep   Level 3   Watts 40   Minutes 15   Arm Ergometer   Level 1   Watts 8   Minutes 10   Arm/Foot Ergometer   Level 4   Watts 15   Minutes 10   Cybex   Level 1   RPM 60   Minutes 10   Recumbant Elliptical   Level 1   RPM 40   Watts 10   Minutes 10   Elliptical   Level 1   Speed 3   Minutes 1   REL-XR   Level 2   Watts 35   Minutes 15   Prescription Details   Frequency (times per week) 3   Duration Progress to 30 minutes of continuous aerobic without signs/symptoms of physical distress   Intensity   THRR REST +  30   Ratings of Perceived Exertion 11-15   Progression Continue progressive overload as per policy without signs/symptoms or physical distress.   Resistance Training   Training Prescription Yes   Weight 2   Reps 10-15      Discharge Exercise Prescription (Final Exercise Prescription Changes):     Exercise Prescription Changes - 01/25/15 1100    Exercise Review   Progression No  No increases due to symptoms   Response to Exercise   Blood Pressure (Admit) 122/78 mmHg   Blood Pressure (Exercise) 142/76 mmHg   Blood Pressure  (Exit) 110/78 mmHg   Heart Rate (Admit) 84 bpm   Heart Rate (Exercise) 134 bpm   Heart Rate (Exit) 65 bpm   Rating of Perceived Exertion (Exercise) 12   Symptoms With increased effort Adonte goes into bigeminy even after doing a prolonged warm up. These concerns have been sent to his MD. He presents no symptoms.   Comments No increases are being made due to consistent bouts of bigeminy. Tadeusz is cleared to resume current workloads and no more interval training.    Duration Progress to 30 minutes of continuous aerobic without signs/symptoms of physical distress   Intensity Other (comment)  40-80% of HRR 99-149   Progression Continue progressive overload as per policy without signs/symptoms or physical distress.   Resistance Training   Training Prescription Yes   Weight 5   Reps 10-15   Interval Training   Interval Training No   Treadmill   MPH 3   Grade 2   Minutes 20   NuStep   Level 3   Watts 50   Minutes 15   Elliptical   Level 2  Speed 3   Minutes 10   REL-XR   Level 2   Watts 55   Minutes 15      Functional Capacity:     6 Minute Walk      12/01/14 1020 01/21/15 1647     6 Minute Walk   Phase Initial Discharge    Distance 1675 feet 1685 feet    Distance % Change  1 %    Walk Time 6 minutes 6 minutes    Resting HR 48 bpm 80 bpm    Resting BP 182/90 mmHg 122/78 mmHg    Max Ex. HR 84 bpm 112 bpm    Max Ex. BP 188/90 mmHg 142/76 mmHg    RPE 11 12    Perceived Dyspnea   0    Symptoms No No       Psychological, QOL, Others - Outcomes: PHQ 2/9: Depression screen [facility] 2/9 01/21/2015 12/01/2014  Decreased Interest 0 0  Down, Depressed, Hopeless 0 0  PHQ - 2 Score 0 0  Altered sleeping 0 0  Tired, decreased energy 0 0  Change in appetite 0 0  Feeling bad or failure about yourself  0 0  Trouble concentrating 0 0  Moving slowly or fidgety/restless 0 0  Suicidal thoughts 0 0  PHQ-9 Score 0 0    Quality of Life:     Quality of Life - 02/02/15 0854     Quality of Life Scores   Health/Function Pre 29.2 %   Health/Function Post 28.7 %   Health/Function % Change -1 %   Socioeconomic Pre 28.32 %   Socioeconomic Post 28.43 %   Socioeconomic % Change 0 %   Psych/Spiritual Pre 28.93 %   Psych/Spiritual Post 29.64 %   Psych/Spiritual % Change 2 %   Family Pre 30 %   Family Post 30 %   Family % Change 0 %   GLOBAL Pre 29.06 %   GLOBAL Post 29.03 %   GLOBAL % Change 0 %      Personal Goals: Goals established at orientation with interventions provided to work toward goal.     Personal Goals and Risk Factors at Admission - 12/01/14 0842    Personal Goals and Risk Factors on Admission   Increase Aerobic Exercise and Physical Activity Yes;Sedentary   Intervention While in program, learn and follow the exercise prescription taught. Start at a low level workload and increase workload after able to maintain previous level for 30 minutes. Increase time before increasing intensity.   Intervention Provide exercise education and an individualized exercise prescription that will provide continued progressive overload as per policy without signs/symptoms of physical distress.   Take Less Medication Yes   Diabetes No   Hypertension Yes   Goal Participant will see blood pressure controlled within the values of 140/13m/Hg or within value directed by their physician.   Intervention Provide nutrition & aerobic exercise along with prescribed medications to achieve BP 140/90 or less.   Lipids Yes   Goal Cholesterol controlled with medications as prescribed, with individualized exercise RX and with personalized nutrition plan. Value goals: LDL < 790m HDL > 4021mParticipant states understanding of desired cholesterol values and following prescriptions.   Intervention Provide nutrition & aerobic exercise along with prescribed medications to achieve LDL <78m63mDL >40mg50mStress Yes   Goal To meet with psychosocial counselor for stress and relaxation  information and guidance. To state understanding of performing relaxation techniques and or identifying personal stressors.  Intervention Provide education on types of stress, identifiying stressors, and ways to cope with stress. Provide demonstration and active practice of relaxation techniques.       Personal Goals Discharge:     Goals and Risk Factor Review      12/21/14 1800 12/24/14 1723 01/06/15 1822 01/18/15 1801     Increase Aerobic Exercise and Physical Activity   Goals Progress/Improvement seen  Yes Yes Yes Yes    Comments Is able to go 2.67mh on Treadmill but started having PVC's again. EP MD appt not till Nov so faxed to Dr. GRosanna Randy(primary Care SRobertsonsaid to fax info to) his PVC and fyi that potassium, calcium etc blood work has not been drawn since he was in the hospital. SDonyewas given info about our independent gym and the [employer]  Rare PVC on 2% incline today on the Treadmill. [place]  Doing well, is concerned that he is not getting his heartrate up over 120.  Reviewed the fact that he is 50 plus beats over resting and that he is working an RPE between 11 and 15.  He said he will remember that as he looks at his exercise regimen. He will see all three MD's next week and will review his Heartrate, his ectopics and his medications during the visits.    Take Less Medication   Goals Progress/Improvement seen    No    Comments    No changes yet.  Does see 3 doctors next week.    Hypertension   Goal    Participant will see blood pressure controlled within the values of 140/921mHg or within value directed by their physician.    Progress seen toward goals    Yes    Comments    SpGraydonoes come in occasionally with a BP diastolic above 9044(BEEFEfter work). His BP readings at end of session are all in good range. He does see his MD next week and will review BP readings then.    Abnormal Lipids   Goal    Cholesterol controlled with medications as  prescribed, with individualized exercise RX and with personalized nutrition plan. Value goals: LDL < 7069mHDL > 37m44marticipant states understanding of desired cholesterol values and following prescriptions.    Progress seen towards goals    Yes    Comments    SpenRyunexercising and working on his nutrition to maintain cholesterol levels.    Stress   Goal    To meet with psychosocial counselor for stress and relaxation information and guidance. To state understanding of performing relaxation techniques and or identifying personal stressors.    Progress seen towards goals    Yes    Comments    SpenElyates that he is doing well managing his stress. He does recognize what causes the stress and will avoid the stress inducing factors if possible.       Nutrition & Weight - Outcomes:     Pre Biometrics - 12/01/14 1041    Pre Biometrics   Height 6' 1.88" (1.877 m)   Weight 213 lb 3.2 oz (96.707 kg)   BMI (Calculated) 27.5       Nutrition:     Nutrition Therapy & Goals - 12/11/14 0901    Nutrition Therapy   Diet DASH   Drug/Food Interactions Statins/Certain Fruits   Fiber 30 grams   Whole Grain Foods 3 servings   Protein 8 ounces/day   Saturated Fats 14 max.  grams   Fruits and Vegetables 8 servings/day   Personal Nutrition Goals   Personal Goal #1 Continue with heart healthy eating pattern.       Nutrition Discharge:     Rate Your Plate - 12/14/78 2217    Rate Your Plate Scores   Pre Score 70   Pre Score % 78 %   Post Score 76   Post Score % 84 %   % Change 6 %      Education Questionnaire Score:     Knowledge Questionnaire Score - 01/21/15 1741    Knowledge Questionnaire Score   Post Score 27

## 2015-02-25 ENCOUNTER — Encounter: Payer: Self-pay | Admitting: Family Medicine

## 2015-02-25 ENCOUNTER — Ambulatory Visit (INDEPENDENT_AMBULATORY_CARE_PROVIDER_SITE_OTHER): Payer: BLUE CROSS/BLUE SHIELD | Admitting: Family Medicine

## 2015-02-25 VITALS — BP 122/78 | HR 56 | Temp 97.8°F | Resp 16 | Wt 207.0 lb

## 2015-02-25 DIAGNOSIS — I10 Essential (primary) hypertension: Secondary | ICD-10-CM

## 2015-02-25 DIAGNOSIS — M791 Myalgia, unspecified site: Secondary | ICD-10-CM

## 2015-02-25 DIAGNOSIS — I472 Ventricular tachycardia, unspecified: Secondary | ICD-10-CM

## 2015-02-25 DIAGNOSIS — E7800 Pure hypercholesterolemia, unspecified: Secondary | ICD-10-CM

## 2015-02-25 DIAGNOSIS — I213 ST elevation (STEMI) myocardial infarction of unspecified site: Secondary | ICD-10-CM | POA: Diagnosis not present

## 2015-02-25 DIAGNOSIS — Z23 Encounter for immunization: Secondary | ICD-10-CM | POA: Diagnosis not present

## 2015-02-25 DIAGNOSIS — I2510 Atherosclerotic heart disease of native coronary artery without angina pectoris: Secondary | ICD-10-CM | POA: Diagnosis not present

## 2015-02-25 NOTE — Progress Notes (Signed)
Patient ID: Kevin Moore, male   DOB: 1950-11-14, 64 y.o.   MRN: [id number]    Subjective:  HPI  Pt is here for a follow up for HTN. He has recently had a MI. He reports that he is feeling well. He was at a concert in [place] and started getting extremely hot and felt weird and felt like he was going to pass out. He was sent to [place] where 3 different stents were placed. He has been through cardiac rehab.  Prior to Admission medications   Medication Sig Start Date End Date Taking? Authorizing Provider  Aspirin (ASPIR-81 PO) Take by mouth. STUDY [id number] aspirin/placebo 81 MG tablet 01/27/15  Yes Historical Provider, MD  atorvastatin (LIPITOR) 80 MG tablet Take 80 mg by mouth. 10/22/14 10/22/15 Yes Historical Provider, MD  losartan (COZAAR) 100 MG tablet Take 1 tablet (100 mg total) by mouth daily. 08/26/14  Yes Chaddrick Brue Maceo Pro., MD  magnesium oxide (MAG-OX) 400 MG tablet Take 400 mg by mouth. 01/26/15 01/26/16 Yes Historical Provider, MD  omeprazole (PRILOSEC) 20 MG capsule Take by mouth. 07/26/14  Yes Historical Provider, MD  ticagrelor (BRILINTA) 90 MG TABS tablet Take 90 mg by mouth. 10/22/14  Yes Historical Provider, MD    Patient Active Problem List   Diagnosis Date Noted  . CAD in native artery 01/26/2015  . Beat, premature ventricular 01/26/2015  . Ventricular tachycardia ([place]) 01/26/2015  . Heart attack ([place]) 10/16/2014  . Myocardial infarction ([place]) 10/16/2014  . Coronary artery disease 08/25/2014  . Essential (primary) hypertension 08/25/2014  . Acid reflux 08/25/2014  . HLD (hyperlipidemia) 08/25/2014    History reviewed. No pertinent past medical history.  Social History   Social History  . Marital Status: Unknown    Spouse Name: N/A  . Number of Children: N/A  . Years of Education: N/A   Occupational History  . Not on file.   Social History Main Topics  . Smoking status: Never Smoker   . Smokeless tobacco: Not on file  . Alcohol Use: 1.8 oz/week   3 Cans of beer per week  . Drug Use: No  . Sexual Activity: Yes   Other Topics Concern  . Not on file   Social History Narrative    No Known Allergies  Review of Systems  Constitutional: Negative.   HENT: Negative.   Eyes: Negative.   Respiratory: Negative.   Cardiovascular: Negative.   Gastrointestinal: Negative.   Genitourinary: Negative.   Musculoskeletal: Negative.   Skin: Negative.   Neurological: Negative.   Endo/Heme/Allergies: Negative.   Psychiatric/Behavioral: Negative.     Immunization History  Administered Date(s) Administered  . Tdap 07/25/2011  . Zoster 08/21/2012   Objective:  BP 122/78 mmHg  Pulse 56  Temp(Src) 97.8 F (36.6 C) (Oral)  Resp 16  Wt 207 lb (93.895 kg)  SpO2 97%  Physical Exam  Constitutional: He is oriented to person, place, and time and well-developed, well-nourished, and in no distress.  HENT:  Head: Normocephalic and atraumatic.  Right Ear: External ear normal.  Left Ear: External ear normal.  Nose: Nose normal.  Eyes: Conjunctivae and EOM are normal. Pupils are equal, round, and reactive to light.  Neck: Normal range of motion. Neck supple.  Cardiovascular: Normal rate, regular rhythm, normal heart sounds and intact distal pulses.   Pulmonary/Chest: Effort normal and breath sounds normal.  Abdominal: Soft. Bowel sounds are normal.  Musculoskeletal: Normal range of motion.  Neurological: He is alert and oriented to person,  place, and time. Gait normal. GCS score is 15.  Skin: Skin is warm and dry.  Psychiatric: Mood, memory, affect and judgment normal.    Lab Results  Component Value Date   WBC 5.5 06/11/2014   HGB 15.4 06/11/2014   HCT 46 06/11/2014   PLT 215 06/11/2014   GLUCOSE 95 09/04/2013   CHOL 232* 06/11/2014   TRIG 94 06/11/2014   HDL 46 06/11/2014   LDLCALC 167 06/11/2014   TSH 1.59 06/11/2014   PSA 2.9 06/11/2014    CMP     Component Value Date/Time   NA 146 06/11/2014   NA 141 09/04/2013 1037     K 4.6 06/11/2014   K 3.9 09/04/2013 1037   CL 107 09/04/2013 1037   CO2 29 09/04/2013 1037   GLUCOSE 95 09/04/2013 1037   BUN 18 06/11/2014   BUN 15 09/04/2013 1037   CREATININE 1.0 06/11/2014   CREATININE 0.99 09/04/2013 1037   CALCIUM 9.3 09/04/2013 1037   AST 13* 06/11/2014   ALT 22 06/11/2014   ALKPHOS 98 06/11/2014   GFRNONAA >60 09/04/2013 1037   GFRAA >60 09/04/2013 1037    Assessment and Plan :   1. Myalgia Improved with Magnesium and Co-Q010 and lower Lipitor dose. - CK - Comprehensive metabolic panel - Lipid Panel With LDL/HDL Ratio  2. Elevated cholesterol  - Comprehensive metabolic panel - Lipid Panel With LDL/HDL Ratio  3. Essential (primary) hypertension  - TSH  4. ST elevation myocardial infarction (STEMI), unspecified artery ([place])   5. Ventricular tachycardia ([place])   6. CAD in native artery--s/p  Drug eluting stent  Time s3. EF 66%. 7.Arrhythmia Per EP in [place].Consider referral to Dr Jolyn Nap in future.  Patient was seen and examined by Dr. Miguel Aschoff, and noted scribed by Webb Laws, Opal MD [facility] 02/25/2015 3:29 PM

## 2015-02-27 LAB — LIPID PANEL WITH LDL/HDL RATIO
Cholesterol, Total: 131 mg/dL (ref 100–199)
HDL: 43 mg/dL
LDL Calculated: 73 mg/dL (ref 0–99)
LDl/HDL Ratio: 1.7 ratio (ref 0.0–3.6)
Triglycerides: 77 mg/dL (ref 0–149)
VLDL Cholesterol Cal: 15 mg/dL (ref 5–40)

## 2015-02-27 LAB — COMPREHENSIVE METABOLIC PANEL WITH GFR
ALT: 30 [IU]/L (ref 0–44)
AST: 23 [IU]/L (ref 0–40)
Albumin/Globulin Ratio: 1.7 (ref 1.1–2.5)
Albumin: 4.2 g/dL (ref 3.6–4.8)
Alkaline Phosphatase: 128 [IU]/L — ABNORMAL HIGH (ref 39–117)
BUN/Creatinine Ratio: 15 (ref 10–22)
BUN: 14 mg/dL (ref 8–27)
Bilirubin Total: 0.6 mg/dL (ref 0.0–1.2)
CO2: 25 mmol/L (ref 18–29)
Calcium: 9.3 mg/dL (ref 8.6–10.2)
Chloride: 104 mmol/L (ref 97–106)
Creatinine, Ser: 0.95 mg/dL (ref 0.76–1.27)
GFR calc Af Amer: 99 mL/min/{1.73_m2}
GFR calc non Af Amer: 85 mL/min/{1.73_m2}
Globulin, Total: 2.5 g/dL (ref 1.5–4.5)
Glucose: 104 mg/dL — ABNORMAL HIGH (ref 65–99)
Potassium: 4.9 mmol/L (ref 3.5–5.2)
Sodium: 142 mmol/L (ref 136–144)
Total Protein: 6.7 g/dL (ref 6.0–8.5)

## 2015-02-27 LAB — CK: Total CK: 75 U/L (ref 24–204)

## 2015-02-27 LAB — TSH: TSH: 1.31 u[IU]/mL (ref 0.450–4.500)

## 2015-07-15 ENCOUNTER — Encounter: Payer: BLUE CROSS/BLUE SHIELD | Admitting: Family Medicine

## 2015-08-27 ENCOUNTER — Other Ambulatory Visit: Payer: Self-pay | Admitting: Family Medicine

## 2015-09-21 ENCOUNTER — Other Ambulatory Visit: Payer: Self-pay | Admitting: Family Medicine

## 2016-02-10 ENCOUNTER — Other Ambulatory Visit: Payer: Self-pay

## 2016-02-10 MED ORDER — TICAGRELOR 90 MG PO TABS: 90.0000 mg | ORAL_TABLET | Freq: Two times a day (BID) | ORAL | 3 refills | Status: DC

## 2016-02-10 NOTE — Telephone Encounter (Signed)
Refill request from [facility] for Brilinta 90 mg. LOV was in December 2016, next appointment scheduled for 03/06/16.

## 2016-03-06 ENCOUNTER — Encounter: Payer: Self-pay | Admitting: Cardiovascular Disease

## 2016-03-06 ENCOUNTER — Ambulatory Visit (INDEPENDENT_AMBULATORY_CARE_PROVIDER_SITE_OTHER): Payer: BLUE CROSS/BLUE SHIELD | Admitting: Cardiovascular Disease

## 2016-03-06 VITALS — BP 144/106 | HR 61 | Ht 74.0 in | Wt 215.2 lb

## 2016-03-06 DIAGNOSIS — I10 Essential (primary) hypertension: Secondary | ICD-10-CM

## 2016-03-06 DIAGNOSIS — Z955 Presence of coronary angioplasty implant and graft: Secondary | ICD-10-CM | POA: Insufficient documentation

## 2016-03-06 DIAGNOSIS — Z136 Encounter for screening for cardiovascular disorders: Secondary | ICD-10-CM

## 2016-03-06 DIAGNOSIS — I472 Ventricular tachycardia, unspecified: Secondary | ICD-10-CM

## 2016-03-06 DIAGNOSIS — E782 Mixed hyperlipidemia: Secondary | ICD-10-CM | POA: Diagnosis not present

## 2016-03-06 DIAGNOSIS — I2510 Atherosclerotic heart disease of native coronary artery without angina pectoris: Secondary | ICD-10-CM

## 2016-03-06 MED ORDER — TICAGRELOR 60 MG PO TABS: 60.0000 mg | ORAL_TABLET | Freq: Two times a day (BID) | ORAL | 3 refills | Status: DC

## 2016-03-06 NOTE — Progress Notes (Signed)
Cardiology Office Note  Date:  03/06/2016   ID:  Kevin Moore, DOB 1950-06-21, MRN [id number]  PCP:  Wilhemena Durie, MD   Chief Complaint  Patient presents with  . other    New Patient. Self Referral/Dr Efrain Sella at [facility] like to come here closer to home. Pt c/o aching and believes it may be due to lipitor.  Reviewed meds with pt verbally.    HPI:  Kevin Moore is a pleasant 65 year old gentleman with history of hyperlipidemia, CAD, S/P PCI of left circumflex and RCA, HTN, Dyslipidemia, VF arrest with polymorphic VT thought secondary to RCA occlusion , who presents by self-referral to the [facility] office for his underlying coronary artery disease   He was admitted on 10/16/14 as a STEMI , presented to [facility] and experienced Vfib prior to opening his mid Cx occlusion which was successfully stented. He had moderate to severe RCA disease   He  developed polymorphic VT thought secondary to the RCA  Previously unable to tolerate beta blocker as he develops profound bradycardia.  EP was consulted. They recommended Lidocaine drip and stenting x2 of the RCA. once he was stented, he had no further ectopy. He was monitored for another 48 hours without any further events. He was discharged with a 30 day event monitor that did not show any significant arrhythmia by report.   discharged home on ASA and Brilinta  Had MRI that showed scar from prior MI likely causing PVC's, EF 66%, Recommendation was made for no AICD.  He has completed cardiac rehabilitation   denies chest pain, dyspnea, dizziness, edema, palpitations, or syncope/presyncope.  No regular exercise program Denies any symptoms concerning for arrhythmia  Blood pressure elevated in the office, feels that his anxiety Bp at home,  120/70,  Reports that the brilinta  is expensive , Costing him $200 Was part of a trial for period Of time, trial has since ended   now paying for his own medications    did not tolerate  Lipitor 80 mg daily, felt it affected his memory , decreased dose down to 40, continues to have myalgias    EKG on today's visit shows normal sinus rhythm with rate 61 bpm, no significant ST or T-wave changes   PMH:   has no past medical history on file.  PSH:    Past Surgical History:  Procedure Laterality Date  . COLON SURGERY     partial removal of colon  . KNEE ARTHROSCOPY Left   . TONSILLECTOMY      Current Outpatient Prescriptions  Medication Sig Dispense Refill  . Aspirin (ASPIR-81 PO) Take by mouth. STUDY [id number] aspirin/placebo 81 MG tablet    . Coenzyme Q10 (CO Q 10 PO) Take by mouth.    . losartan (COZAAR) 100 MG tablet Take 1 tablet (100 mg total) by mouth daily. 90 tablet 3  . omeprazole (PRILOSEC) 20 MG capsule TAKE ONE CAPSULE BY MOUTH DAILY 30 capsule 12  . ticagrelor (BRILINTA) 60 MG TABS tablet Take 1 tablet (60 mg total) by mouth 2 (two) times daily. 180 tablet 3  . atorvastatin (LIPITOR) 80 MG tablet Take 40 mg by mouth.     No current facility-administered medications for this visit.      Allergies:   Patient has no known allergies.   Social History:  The patient  reports that he has never smoked. He has never used smokeless tobacco. He reports that he drinks about 1.8 oz of alcohol per week . He  reports that he does not use drugs.   Family History:   family history includes Cancer in his mother; Epilepsy in his son; Heart disease in his father; Hyperlipidemia in his mother.    Review of Systems: Review of Systems  Constitutional: Negative.   Respiratory: Negative.   Cardiovascular: Negative.   Gastrointestinal: Negative.   Musculoskeletal: Negative.   Neurological: Negative.   Psychiatric/Behavioral: Negative.   All other systems reviewed and are negative.    PHYSICAL EXAM: VS:  BP (!) 144/106 (BP Location: Right Arm, Patient Position: Sitting, Cuff Size: Normal)   Pulse 61   Ht 6\' 2"  (1.88 m)   Wt 215 lb 4 oz (97.6 kg)   BMI 27.64 kg/m   , BMI Body mass index is 27.64 kg/m. GEN: Well nourished, well developed, in no acute distress  HEENT: normal  Neck: no JVD, carotid bruits, or masses Cardiac: RRR; no murmurs, rubs, or gallops,no edema  Respiratory:  clear to auscultation bilaterally, normal work of breathing GI: soft, nontender, nondistended, + BS MS: no deformity or atrophy  Skin: warm and dry, no rash Neuro:  Strength and sensation are intact Psych: euthymic mood, full affect    Recent Labs: No results found for requested labs within last 8760 hours.    Lipid Panel Lab Results  Component Value Date   CHOL 131 02/26/2015   HDL 43 02/26/2015   LDLCALC 73 02/26/2015   TRIG 77 02/26/2015      Wt Readings from Last 3 Encounters:  03/06/16 215 lb 4 oz (97.6 kg)  02/25/15 207 lb (93.9 kg)  12/01/14 213 lb 3.2 oz (96.7 kg)       ASSESSMENT AND PLAN:  Screening for cardiovascular condition - Plan: EKG 12-Lead  Coronary artery disease involving native coronary artery of native heart without angina pectoris Long discussion concerning recent cardiac events dating back to July 2016 New prescription provided for Brilinta 60 mill grams twice a day with coupon. If still expensive, would consider changing to Plavix   currently denies any anginal symptoms. No testing at this time  Mixed hyperlipidemia  recommended repeat lipid panel with primary care  Goal LDL less than 70, discussed with him  Potentially could add Zetia to meet goal   Essential (primary) hypertension  elevated blood pressure in the office today even on recheck . Wife is a Marine scientist, reports blood pressure well controlled at home . Recommended she continue to monitor numbers closely, call our office if blood pressure runs high   Ventricular tachycardia ([place])  prior history of V. fib arrest prior to stent placement left circumflex and RCA  Felt not to be a ICD candidate based on EP workup at [facility] 2016   History of coronary artery stent  placement Stent placed to RCA 2, left circumflex 1   medication changes as above   Total encounter time more than 45 minutes  Greater than 50% was spent in counseling and coordination of care with the patient   Disposition:   F/U  6 months   Orders Placed This Encounter  Procedures  . EKG 12-Lead     Signed, Esmond Plants, M.D., Ph.D. 03/06/2016  [place], [place]

## 2016-03-06 NOTE — Patient Instructions (Addendum)
Medication Instructions:   Please decrease the brilinta 60 mg twice a day   Monitor blood pressure at home Call if elevated Systolic [id number] Bottom 0000000  Labwork:  Ask Rosanna Randy about liver and lipids, free testosterone, TSH   Goal total chol <150, LDL <70   Testing/Procedures:  No further testing at this time   I recommend watching educational videos on topics of interest to you at:       www.goemmi.com  Enter code: HEARTCARE    Follow-Up: It was a pleasure seeing you in the office today. Please call us if you have new issues that need to be addressed before your next appt.  [phone]  Your physician wants you to follow-up in: 6 months.  You will receive a reminder letter in the mail two months in advance. If you don't receive a letter, please call our office to schedule the follow-up appointment.  If you need a refill on your cardiac medications before your next appointment, please call your pharmacy.

## 2016-03-08 ENCOUNTER — Telehealth: Payer: Self-pay | Admitting: Family Medicine

## 2016-03-08 NOTE — Telephone Encounter (Signed)
Please review and advise. KW 

## 2016-03-08 NOTE — Telephone Encounter (Signed)
Pt seen Dr. Rockey Situ and he ask that Dr. Rosanna Randy order a full blood panel , thyroid and PSA.  Pt ask to call him back when the order was ready.  Pt's call back is [phone]  [facility]

## 2016-03-09 NOTE — Telephone Encounter (Signed)
I need to see him to order appropriate labs. It is been a year since Kevin Moore seen him.

## 2016-03-09 NOTE — Telephone Encounter (Signed)
Patient has scheduled a physical for 04/12/16.KW

## 2016-04-12 ENCOUNTER — Encounter: Payer: Self-pay | Admitting: Family Medicine

## 2016-04-18 ENCOUNTER — Encounter: Payer: Self-pay | Admitting: Cardiovascular Disease

## 2016-04-24 ENCOUNTER — Other Ambulatory Visit: Payer: Self-pay | Admitting: *Deleted

## 2016-04-24 MED ORDER — CLOPIDOGREL BISULFATE 75 MG PO TABS: 75.0000 mg | ORAL_TABLET | Freq: Every day | ORAL | 6 refills | Status: DC

## 2016-05-10 ENCOUNTER — Ambulatory Visit (INDEPENDENT_AMBULATORY_CARE_PROVIDER_SITE_OTHER): Payer: BLUE CROSS/BLUE SHIELD | Admitting: Family Medicine

## 2016-05-10 ENCOUNTER — Encounter: Payer: Self-pay | Admitting: Family Medicine

## 2016-05-10 VITALS — BP 136/82 | HR 66 | Temp 98.2°F | Resp 14 | Ht 73.5 in | Wt 223.0 lb

## 2016-05-10 DIAGNOSIS — I10 Essential (primary) hypertension: Secondary | ICD-10-CM

## 2016-05-10 DIAGNOSIS — Z125 Encounter for screening for malignant neoplasm of prostate: Secondary | ICD-10-CM

## 2016-05-10 DIAGNOSIS — K219 Gastro-esophageal reflux disease without esophagitis: Secondary | ICD-10-CM

## 2016-05-10 DIAGNOSIS — Z1211 Encounter for screening for malignant neoplasm of colon: Secondary | ICD-10-CM | POA: Diagnosis not present

## 2016-05-10 DIAGNOSIS — I2510 Atherosclerotic heart disease of native coronary artery without angina pectoris: Secondary | ICD-10-CM

## 2016-05-10 DIAGNOSIS — Z0000 Encounter for general adult medical examination without abnormal findings: Secondary | ICD-10-CM | POA: Diagnosis not present

## 2016-05-10 DIAGNOSIS — E782 Mixed hyperlipidemia: Secondary | ICD-10-CM | POA: Diagnosis not present

## 2016-05-10 DIAGNOSIS — M2550 Pain in unspecified joint: Secondary | ICD-10-CM

## 2016-05-10 DIAGNOSIS — R739 Hyperglycemia, unspecified: Secondary | ICD-10-CM

## 2016-05-10 LAB — POCT URINALYSIS DIPSTICK
Bilirubin, UA: NEGATIVE
Blood, UA: NEGATIVE
Glucose, UA: NEGATIVE
KETONES UA: NEGATIVE
Leukocytes, UA: NEGATIVE
Nitrite, UA: NEGATIVE
PROTEIN UA: NEGATIVE
SPEC GRAV UA: 1.015
Urobilinogen, UA: 0.2
pH, UA: 6

## 2016-05-10 LAB — IFOBT (OCCULT BLOOD): IFOBT: NEGATIVE

## 2016-05-10 MED ORDER — CLOPIDOGREL BISULFATE 75 MG PO TABS: 75.0000 mg | ORAL_TABLET | Freq: Every day | ORAL | 3 refills | Status: DC

## 2016-05-10 MED ORDER — LOSARTAN POTASSIUM 100 MG PO TABS: 100.0000 mg | ORAL_TABLET | Freq: Every day | ORAL | 3 refills | Status: DC

## 2016-05-10 MED ORDER — ATORVASTATIN CALCIUM 40 MG PO TABS: 40.0000 mg | ORAL_TABLET | Freq: Every day | ORAL | 3 refills | Status: DC

## 2016-05-10 MED ORDER — OMEPRAZOLE 20 MG PO CPDR: 20.0000 mg | DELAYED_RELEASE_CAPSULE | Freq: Every day | ORAL | 3 refills | Status: DC

## 2016-05-10 NOTE — Progress Notes (Signed)
Patient: Kevin Moore, Male    DOB: [birth year], 66 y.o.   MRN: [id number] Visit Date: 05/10/2016  Today's Provider: Wilhemena Durie, MD   Chief Complaint  Patient presents with  . Annual Exam   Subjective:  Kevin Moore is a 66 y.o. male who presents today for health maintenance and complete physical. He feels fairly well. He reports exercising not at this time. He reports he is sleeping well. Immunization History  Administered Date(s) Administered  . Influenza,inj,Quad PF,36+ Mos 02/25/2015  . Tdap 07/25/2011  . Zoster 08/21/2012   Last colonoscopy 09/11/11 active colitis. Patient had colon surgery after that and was told to repeat colonoscopy in 10 years. Review of Systems  Constitutional: Negative.   HENT: Negative.   Eyes: Negative.   Respiratory: Negative.   Cardiovascular: Negative.   Gastrointestinal: Negative.   Endocrine: Negative.   Genitourinary: Negative.   Musculoskeletal: Positive for arthralgias.  Skin: Negative.   Allergic/Immunologic: Negative.   Neurological: Negative.   Hematological: Negative.   Psychiatric/Behavioral: Negative.     Social History   Social History  . Marital status: Unknown    Spouse name: N/A  . Number of children: N/A  . Years of education: N/A   Occupational History  . Not on file.   Social History Main Topics  . Smoking status: Never Smoker  . Smokeless tobacco: Never Used  . Alcohol use 1.8 oz/week    3 Cans of beer per week  . Drug use: No  . Sexual activity: Yes   Other Topics Concern  . Not on file   Social History Narrative  . No narrative on file    Patient Active Problem List   Diagnosis Date Noted  . History of coronary artery stent placement 03/06/2016  . Beat, premature ventricular 01/26/2015  . Ventricular tachycardia ([place]) 01/26/2015  . Heart attack 10/16/2014  . Myocardial infarction 10/16/2014  . Coronary artery disease 08/25/2014  . Essential (primary) hypertension 08/25/2014  .  Acid reflux 08/25/2014  . HLD (hyperlipidemia) 08/25/2014    Past Surgical History:  Procedure Laterality Date  . COLON SURGERY     partial removal of colon  . KNEE ARTHROSCOPY Left   . TONSILLECTOMY      His family history includes Cancer in his mother; Epilepsy in his son; Heart disease in his father; Hyperlipidemia in his mother.     Outpatient Encounter Prescriptions as of 05/10/2016  Medication Sig Note  . Aspirin (ASPIR-81 PO) Take by mouth. STUDY [id number] aspirin/placebo 81 MG tablet 02/25/2015: Received from: [facility]: Take by mouth.  . clopidogrel (PLAVIX) 75 MG tablet Take 1 tablet (75 mg total) by mouth daily.   . Coenzyme Q10 (CO Q 10 PO) Take by mouth.   . losartan (COZAAR) 100 MG tablet Take 1 tablet (100 mg total) by mouth daily.   . Magnesium 400 MG CAPS Take 1 capsule by mouth daily.   Marland Kitchen omeprazole (PRILOSEC) 20 MG capsule TAKE ONE CAPSULE BY MOUTH DAILY   . atorvastatin (LIPITOR) 80 MG tablet Take 40 mg by mouth. 12/01/2014: Received from: [facility]   No facility-administered encounter medications on file as of 05/10/2016.     Patient Care Team: Jerrol Banana., MD as PCP - General (Family Medicine) Minna Merritts, MD as Consulting Physician (Cardiology)      Objective:   Vitals:  Vitals:   05/10/16 0906  BP: 136/82  Pulse: 66  Resp: 14  Temp: 98.2  F (36.8 C)  Weight: 223 lb (101.2 kg)  Height: 6' 1.5" (1.867 m)    Physical Exam  Constitutional: He is oriented to person, place, and time. He appears well-developed and well-nourished.  HENT:  Head: Normocephalic and atraumatic.  Right Ear: External ear normal.  Left Ear: External ear normal.  Nose: Nose normal.  Mouth/Throat: Oropharynx is clear and moist.  Right submandibular salivary gland slightly enlarged but with normal texture.  Eyes: Conjunctivae are normal. Pupils are equal, round, and reactive to light.  Neck: Normal range of motion. Neck supple.   Cardiovascular: Normal rate, regular rhythm, normal heart sounds and intact distal pulses.   No murmur heard. Pulmonary/Chest: Effort normal and breath sounds normal. No respiratory distress. He has no wheezes.  Abdominal: Soft. Bowel sounds are normal. He exhibits no distension.  Genitourinary: Rectum normal, prostate normal and penis normal. Rectal exam shows guaiac negative stool. No penile tenderness.  Musculoskeletal: He exhibits no edema or tenderness.  Neurological: He is alert and oriented to person, place, and time.  Skin: Skin is warm. No rash noted. No erythema.  Psychiatric: He has a normal mood and affect. His behavior is normal. Judgment and thought content normal.     Depression Screen PHQ 2/9 Scores 01/21/2015 12/01/2014  PHQ - 2 Score 0 0  PHQ- 9 Score 0 0    Assessment & Plan:   1. Annual physical exam - CBC w/Diff/Platelet - Comprehensive metabolic panel - Lipid Panel With LDL/HDL Ratio - TSH - POCT Urinalysis Dipstick  2. Prostate cancer screening - PSA  3. Colon cancer screening - IFOBT POC (occult bld, rslt in office)  4. Essential (primary) hypertension  5. Coronary artery disease involving native coronary artery of native heart without angina pectoris Follows cardiologist  6. Mixed hyperlipidemia - Comprehensive metabolic panel - Lipid Panel With LDL/HDL Ratio  7. Gastroesophageal reflux disease, esophagitis presence not specified  8. Arthralgia, unspecified joint Will check labs for possible causes. Advised patient to stop Lipitor for 1 month if does not notice any improvement to re start this medication but at 40 mg instead of 80 mg. Follow as needed. Patient has appointment with cardiologist coming up to discuss this also. If she feels better off of lipitor consider Crestor 20 or 40mg . - Testosterone - CK (Creatine Kinase)  9. Hyperglycemia - HgB A1c  HPI, Exam and A&P transcribed under direction and in the presence of Miguel Aschoff,  MD.   I have done the exam and reviewed the above chart and it is accurate to the best of my knowledge. Development worker, community has been used in this note in any air is in the dictation or transcription are unintentional.  Discussed health benefits of physical activity, and encouraged him to engage in regular exercise appropriate for his age and condition.

## 2016-05-11 ENCOUNTER — Encounter: Payer: Self-pay | Admitting: Cardiovascular Disease

## 2016-05-11 ENCOUNTER — Telehealth: Payer: Self-pay

## 2016-05-11 LAB — CBC WITH DIFFERENTIAL/PLATELET
Basophils Absolute: 0 10*3/uL (ref 0.0–0.2)
Basos: 1 %
EOS (ABSOLUTE): 0.1 10*3/uL (ref 0.0–0.4)
Eos: 2 %
HEMOGLOBIN: 15.7 g/dL (ref 13.0–17.7)
Hematocrit: 45.9 % (ref 37.5–51.0)
IMMATURE GRANS (ABS): 0 10*3/uL (ref 0.0–0.1)
Immature Granulocytes: 0 %
LYMPHS: 33 %
Lymphocytes Absolute: 2 10*3/uL (ref 0.7–3.1)
MCH: 30.4 pg (ref 26.6–33.0)
MCHC: 34.2 g/dL (ref 31.5–35.7)
MCV: 89 fL (ref 79–97)
MONOCYTES: 7 %
Monocytes Absolute: 0.4 10*3/uL (ref 0.1–0.9)
NEUTROS PCT: 57 %
Neutrophils Absolute: 3.4 10*3/uL (ref 1.4–7.0)
PLATELETS: 207 10*3/uL (ref 150–379)
RBC: 5.17 x10E6/uL (ref 4.14–5.80)
RDW: 13.1 % (ref 12.3–15.4)
WBC: 5.9 10*3/uL (ref 3.4–10.8)

## 2016-05-11 LAB — COMPREHENSIVE METABOLIC PANEL
ALBUMIN: 4.4 g/dL (ref 3.6–4.8)
ALK PHOS: 125 IU/L — AB (ref 39–117)
ALT: 29 IU/L (ref 0–44)
AST: 20 IU/L (ref 0–40)
Albumin/Globulin Ratio: 1.9 (ref 1.2–2.2)
BUN/Creatinine Ratio: 17 (ref 10–24)
BUN: 16 mg/dL (ref 8–27)
Bilirubin Total: 0.6 mg/dL (ref 0.0–1.2)
CO2: 26 mmol/L (ref 18–29)
CREATININE: 0.93 mg/dL (ref 0.76–1.27)
Calcium: 9.5 mg/dL (ref 8.6–10.2)
Chloride: 103 mmol/L (ref 96–106)
GFR calc Af Amer: 101 mL/min/{1.73_m2} (ref >59)
GFR calc non Af Amer: 87 mL/min/{1.73_m2} (ref >59)
GLUCOSE: 103 mg/dL — AB (ref 65–99)
Globulin, Total: 2.3 g/dL (ref 1.5–4.5)
Potassium: 4.5 mmol/L (ref 3.5–5.2)
Sodium: 144 mmol/L (ref 134–144)
TOTAL PROTEIN: 6.7 g/dL (ref 6.0–8.5)

## 2016-05-11 LAB — LIPID PANEL WITH LDL/HDL RATIO
CHOLESTEROL TOTAL: 160 mg/dL (ref 100–199)
HDL: 47 mg/dL (ref >39)
LDL CALC: 94 mg/dL (ref 0–99)
LDl/HDL Ratio: 2 ratio units (ref 0.0–3.6)
Triglycerides: 93 mg/dL (ref 0–149)
VLDL CHOLESTEROL CAL: 19 mg/dL (ref 5–40)

## 2016-05-11 LAB — HEMOGLOBIN A1C
Est. average glucose Bld gHb Est-mCnc: 105 mg/dL
HEMOGLOBIN A1C: 5.3 % (ref 4.8–5.6)

## 2016-05-11 LAB — CK: CK TOTAL: 75 U/L (ref 24–204)

## 2016-05-11 LAB — PSA: Prostate Specific Ag, Serum: 3.3 ng/mL (ref 0.0–4.0)

## 2016-05-11 LAB — TESTOSTERONE: TESTOSTERONE: 402 ng/dL (ref 264–916)

## 2016-05-11 LAB — TSH: TSH: 1.31 u[IU]/mL (ref 0.450–4.500)

## 2016-05-11 NOTE — Telephone Encounter (Signed)
Patient has been advised. KW 

## 2016-05-11 NOTE — Telephone Encounter (Signed)
-----   Message from Jerrol Banana., MD sent at 05/11/2016  8:14 AM EST ----- Labs okay.

## 2016-11-05 NOTE — Progress Notes (Signed)
Cardiology Office Note  Date:  11/07/2016   ID:  Serita Grit Neider, DOB [birth year], MRN [id number]  PCP:  Jerrol Banana., MD   Chief Complaint  Patient presents with  . other    6 month f/u no complaints today. Meds reviewed verbally with pt.    HPI:  Mr. Ayala is a pleasant 66 year old gentleman with history of hyperlipidemia,  CAD,  S/P PCI of left circumflex and RCA,  HTN,  Dyslipidemia,  VF arrest with polymorphic VT thought secondary to RCA occlusion , who presents  for his underlying coronary artery disease  In follow-up today he reports feeling well, no complaints No chest pain, no shortness of breath on exertion Having some knee discomfort Tolerating medications Weight up over Christmas 223 pounds peak in February 2018 Back down to 217 pounds on today's visit  Blood pressure elevated today, he brings in readings from home 297L up to 892 systolic on a regular basis  Lab work discussed with him in detail, cholesterol into the 160s with higher weight  EKG personally reviewed by myself on todays visit Shows normal sinus rhythm with rate 64 bpm no significant ST or T-wave changes   other past medical history reviewed  admitted on 10/16/14 as a STEMI , presented to [facility] ([place] and experienced Vfib prior to opening his mid Cx occlusion which was successfully stented. He had moderate to severe RCA disease   He  developed polymorphic VT thought secondary to the RCA  Previously unable to tolerate beta blocker as he develops profound bradycardia.  EP was consulted. They recommended Lidocaine drip and stenting x2 of the RCA. once he was stented, he had no further ectopy. He was monitored for another 48 hours without any further events. He was discharged with a 30 day event monitor that did not show any significant arrhythmia by report.   discharged home on ASA and Brilinta  Had MRI that showed scar from prior MI likely causing PVC's, EF 66%, Recommendation was made for no  AICD.  Reports that the brilinta  is expensive , Costing him $200 Was part of a trial for period Of time, trial has since ended   now paying for his own medications    did not tolerate Lipitor 80 mg daily, felt it affected his memory , decreased dose down to 40, continues to have myalgias    EKG on today's visit shows normal sinus rhythm with rate 61 bpm, no significant ST or T-wave changes   PMH:   has no past medical history on file.  PSH:    Past Surgical History:  Procedure Laterality Date  . COLON SURGERY     partial removal of colon  . KNEE ARTHROSCOPY Left   . TONSILLECTOMY      Current Outpatient Prescriptions  Medication Sig Dispense Refill  . Aspirin (ASPIR-81 PO) Take by mouth. STUDY [id number] aspirin/placebo 81 MG tablet    . atorvastatin (LIPITOR) 40 MG tablet Take 1 tablet (40 mg total) by mouth daily. 90 tablet 3  . clopidogrel (PLAVIX) 75 MG tablet Take 1 tablet (75 mg total) by mouth daily. 90 tablet 3  . Coenzyme Q10 (CO Q 10 PO) Take by mouth.    . losartan (COZAAR) 100 MG tablet Take 1 tablet (100 mg total) by mouth daily. 90 tablet 3  . Magnesium 400 MG CAPS Take 1 capsule by mouth daily.    Marland Kitchen omeprazole (PRILOSEC) 20 MG capsule Take 1 capsule (20 mg total) by mouth daily.  90 capsule 3   No current facility-administered medications for this visit.      Allergies:   Patient has no known allergies.   Social History:  The patient  reports that he has never smoked. He has never used smokeless tobacco. He reports that he drinks about 1.8 oz of alcohol per week . He reports that he does not use drugs.   Family History:   family history includes Cancer in his mother; Epilepsy in his son; Heart disease in his father; Hyperlipidemia in his mother.    Review of Systems: Review of Systems  Constitutional: Negative.   Respiratory: Negative.   Cardiovascular: Negative.   Gastrointestinal: Negative.   Musculoskeletal: Positive for joint pain.  Neurological:  Negative.   Psychiatric/Behavioral: Negative.   All other systems reviewed and are negative.    PHYSICAL EXAM: VS:  BP (!) 160/100 (BP Location: Left Arm, Patient Position: Sitting, Cuff Size: Normal)   Pulse 64   Ht 6\' 1"  (1.854 m)   Wt 217 lb 8 oz (98.7 kg)   BMI 28.70 kg/m  , BMI Body mass index is 28.7 kg/m. GEN: Well nourished, well developed, in no acute distress  HEENT: normal  Neck: no JVD, carotid bruits, or masses Cardiac: RRR; no murmurs, rubs, or gallops,no edema  Respiratory:  clear to auscultation bilaterally, normal work of breathing GI: soft, nontender, nondistended, + BS MS: no deformity or atrophy  Skin: warm and dry, no rash Neuro:  Strength and sensation are intact Psych: euthymic mood, full affect    Recent Labs: 05/10/2016: ALT 29; BUN 16; Creatinine, Ser 0.93; Hemoglobin 15.7; Platelets 207; Potassium 4.5; Sodium 144; TSH 1.310    Lipid Panel Lab Results  Component Value Date   CHOL 160 05/10/2016   HDL 47 05/10/2016   LDLCALC 94 05/10/2016   TRIG 93 05/10/2016      Wt Readings from Last 3 Encounters:  11/07/16 217 lb 8 oz (98.7 kg)  05/10/16 223 lb (101.2 kg)  03/06/16 215 lb 4 oz (97.6 kg)       ASSESSMENT AND PLAN:   Coronary artery disease involving native coronary artery of native heart without angina pectoris Currently with no symptoms of angina. No further workup at this time. Continue current medication regimen.  Mixed hyperlipidemia Recommended weight loss, continue Lipitor 40 if LDL continues to run above goal we could increase Lipitor up to 80 mg daily or add Zetia 10 mg daily This was discussed with him in detail  Essential (primary) hypertension blood pressure well controlled at home, elevated on today's visit likely situational   Ventricular tachycardia ([place])  prior history of V. fib arrest prior to stent placement left circumflex and RCA  Felt not to be a ICD candidate based on EP workup at [facility] 2016   History of  coronary artery stent placement Stent placed to RCA 2, left circumflex 1   no anginal symptoms    Total encounter time more than 25 minutes  Greater than 50% was spent in counseling and coordination of care with the patient   Disposition:   F/U  12 months   No orders of the defined types were placed in this encounter.    Signed, Esmond Plants, M.D., Ph.D. 11/07/2016  [place], [place]

## 2016-11-07 ENCOUNTER — Ambulatory Visit (INDEPENDENT_AMBULATORY_CARE_PROVIDER_SITE_OTHER): Payer: BLUE CROSS/BLUE SHIELD | Admitting: Cardiovascular Disease

## 2016-11-07 ENCOUNTER — Encounter: Payer: Self-pay | Admitting: Cardiovascular Disease

## 2016-11-07 VITALS — BP 160/100 | HR 64 | Ht 73.0 in | Wt 217.5 lb

## 2016-11-07 DIAGNOSIS — I25118 Atherosclerotic heart disease of native coronary artery with other forms of angina pectoris: Secondary | ICD-10-CM | POA: Diagnosis not present

## 2016-11-07 DIAGNOSIS — I10 Essential (primary) hypertension: Secondary | ICD-10-CM | POA: Diagnosis not present

## 2016-11-07 DIAGNOSIS — I472 Ventricular tachycardia, unspecified: Secondary | ICD-10-CM

## 2016-11-07 DIAGNOSIS — Z955 Presence of coronary angioplasty implant and graft: Secondary | ICD-10-CM | POA: Diagnosis not present

## 2016-11-07 DIAGNOSIS — E782 Mixed hyperlipidemia: Secondary | ICD-10-CM | POA: Diagnosis not present

## 2016-11-07 NOTE — Patient Instructions (Signed)

## 2016-11-16 ENCOUNTER — Other Ambulatory Visit: Payer: Self-pay | Admitting: Cardiovascular Disease

## 2017-02-23 ENCOUNTER — Other Ambulatory Visit: Payer: Self-pay | Admitting: Cardiovascular Disease

## 2017-06-05 ENCOUNTER — Other Ambulatory Visit: Payer: Self-pay | Admitting: Family Medicine

## 2017-06-05 DIAGNOSIS — I10 Essential (primary) hypertension: Secondary | ICD-10-CM

## 2017-08-06 ENCOUNTER — Other Ambulatory Visit: Payer: Self-pay | Admitting: Family Medicine

## 2017-09-12 ENCOUNTER — Other Ambulatory Visit: Payer: Self-pay | Admitting: Cardiovascular Disease

## 2017-12-15 NOTE — Progress Notes (Signed)
Cardiology Office Note  Date:  12/17/2017   ID:  Kevin Moore, DOB [birth year], MRN [id number]  PCP:  Jerrol Banana., MD   Chief Complaint  Patient presents with  . other    12 month follow up. Meds reviewed by the pt. verbally. "doing well."     HPI:  Kevin Moore is a pleasant 67 year old gentleman with history of hyperlipidemia,  CAD,  S/P PCI of left circumflex and RCA,  HTN,  Dyslipidemia,  VF arrest with polymorphic VT thought secondary to RCA occlusion , who presents  for his underlying coronary artery disease  In follow-up today he reports feeling well, no complaints Denies any significant chest pain Weight continues to fluctuate up and down, slow trend downward  Blood pressure well controlled at home 130s over 70s Has not checked blood pressure at work Typically in the office is elevated even on previous office visits No significant medication side effects  Lab work reviewed No recent lipid panel available Taking a long time to get into see Dr. Rosanna Randy  EKG personally reviewed by myself on todays visit Shows normal sinus rhythm with rate 69 bpm no significant ST or T-wave changes   other past medical history reviewed  admitted on 10/16/14 as a STEMI , presented to [facility] and experienced Vfib prior to opening his mid Cx occlusion which was successfully stented. He had moderate to severe RCA disease   He  developed polymorphic VT thought secondary to the RCA  Previously unable to tolerate beta blocker as he develops profound bradycardia.  EP was consulted. They recommended Lidocaine drip and stenting x2 of the RCA. once he was stented, he had no further ectopy. He was monitored for another 48 hours without any further events. He was discharged with a 30 day event monitor that did not show any significant arrhythmia by report.   discharged home on ASA and Brilinta  Had MRI that showed scar from prior MI likely causing PVC's, EF 66%, Recommendation was made  for no AICD.  Reports that the brilinta  is expensive , Costing him $200 Was part of a trial for period Of time, trial has since ended   now paying for his own medications    did not tolerate Lipitor 80 mg daily, felt it affected his memory , decreased dose down to 40, continues to have myalgias     PMH:   has no past medical history on file.  PSH:    Past Surgical History:  Procedure Laterality Date  . COLON SURGERY     partial removal of colon  . KNEE ARTHROSCOPY Left   . TONSILLECTOMY      Current Outpatient Medications  Medication Sig Dispense Refill  . aspirin EC 81 MG tablet Take 81 mg by mouth daily.     Marland Kitchen atorvastatin (LIPITOR) 80 MG tablet TAKE ONE-HALF TABLET BY MOUTH DAILY 45 tablet 3  . clopidogrel (PLAVIX) 75 MG tablet TAKE 1 TABLET BY MOUTH ONCE A DAY 90 tablet 0  . Coenzyme Q10 (CO Q 10 PO) Take by mouth.    . losartan (COZAAR) 100 MG tablet Take 1 tablet (100 mg total) by mouth daily. 90 tablet 2  . Magnesium 400 MG CAPS Take 1 capsule by mouth daily.    Marland Kitchen omeprazole (PRILOSEC) 20 MG capsule TAKE ONE (1) CAPSULE EACH DAY 30 capsule 11   No current facility-administered medications for this visit.      Allergies:   Patient has no known allergies.  Social History:  The patient  reports that he has never smoked. He has never used smokeless tobacco. He reports that he drinks about 3.0 standard drinks of alcohol per week. He reports that he does not use drugs.   Family History:   family history includes Cancer in his mother; Epilepsy in his son; Heart disease in his father; Hyperlipidemia in his mother.    Review of Systems: Review of Systems  Constitutional: Negative.   Respiratory: Negative.   Cardiovascular: Negative.   Gastrointestinal: Negative.   Musculoskeletal: Positive for joint pain.  Neurological: Negative.   Psychiatric/Behavioral: Negative.   All other systems reviewed and are negative.    PHYSICAL EXAM: VS:  BP (!) 160/100 (BP  Location: Left Arm, Patient Position: Sitting, Cuff Size: Normal)   Pulse 69   Ht 6\' 2"  (1.88 m)   Wt 216 lb 4 oz (98.1 kg)   BMI 27.76 kg/m  , BMI Body mass index is 27.76 kg/m. GEN: Well nourished, well developed, in no acute distress  HEENT: normal  Neck: no JVD, carotid bruits, or masses Cardiac: RRR; no murmurs, rubs, or gallops,no edema  Respiratory:  clear to auscultation bilaterally, normal work of breathing GI: soft, nontender, nondistended, + BS MS: no deformity or atrophy  Skin: warm and dry, no rash Neuro:  Strength and sensation are intact Psych: euthymic mood, full affect    Recent Labs: No results found for requested labs within last 8760 hours.    Lipid Panel Lab Results  Component Value Date   CHOL 160 05/10/2016   HDL 47 05/10/2016   LDLCALC 94 05/10/2016   TRIG 93 05/10/2016      Wt Readings from Last 3 Encounters:  12/17/17 216 lb 4 oz (98.1 kg)  11/07/16 217 lb 8 oz (98.7 kg)  05/10/16 223 lb (101.2 kg)      ASSESSMENT AND PLAN:   Coronary artery disease involving native coronary artery of native heart without angina pectoris Currently with no symptoms of angina. No further workup at this time. Continue current medication regimen.  Stable  Mixed hyperlipidemia Recommended weight loss, continue Lipitor 40 Lab work has been ordered If LDL continues to run high, we would add Zetia 10 mg daily  Essential (primary) hypertension Elevated blood pressure likely situational No medication changes made Recommended he monitor blood pressure at work and at the end of the day  Ventricular tachycardia ([place])  prior history of V. fib arrest prior to stent placement left circumflex and RCA  Felt not to be a ICD candidate based on EP workup at [facility] 2016  No recurrent arrhythmia  History of coronary artery stent placement Stent placed to RCA 2, left circumflex 1   no anginal symptoms  Stressed importance of aggressive lipids   Total encounter time  more than 25 minutes  Greater than 50% was spent in counseling and coordination of care with the patient   Disposition:   F/U  12 months   Orders Placed This Encounter  Procedures  . EKG 12-Lead     Signed, Esmond Plants, M.D., Ph.D. 12/17/2017  [place], [place]

## 2017-12-17 ENCOUNTER — Encounter: Payer: Self-pay | Admitting: Cardiovascular Disease

## 2017-12-17 ENCOUNTER — Ambulatory Visit (INDEPENDENT_AMBULATORY_CARE_PROVIDER_SITE_OTHER): Payer: PPO | Admitting: Cardiovascular Disease

## 2017-12-17 VITALS — BP 160/100 | HR 69 | Ht 74.0 in | Wt 216.2 lb

## 2017-12-17 DIAGNOSIS — I472 Ventricular tachycardia, unspecified: Secondary | ICD-10-CM

## 2017-12-17 DIAGNOSIS — I25118 Atherosclerotic heart disease of native coronary artery with other forms of angina pectoris: Secondary | ICD-10-CM | POA: Diagnosis not present

## 2017-12-17 DIAGNOSIS — Z23 Encounter for immunization: Secondary | ICD-10-CM

## 2017-12-17 DIAGNOSIS — I10 Essential (primary) hypertension: Secondary | ICD-10-CM

## 2017-12-17 DIAGNOSIS — E782 Mixed hyperlipidemia: Secondary | ICD-10-CM | POA: Diagnosis not present

## 2017-12-17 NOTE — Patient Instructions (Addendum)
Monitor blood pressure  Medication Instructions:   No medication changes made  Labwork:  Labs in the office another day Lipid panel, LFTS, HBA1C, BMP  Testing/Procedures:  No further testing at this time   Follow-Up: It was a pleasure seeing you in the office today. Please call us if you have new issues that need to be addressed before your next appt.  [phone]  Your physician wants you to follow-up in: 12 months.  You will receive a reminder letter in the mail two months in advance. If you don't receive a letter, please call our office to schedule the follow-up appointment.  If you need a refill on your cardiac medications before your next appointment, please call your pharmacy.  For educational health videos Log in to : [email or web address] Or : [email or web address], password : [email or web address]

## 2017-12-19 ENCOUNTER — Other Ambulatory Visit: Payer: Self-pay | Admitting: Cardiovascular Disease

## 2018-01-02 DIAGNOSIS — Z872 Personal history of diseases of the skin and subcutaneous tissue: Secondary | ICD-10-CM | POA: Diagnosis not present

## 2018-01-02 DIAGNOSIS — L570 Actinic keratosis: Secondary | ICD-10-CM | POA: Diagnosis not present

## 2018-01-02 DIAGNOSIS — Z08 Encounter for follow-up examination after completed treatment for malignant neoplasm: Secondary | ICD-10-CM | POA: Diagnosis not present

## 2018-01-02 DIAGNOSIS — Z85828 Personal history of other malignant neoplasm of skin: Secondary | ICD-10-CM | POA: Diagnosis not present

## 2018-01-02 DIAGNOSIS — L821 Other seborrheic keratosis: Secondary | ICD-10-CM | POA: Diagnosis not present

## 2018-01-02 DIAGNOSIS — D2261 Melanocytic nevi of right upper limb, including shoulder: Secondary | ICD-10-CM | POA: Diagnosis not present

## 2018-01-02 DIAGNOSIS — D225 Melanocytic nevi of trunk: Secondary | ICD-10-CM | POA: Diagnosis not present

## 2018-01-02 DIAGNOSIS — X32XXXA Exposure to sunlight, initial encounter: Secondary | ICD-10-CM | POA: Diagnosis not present

## 2018-03-08 ENCOUNTER — Other Ambulatory Visit: Payer: Self-pay | Admitting: Family Medicine

## 2018-03-08 DIAGNOSIS — I10 Essential (primary) hypertension: Secondary | ICD-10-CM

## 2018-03-08 MED ORDER — LOSARTAN POTASSIUM 100 MG PO TABS: 100.0000 mg | ORAL_TABLET | Freq: Every day | ORAL | 2 refills | Status: DC

## 2018-03-08 NOTE — Telephone Encounter (Signed)
Patient nees refill on Losartan 100  Mg. sent to [facility] today.  This drug store is closing permanently today.

## 2018-03-11 ENCOUNTER — Telehealth: Payer: Self-pay | Admitting: Family Medicine

## 2018-03-11 NOTE — Telephone Encounter (Signed)
There is a Producer, television/film/video of Losartan.  Pharmacist said Valsartan and Telmisartan is available.   Please call new drug into [facility] as [facility] is closing today at 5:00 for good.

## 2018-03-11 NOTE — Telephone Encounter (Signed)
Telmisartan 

## 2018-03-11 NOTE — Telephone Encounter (Signed)
Mg and SIg? Please advise. Thanks!

## 2018-03-11 NOTE — Telephone Encounter (Signed)
Which would you like him to have

## 2018-03-12 MED ORDER — TELMISARTAN 40 MG PO TABS: 40.0000 mg | ORAL_TABLET | Freq: Every day | ORAL | 3 refills | Status: DC

## 2018-03-12 NOTE — Telephone Encounter (Signed)
40 mg daily

## 2018-03-14 ENCOUNTER — Other Ambulatory Visit: Payer: Self-pay | Admitting: Cardiovascular Disease

## 2018-03-14 NOTE — Telephone Encounter (Signed)
Called patient to make him aware that Dr. Alben Spittle office changed his Losartan to Telmisartan. He states he has been out of Losartan since Sunday.   I called Dr. Alben Spittle office to let them know he has been out of losartan since Sunday 03/10/2018 and has not picked up Telmisartan because he was unsure if it was ready.   [facility] and they stated his medication was ready for pickup.   Called patient to make him aware.

## 2018-03-14 NOTE — Telephone Encounter (Addendum)
°*  STAT* If patient is at the pharmacy, call can be transferred to refill team.   1. Which medications need to be refilled? (please list name of each medication and dose if known)     Losartan unknown dose   2. Which pharmacy/location (including street and city if local pharmacy) is medication to be sent to? [facility]   3. Do they need a 30 day or 90 day supply? [place]

## 2018-04-01 ENCOUNTER — Encounter: Payer: BLUE CROSS/BLUE SHIELD | Admitting: Family Medicine

## 2018-04-30 ENCOUNTER — Other Ambulatory Visit: Payer: Self-pay | Admitting: Family Medicine

## 2018-04-30 NOTE — Telephone Encounter (Signed)
Pt requesting Atorvastatin 80 MG and Omeprazole 20 MG be sent to [facility]

## 2018-05-01 MED ORDER — OMEPRAZOLE 20 MG PO CPDR: DELAYED_RELEASE_CAPSULE | ORAL | 3 refills | Status: DC

## 2018-05-01 MED ORDER — ATORVASTATIN CALCIUM 80 MG PO TABS: 40.0000 mg | ORAL_TABLET | Freq: Every day | ORAL | 3 refills | Status: DC

## 2018-06-17 ENCOUNTER — Other Ambulatory Visit: Payer: Self-pay | Admitting: *Deleted

## 2018-06-17 MED ORDER — CLOPIDOGREL BISULFATE 75 MG PO TABS: 75.0000 mg | ORAL_TABLET | Freq: Every day | ORAL | 2 refills | Status: DC

## 2018-06-18 ENCOUNTER — Encounter: Payer: PPO | Admitting: Family Medicine

## 2018-10-11 DIAGNOSIS — M9901 Segmental and somatic dysfunction of cervical region: Secondary | ICD-10-CM | POA: Diagnosis not present

## 2018-10-11 DIAGNOSIS — M5412 Radiculopathy, cervical region: Secondary | ICD-10-CM | POA: Diagnosis not present

## 2018-10-11 DIAGNOSIS — M542 Cervicalgia: Secondary | ICD-10-CM | POA: Diagnosis not present

## 2018-10-11 DIAGNOSIS — M7918 Myalgia, other site: Secondary | ICD-10-CM | POA: Diagnosis not present

## 2018-10-17 ENCOUNTER — Ambulatory Visit (INDEPENDENT_AMBULATORY_CARE_PROVIDER_SITE_OTHER): Payer: PPO | Admitting: Family Medicine

## 2018-10-17 ENCOUNTER — Encounter: Payer: Self-pay | Admitting: Family Medicine

## 2018-10-17 ENCOUNTER — Other Ambulatory Visit: Payer: Self-pay

## 2018-10-17 VITALS — BP 130/68 | HR 58 | Temp 98.3°F | Resp 16 | Ht 74.0 in | Wt 224.0 lb

## 2018-10-17 DIAGNOSIS — Z23 Encounter for immunization: Secondary | ICD-10-CM

## 2018-10-17 DIAGNOSIS — Z125 Encounter for screening for malignant neoplasm of prostate: Secondary | ICD-10-CM | POA: Diagnosis not present

## 2018-10-17 DIAGNOSIS — Z0000 Encounter for general adult medical examination without abnormal findings: Secondary | ICD-10-CM | POA: Diagnosis not present

## 2018-10-17 DIAGNOSIS — I25118 Atherosclerotic heart disease of native coronary artery with other forms of angina pectoris: Secondary | ICD-10-CM

## 2018-10-17 DIAGNOSIS — I10 Essential (primary) hypertension: Secondary | ICD-10-CM | POA: Diagnosis not present

## 2018-10-17 DIAGNOSIS — E782 Mixed hyperlipidemia: Secondary | ICD-10-CM

## 2018-10-17 NOTE — Progress Notes (Signed)
Patient: Kevin Moore, Male    DOB: 28-Jun-1950, 68 y.o.   MRN: [id number] Visit Date: 10/17/2018  Today's Provider: Wilhemena Durie, MD   Chief Complaint  Patient presents with  . Annual Exam   Subjective:     Annual physical exam Kevin Moore is a 68 y.o. male who presents today for health maintenance and complete physical. He feels well. He reports exercising occasionally. He reports he is sleeping well. He feels well and has no complaints.   Tdap- 07/25/2011  Last colonoscopy 09/11/11 active colitis. Patient had colon surgery after that and was told to repeat colonoscopy in 10 years   Review of Systems  Constitutional: Negative.   HENT: Negative.   Eyes: Negative.   Respiratory: Negative.   Cardiovascular: Negative.   Gastrointestinal: Negative.   Endocrine: Negative.   Genitourinary: Negative.   Musculoskeletal: Negative.   Skin: Negative.   Allergic/Immunologic: Negative.   Neurological: Negative.   Hematological: Negative.   Psychiatric/Behavioral: Negative.     Social History      He  reports that he has never smoked. He has never used smokeless tobacco. He reports current alcohol use of about 3.0 standard drinks of alcohol per week. He reports that he does not use drugs.       Social History   Socioeconomic History  . Marital status: Unknown    Spouse name: Not on file  . Number of children: Not on file  . Years of education: Not on file  . Highest education level: Not on file  Occupational History  . Not on file  Social Needs  . Financial resource strain: Not on file  . Food insecurity    Worry: Not on file    Inability: Not on file  . Transportation needs    Medical: Not on file    Non-medical: Not on file  Tobacco Use  . Smoking status: Never Smoker  . Smokeless tobacco: Never Used  Substance and Sexual Activity  . Alcohol use: Yes    Alcohol/week: 3.0 standard drinks    Types: 3 Cans of beer per week  . Drug use:  No  . Sexual activity: Yes  Lifestyle  . Physical activity    Days per week: Not on file    Minutes per session: Not on file  . Stress: Not on file  Relationships  . Social Herbalist on phone: Not on file    Gets together: Not on file    Attends religious service: Not on file    Active member of club or organization: Not on file    Attends meetings of clubs or organizations: Not on file    Relationship status: Not on file  Other Topics Concern  . Not on file  Social History Narrative  . Not on file    No past medical history on file.   Patient Active Problem List   Diagnosis Date Noted  . History of coronary artery stent placement 03/06/2016  . Beat, premature ventricular 01/26/2015  . Ventricular tachycardia ([place]) 01/26/2015  . Heart attack ([place]) 10/16/2014  . Myocardial infarction ([place]) 10/16/2014  . Coronary artery disease 08/25/2014  . Essential (primary) hypertension 08/25/2014  . Acid reflux 08/25/2014  . HLD (hyperlipidemia) 08/25/2014    Past Surgical History:  Procedure Laterality Date  . COLON SURGERY     partial removal of colon  . KNEE ARTHROSCOPY Left   . TONSILLECTOMY  Family History        Family Status  Relation Name Status  . Mother  Deceased  . Father  Deceased at age 19       died from meningitis  . Son Tama Gander  . Brother  Alive       protein def/allergy to glutein  . Son Erlene Quan Alive        His family history includes Cancer in his mother; Epilepsy in his son; Heart disease in his father; Hyperlipidemia in his mother.      No Known Allergies   Current Outpatient Medications:  .  aspirin EC 81 MG tablet, Take 81 mg by mouth daily. , Disp: , Rfl:  .  atorvastatin (LIPITOR) 80 MG tablet, Take 0.5 tablets (40 mg total) by mouth daily., Disp: 45 tablet, Rfl: 3 .  clopidogrel (PLAVIX) 75 MG tablet, Take 1 tablet (75 mg total) by mouth daily., Disp: 90 tablet, Rfl: 2 .  Magnesium 400 MG CAPS, Take 1 capsule by mouth  daily., Disp: , Rfl:  .  omeprazole (PRILOSEC) 20 MG capsule, TAKE ONE (1) CAPSULE EACH DAY, Disp: 90 capsule, Rfl: 3 .  telmisartan (MICARDIS) 40 MG tablet, Take 1 tablet (40 mg total) by mouth daily., Disp: 90 tablet, Rfl: 3 .  Coenzyme Q10 (CO Q 10 PO), Take by mouth., Disp: , Rfl:    Patient Care Team: Jerrol Banana., MD as PCP - General (Family Medicine) Minna Merritts, MD as Consulting Physician (Cardiology)    Objective:    Vitals: BP 130/68   Pulse (!) 58   Temp 98.3 F (36.8 C)   Resp 16   Ht 6\' 2"  (1.88 m)   Wt 224 lb (101.6 kg)   SpO2 98%   BMI 28.76 kg/m    Vitals:   10/17/18 1005  BP: 130/68  Pulse: (!) 58  Resp: 16  Temp: 98.3 F (36.8 C)  SpO2: 98%  Weight: 224 lb (101.6 kg)  Height: 6\' 2"  (1.88 m)     Physical Exam Vitals signs reviewed.  Constitutional:      Appearance: Normal appearance.  HENT:     Head: Normocephalic and atraumatic.     Nose: Nose normal.  Eyes:     General: No scleral icterus.    Conjunctiva/sclera: Conjunctivae normal.  Neck:     Vascular: No carotid bruit.  Cardiovascular:     Rate and Rhythm: Normal rate and regular rhythm.     Heart sounds: Normal heart sounds.  Pulmonary:     Breath sounds: Normal breath sounds.  Abdominal:     Palpations: Abdomen is soft.  Genitourinary:    Penis: Normal.      Scrotum/Testes: Normal.     Prostate: Normal.     Rectum: Normal.  Musculoskeletal:     Right lower leg: No edema.     Left lower leg: No edema.  Lymphadenopathy:     Cervical: No cervical adenopathy.  Skin:    General: Skin is warm and dry.  Neurological:     General: No focal deficit present.     Mental Status: He is alert and oriented to person, place, and time.  Psychiatric:        Mood and Affect: Mood normal.        Behavior: Behavior normal.        Thought Content: Thought content normal.        Judgment: Judgment normal.      Depression Screen  PHQ 2/9 Scores 10/17/2018 01/21/2015 12/01/2014   PHQ - 2 Score 0 0 0  PHQ- 9 Score - 0 0       Assessment & Plan:     Routine Health Maintenance and Physical Exam  Exercise Activities and Dietary recommendations Goals   None     Immunization History  Administered Date(s) Administered  . Influenza,inj,Quad PF,6+ Mos 02/25/2015, 12/17/2017  . Tdap 07/25/2011  . Zoster 08/21/2012    Health Maintenance  Topic Date Due  . FOOT EXAM  07/02/1962  . OPHTHALMOLOGY EXAM  07/02/1962  . HEMOGLOBIN A1C  11/07/2016  . PNA vac Low Risk Adult (1 of 2 - PCV13) 07/01/2017  . INFLUENZA VACCINE  10/26/2018  . TETANUS/TDAP  07/24/2021  . COLONOSCOPY  09/10/2021  . Hepatitis C Screening  Completed   1. Annual physical exam   2. Essential (primary) hypertension  - CBC with Differential/Platelet - Comprehensive metabolic panel  3. Mixed hyperlipidemia  - Lipid panel - TSH  4. Coronary artery disease of native artery of native heart with stable angina pectoris ([place]) All risk factors treated.  5. Prostate cancer screening  - PSA  6. Need for pneumococcal vaccine  - Pneumococcal polysaccharide vaccine 23-valent greater than or equal to 2yo subcutaneous/IM   Discussed health benefits of physical activity, and encouraged him to engage in regular exercise appropriate for his age and condition.    Richard Cranford Mon, MD  [facility]

## 2018-10-18 LAB — CBC WITH DIFFERENTIAL/PLATELET
Basophils Absolute: 0 10*3/uL (ref 0.0–0.2)
Basos: 1 %
EOS (ABSOLUTE): 0.1 10*3/uL (ref 0.0–0.4)
Eos: 1 %
Hematocrit: 42.2 % (ref 37.5–51.0)
Hemoglobin: 14.8 g/dL (ref 13.0–17.7)
Immature Grans (Abs): 0 10*3/uL (ref 0.0–0.1)
Immature Granulocytes: 0 %
Lymphocytes Absolute: 1.8 10*3/uL (ref 0.7–3.1)
Lymphs: 32 %
MCH: 30.6 pg (ref 26.6–33.0)
MCHC: 35.1 g/dL (ref 31.5–35.7)
MCV: 87 fL (ref 79–97)
Monocytes Absolute: 0.5 10*3/uL (ref 0.1–0.9)
Monocytes: 9 %
Neutrophils Absolute: 3.3 10*3/uL (ref 1.4–7.0)
Neutrophils: 57 %
Platelets: 214 10*3/uL (ref 150–450)
RBC: 4.84 x10E6/uL (ref 4.14–5.80)
RDW: 11.5 % — ABNORMAL LOW (ref 11.6–15.4)
WBC: 5.7 10*3/uL (ref 3.4–10.8)

## 2018-10-18 LAB — LIPID PANEL
Chol/HDL Ratio: 3.6 ratio (ref 0.0–5.0)
Cholesterol, Total: 155 mg/dL (ref 100–199)
HDL: 43 mg/dL (ref >39)
LDL Calculated: 95 mg/dL (ref 0–99)
Triglycerides: 85 mg/dL (ref 0–149)
VLDL Cholesterol Cal: 17 mg/dL (ref 5–40)

## 2018-10-18 LAB — COMPREHENSIVE METABOLIC PANEL
ALT: 24 IU/L (ref 0–44)
AST: 20 IU/L (ref 0–40)
Albumin/Globulin Ratio: 2 (ref 1.2–2.2)
Albumin: 4.1 g/dL (ref 3.8–4.8)
Alkaline Phosphatase: 114 IU/L (ref 39–117)
BUN/Creatinine Ratio: 15 (ref 10–24)
BUN: 13 mg/dL (ref 8–27)
Bilirubin Total: 0.8 mg/dL (ref 0.0–1.2)
CO2: 22 mmol/L (ref 20–29)
Calcium: 9 mg/dL (ref 8.6–10.2)
Chloride: 104 mmol/L (ref 96–106)
Creatinine, Ser: 0.89 mg/dL (ref 0.76–1.27)
GFR calc Af Amer: 103 mL/min/{1.73_m2} (ref >59)
GFR calc non Af Amer: 89 mL/min/{1.73_m2} (ref >59)
Globulin, Total: 2.1 g/dL (ref 1.5–4.5)
Glucose: 97 mg/dL (ref 65–99)
Potassium: 4.3 mmol/L (ref 3.5–5.2)
Sodium: 142 mmol/L (ref 134–144)
Total Protein: 6.2 g/dL (ref 6.0–8.5)

## 2018-10-18 LAB — TSH: TSH: 1.2 u[IU]/mL (ref 0.450–4.500)

## 2018-10-18 LAB — PSA: Prostate Specific Ag, Serum: 4.2 ng/mL — ABNORMAL HIGH (ref 0.0–4.0)

## 2018-10-22 ENCOUNTER — Telehealth: Payer: Self-pay

## 2018-10-22 NOTE — Telephone Encounter (Signed)
-----   Message from Jerrol Banana., MD sent at 10/21/2018  7:12 PM EDT ----- Labs stable.

## 2018-10-22 NOTE — Telephone Encounter (Signed)
Patient notified of lab results. He requested that lab be faxed to [phone]. Labs sent.

## 2019-01-02 DIAGNOSIS — L570 Actinic keratosis: Secondary | ICD-10-CM | POA: Diagnosis not present

## 2019-01-02 DIAGNOSIS — L820 Inflamed seborrheic keratosis: Secondary | ICD-10-CM | POA: Diagnosis not present

## 2019-01-02 DIAGNOSIS — D2271 Melanocytic nevi of right lower limb, including hip: Secondary | ICD-10-CM | POA: Diagnosis not present

## 2019-01-02 DIAGNOSIS — R208 Other disturbances of skin sensation: Secondary | ICD-10-CM | POA: Diagnosis not present

## 2019-01-02 DIAGNOSIS — D2262 Melanocytic nevi of left upper limb, including shoulder: Secondary | ICD-10-CM | POA: Diagnosis not present

## 2019-01-02 DIAGNOSIS — C44619 Basal cell carcinoma of skin of left upper limb, including shoulder: Secondary | ICD-10-CM | POA: Diagnosis not present

## 2019-01-02 DIAGNOSIS — D485 Neoplasm of uncertain behavior of skin: Secondary | ICD-10-CM | POA: Diagnosis not present

## 2019-01-02 DIAGNOSIS — D2261 Melanocytic nevi of right upper limb, including shoulder: Secondary | ICD-10-CM | POA: Diagnosis not present

## 2019-01-02 DIAGNOSIS — L821 Other seborrheic keratosis: Secondary | ICD-10-CM | POA: Diagnosis not present

## 2019-01-02 DIAGNOSIS — Z85828 Personal history of other malignant neoplasm of skin: Secondary | ICD-10-CM | POA: Diagnosis not present

## 2019-02-07 ENCOUNTER — Encounter (INDEPENDENT_AMBULATORY_CARE_PROVIDER_SITE_OTHER): Payer: Self-pay

## 2019-03-05 ENCOUNTER — Other Ambulatory Visit: Payer: Self-pay | Admitting: Cardiovascular Disease

## 2019-03-05 ENCOUNTER — Other Ambulatory Visit: Payer: Self-pay | Admitting: Family Medicine

## 2019-03-11 DIAGNOSIS — C44619 Basal cell carcinoma of skin of left upper limb, including shoulder: Secondary | ICD-10-CM | POA: Diagnosis not present

## 2019-06-02 ENCOUNTER — Other Ambulatory Visit: Payer: Self-pay | Admitting: Cardiovascular Disease

## 2019-06-02 ENCOUNTER — Other Ambulatory Visit: Payer: Self-pay | Admitting: Family Medicine

## 2019-06-02 NOTE — Telephone Encounter (Signed)
Refill for ATORVASTATIN CALCIUM 80 MG TAB. prescription expired on 05/01/2019

## 2019-06-02 NOTE — Telephone Encounter (Signed)
Attempted to schedule.  LMOV to call office.  ° °

## 2019-06-02 NOTE — Telephone Encounter (Signed)
Please schedule overdue office visit with Dr. Gollan. Thank you! 

## 2019-06-02 NOTE — Telephone Encounter (Signed)
Scheduled on 3/16 w/ Gollan

## 2019-06-03 NOTE — Telephone Encounter (Signed)
clopidogrel (PLAVIX) 75 MG tablet 90 tablet 0 06/03/2019    Sig: TAKE 1 TABLET BY [place], [place]

## 2019-06-08 NOTE — Progress Notes (Signed)
Cardiology Office Note  Date:  06/10/2019   ID:  Kevin Moore, DOB [birth year], MRN [id number]  PCP:  Jerrol Banana., MD   Chief Complaint  Patient presents with  . Other    12 month follow up. Meds reviewed verbally with patient.     HPI:  [name] is a pleasant 69 year old gentleman with history of hyperlipidemia,  CAD,  S/P PCI of left circumflex and RCA,  HTN,  Dyslipidemia,  VF arrest with polymorphic VT 09/2014  thought secondary to RCA occlusion ,  who presents  for his underlying coronary artery disease  In follow-up today he reports feeling well, no complaints Denies any significant chest pain Weight continues to fluctuate up and down, slow trend downward  diastolic elevated at home, 70 to 90s Elevated here  Lab work reviewed Total cholesterol 155 LDL 95 in July 2020 On atorvastatin 40 Felt he had memory problems on Lipitor 80 Some myalgias on Lipitor 40  Bradycardia on B-Blocker, tried previously  EKG personally reviewed by myself on todays visit Shows normal sinus rhythm with rate 64 bpm no significant ST or T-wave changes   other past medical history reviewed  admitted on 10/16/14 as a STEMI , presented to [facility] and experienced Vfib prior to opening his mid Cx occlusion which was successfully stented. He had moderate to severe RCA disease   He  developed polymorphic VT thought secondary to the RCA  Previously unable to tolerate beta blocker as he develops profound bradycardia.  EP was consulted. They recommended Lidocaine drip and stenting x2 of the RCA. once he was stented, he had no further ectopy.   Had MRI that showed scar from prior MI likely causing PVC's, EF 66%, Recommendation was made for no AICD.   PMH:   has no past medical history on file.  PSH:    Past Surgical History:  Procedure Laterality Date  . COLON SURGERY     partial removal of colon  . KNEE ARTHROSCOPY Left   . TONSILLECTOMY      Current Outpatient  Medications  Medication Sig Dispense Refill  . atorvastatin (LIPITOR) 40 MG tablet Take 40 mg by mouth daily.    . clopidogrel (PLAVIX) 75 MG tablet TAKE 1 TABLET BY MOUTH DAILY 90 tablet 0  . Magnesium 400 MG CAPS Take 1 capsule by mouth daily.    Marland Kitchen omeprazole (PRILOSEC) 20 MG capsule TAKE ONE (1) CAPSULE EACH DAY 90 capsule 3  . telmisartan (MICARDIS) 40 MG tablet TAKE 1 TABLET BY MOUTH DAILY 90 tablet 3   No current facility-administered medications for this visit.     Allergies:   Patient has no known allergies.   Social History:  The patient  reports that he has never smoked. He has never used smokeless tobacco. He reports current alcohol use of about 3.0 standard drinks of alcohol per week. He reports that he does not use drugs.   Family History:   family history includes Cancer in his mother; Epilepsy in his son; Heart disease in his father; Hyperlipidemia in his mother.    Review of Systems: Review of Systems  Constitutional: Negative.   Respiratory: Negative.   Cardiovascular: Negative.   Gastrointestinal: Negative.   Musculoskeletal: Positive for joint pain.  Neurological: Negative.   Psychiatric/Behavioral: Negative.   All other systems reviewed and are negative.   PHYSICAL EXAM: VS:  BP (!) 148/96 (BP Location: Left Arm, Patient Position: Sitting, Cuff Size: Normal)   Pulse 64  Ht 6\' 1"  (1.854 m)   Wt 223 lb 8 oz (101.4 kg)   SpO2 98%   BMI 29.49 kg/m  , BMI Body mass index is 29.49 kg/m. Constitutional:  oriented to person, place, and time. No distress.  HENT:  Head: Grossly normal Eyes:  no discharge. No scleral icterus.  Neck: No JVD, no carotid bruits  Cardiovascular: Regular rate and rhythm, no murmurs appreciated Pulmonary/Chest: Clear to auscultation bilaterally, no wheezes or rails Abdominal: Soft.  no distension.  no tenderness.  Musculoskeletal: Normal range of motion Neurological:  normal muscle tone. Coordination normal. No atrophy Skin: Skin  warm and dry Psychiatric: normal affect, pleasant  Recent Labs: 10/17/2018: ALT 24; BUN 13; Creatinine, Ser 0.89; Hemoglobin 14.8; Platelets 214; Potassium 4.3; Sodium 142; TSH 1.200    Lipid Panel Lab Results  Component Value Date   CHOL 155 10/17/2018   HDL 43 10/17/2018   LDLCALC 95 10/17/2018   TRIG 85 10/17/2018      Wt Readings from Last 3 Encounters:  06/10/19 223 lb 8 oz (101.4 kg)  10/17/18 224 lb (101.6 kg)  12/17/17 216 lb 4 oz (98.1 kg)     ASSESSMENT AND PLAN:  Coronary artery disease involving native coronary artery of native heart without angina pectoris Currently with no symptoms of angina. No further workup at this time. Continue current medication regimen.  Mixed hyperlipidemia continue Lipitor 40  add Zetia 10 mg daily Goal LD <70  Essential (primary) hypertension Elevated blood pressure  Change the micardis to Micardis HCT 40/12.5  Ventricular tachycardia (HCC)  prior history of V. fib arrest prior to stent placement left circumflex and RCA  Felt not to be a ICD candidate based on EP workup at [facility] 2016  No recurrent arrhythmia Stable  History of coronary artery stent placement Stent placed to RCA 2, left circumflex 1   no anginal symptoms  zetia added, Currently with no symptoms of angina. No further workup at this time.    Total encounter time more than 25 minutes  Greater than 50% was spent in counseling and coordination of care with the patient   Disposition:   F/U  12 months   Orders Placed This Encounter  Procedures  . EKG 12-Lead     Signed, Esmond Plants, M.D., Ph.D. 06/10/2019  [place], [place]

## 2019-06-10 ENCOUNTER — Ambulatory Visit (INDEPENDENT_AMBULATORY_CARE_PROVIDER_SITE_OTHER): Payer: PPO | Admitting: Cardiovascular Disease

## 2019-06-10 ENCOUNTER — Encounter: Payer: Self-pay | Admitting: Cardiovascular Disease

## 2019-06-10 ENCOUNTER — Other Ambulatory Visit: Payer: Self-pay

## 2019-06-10 VITALS — BP 148/96 | HR 64 | Ht 73.0 in | Wt 223.5 lb

## 2019-06-10 DIAGNOSIS — I25118 Atherosclerotic heart disease of native coronary artery with other forms of angina pectoris: Secondary | ICD-10-CM | POA: Diagnosis not present

## 2019-06-10 DIAGNOSIS — I472 Ventricular tachycardia, unspecified: Secondary | ICD-10-CM

## 2019-06-10 DIAGNOSIS — E782 Mixed hyperlipidemia: Secondary | ICD-10-CM | POA: Diagnosis not present

## 2019-06-10 DIAGNOSIS — I10 Essential (primary) hypertension: Secondary | ICD-10-CM

## 2019-06-10 MED ORDER — CLOPIDOGREL BISULFATE 75 MG PO TABS: 75.0000 mg | ORAL_TABLET | Freq: Every day | ORAL | 3 refills | Status: DC

## 2019-06-10 MED ORDER — OMEPRAZOLE 20 MG PO CPDR: DELAYED_RELEASE_CAPSULE | ORAL | 3 refills | Status: DC

## 2019-06-10 MED ORDER — TELMISARTAN-HCTZ 40-12.5 MG PO TABS: 1.0000 | ORAL_TABLET | Freq: Every day | ORAL | 3 refills | Status: DC

## 2019-06-10 MED ORDER — EZETIMIBE 10 MG PO TABS: 10.0000 mg | ORAL_TABLET | Freq: Every day | ORAL | 3 refills | Status: DC

## 2019-06-10 MED ORDER — ATORVASTATIN CALCIUM 40 MG PO TABS: ORAL_TABLET | ORAL | 3 refills | Status: DC

## 2019-06-10 NOTE — Patient Instructions (Addendum)
Medication Instructions:  Please start zetia 10 mg daily Please change micardis HCT 40/12.5 daily  Monitor BP 2 hours after taking medications  If you need a refill on your cardiac medications before your next appointment, please call your pharmacy.    Lab work: No new labs needed   If you have labs (blood work) drawn today and your tests are completely normal, you will receive your results only by: Marland Kitchen MyChart Message (if you have MyChart) OR . A paper copy in the mail If you have any lab test that is abnormal or we need to change your treatment, we will call you to review the results.   Testing/Procedures: No new testing needed   Follow-Up: At [facility], you and your health needs are our priority.  As part of our continuing mission to provide you with exceptional heart care, we have created designated Provider Care Teams.  These Care Teams include your primary Cardiologist (physician) and Advanced Practice Providers (APPs -  Physician Assistants and Nurse Practitioners) who all work together to provide you with the care you need, when you need it.  . You will need a follow up appointment in 12 months   . Providers on your designated Care Team:   . Murray Hodgkins, NP . Christell Faith, PA-C . Marrianne Mood, PA-C  Any Other Special Instructions Will Be Listed Below (If Applicable).  For educational health videos Log in to : [email or web address] Or : [email or web address], password : [email or web address]   How to Take Your Blood Pressure You can take your blood pressure at home with a machine. You may need to check your blood pressure at home:  To check if you have high blood pressure (hypertension).  To check your blood pressure over time.  To make sure your blood pressure medicine is working. Supplies needed: You will need a blood pressure machine, or monitor. You can buy one at a drugstore or online. When choosing one:  Choose one with an arm cuff.  Choose one that wraps around your  upper arm. Only one finger should fit between your arm and the cuff.  Do not choose one that measures your blood pressure from your wrist or finger. Your doctor can suggest a monitor. How to prepare Avoid these things for 30 minutes before checking your blood pressure:  Drinking caffeine.  Drinking alcohol.  Eating.  Smoking.  Exercising. Five minutes before checking your blood pressure:  Pee.  Sit in a dining chair. Avoid sitting in a soft couch or armchair.  Be quiet. Do not talk. How to take your blood pressure Follow the instructions that came with your machine. If you have a digital blood pressure monitor, these may be the instructions: 1. Sit up straight. 2. Place your feet on the floor. Do not cross your ankles or legs. 3. Rest your left arm at the level of your heart. You may rest it on a table, desk, or chair. 4. Pull up your shirt sleeve. 5. Wrap the blood pressure cuff around the upper part of your left arm. The cuff should be 1 inch (2.5 cm) above your elbow. It is best to wrap the cuff around bare skin. 6. Fit the cuff snugly around your arm. You should be able to place only one finger between the cuff and your arm. 7. Put the cord inside the groove of your elbow. 8. Press the power button. 9. Sit quietly while the cuff fills with air and loses air. 10. Write down the numbers on  the screen. 11. Wait 2-3 minutes and then repeat steps 1-10. What do the numbers mean? Two numbers make up your blood pressure. The first number is called systolic pressure. The second is called diastolic pressure. An example of a blood pressure reading is "120 over 80" (or 120/80). If you are an adult and do not have a medical condition, use this guide to find out if your blood pressure is normal: Normal  First number: below 120.  Second number: below 80. Elevated  First number: 120-129.  Second number: below 80. Hypertension stage 1  First number: 130-139.  Second number:  80-89. Hypertension stage 2  First number: 140 or above.  Second number: 40 or above. Your blood pressure is above normal even if only the top or bottom number is above normal. Follow these instructions at home:  Check your blood pressure as often as your doctor tells you to.  Take your monitor to your next doctor's appointment. Your doctor will: ? Make sure you are using it correctly. ? Make sure it is working right.  Make sure you understand what your blood pressure numbers should be.  Tell your doctor if your medicines are causing side effects. Contact a doctor if:  Your blood pressure keeps being high. Get help right away if:  Your first blood pressure number is higher than 180.  Your second blood pressure number is higher than 120. This information is not intended to replace advice given to you by your health care provider. Make sure you discuss any questions you have with your health care provider. Document Revised: 02/23/2017 Document Reviewed: 08/20/2015 Elsevier Patient Education  2020 [employer].

## 2019-07-23 DIAGNOSIS — L821 Other seborrheic keratosis: Secondary | ICD-10-CM | POA: Diagnosis not present

## 2019-07-23 DIAGNOSIS — D2271 Melanocytic nevi of right lower limb, including hip: Secondary | ICD-10-CM | POA: Diagnosis not present

## 2019-07-23 DIAGNOSIS — Z85828 Personal history of other malignant neoplasm of skin: Secondary | ICD-10-CM | POA: Diagnosis not present

## 2019-07-23 DIAGNOSIS — Z09 Encounter for follow-up examination after completed treatment for conditions other than malignant neoplasm: Secondary | ICD-10-CM | POA: Diagnosis not present

## 2019-07-23 DIAGNOSIS — D225 Melanocytic nevi of trunk: Secondary | ICD-10-CM | POA: Diagnosis not present

## 2019-07-23 DIAGNOSIS — L570 Actinic keratosis: Secondary | ICD-10-CM | POA: Diagnosis not present

## 2019-07-23 DIAGNOSIS — D2272 Melanocytic nevi of left lower limb, including hip: Secondary | ICD-10-CM | POA: Diagnosis not present

## 2019-07-23 DIAGNOSIS — X32XXXA Exposure to sunlight, initial encounter: Secondary | ICD-10-CM | POA: Diagnosis not present

## 2019-07-23 DIAGNOSIS — Z872 Personal history of diseases of the skin and subcutaneous tissue: Secondary | ICD-10-CM | POA: Diagnosis not present

## 2019-07-23 DIAGNOSIS — D2262 Melanocytic nevi of left upper limb, including shoulder: Secondary | ICD-10-CM | POA: Diagnosis not present

## 2019-07-23 DIAGNOSIS — D2261 Melanocytic nevi of right upper limb, including shoulder: Secondary | ICD-10-CM | POA: Diagnosis not present

## 2019-07-23 DIAGNOSIS — Z08 Encounter for follow-up examination after completed treatment for malignant neoplasm: Secondary | ICD-10-CM | POA: Diagnosis not present

## 2019-08-13 DIAGNOSIS — M542 Cervicalgia: Secondary | ICD-10-CM | POA: Diagnosis not present

## 2019-08-13 DIAGNOSIS — M7918 Myalgia, other site: Secondary | ICD-10-CM | POA: Diagnosis not present

## 2019-08-13 DIAGNOSIS — M9901 Segmental and somatic dysfunction of cervical region: Secondary | ICD-10-CM | POA: Diagnosis not present

## 2019-08-13 DIAGNOSIS — M5412 Radiculopathy, cervical region: Secondary | ICD-10-CM | POA: Diagnosis not present

## 2019-09-17 NOTE — Progress Notes (Signed)
N/S apt.

## 2019-09-18 ENCOUNTER — Other Ambulatory Visit: Payer: Self-pay

## 2019-09-18 NOTE — Progress Notes (Signed)
This encounter was created in error - please disregard.

## 2019-10-02 ENCOUNTER — Encounter: Payer: Self-pay | Admitting: Podiatry

## 2019-10-02 ENCOUNTER — Ambulatory Visit: Payer: PPO | Admitting: Podiatry

## 2019-10-02 ENCOUNTER — Ambulatory Visit (INDEPENDENT_AMBULATORY_CARE_PROVIDER_SITE_OTHER): Payer: PPO

## 2019-10-02 ENCOUNTER — Other Ambulatory Visit: Payer: Self-pay

## 2019-10-02 DIAGNOSIS — M722 Plantar fascial fibromatosis: Secondary | ICD-10-CM | POA: Diagnosis not present

## 2019-10-05 ENCOUNTER — Encounter: Payer: Self-pay | Admitting: Podiatry

## 2019-10-05 NOTE — Progress Notes (Signed)
Subjective:  Patient ID: Kevin Moore, male    DOB: 1950-05-22,  MRN: [id number]  Chief Complaint  Patient presents with  . Plantar Fasciitis    patient presents today for right heel pain x 3-4 months.  He says its painful in the mornings when first getting up and standing from sitting a while.  He states "it feels like a bruise"   He has tried inserts, icyhot, stretches and Ibuprofen    69 y.o. male presents with the above complaint.  Patient presents with a complaint of right heel pain that has been on for 3 to 4 months.  Patient works for [employer] Is constantly on his foot.  Patient states that it is painful to touch.  He has hard time ambulating given that heel pain is progressing on worse.  He is explaining post static dyskinesia type of symptoms.  Is worse in the morning.  He has not seen anyone else prior to seeing me.  He would like to discuss various treatment options.  He has tried inserts IcyHot stretches and ibuprofen but has not helped.  He denies any other acute complaints   Review of Systems: Negative except as noted in the HPI. Denies N/V/F/Ch.  No past medical history on file.  Current Outpatient Medications:  .  atorvastatin (LIPITOR) 40 MG tablet, TAKE 1TABLET (40 MG TOTAL) BY MOUTH DAILY, Disp: 90 tablet, Rfl: 3 .  clopidogrel (PLAVIX) 75 MG tablet, Take 1 tablet (75 mg total) by mouth daily., Disp: 90 tablet, Rfl: 3 .  ezetimibe (ZETIA) 10 MG tablet, Take 1 tablet (10 mg total) by mouth daily., Disp: 90 tablet, Rfl: 3 .  Magnesium 400 MG CAPS, Take 1 capsule by mouth daily., Disp: , Rfl:  .  omeprazole (PRILOSEC) 20 MG capsule, TAKE ONE (1) CAPSULE EACH DAY, Disp: 90 capsule, Rfl: 3 .  telmisartan-hydrochlorothiazide (MICARDIS HCT) 40-12.5 MG tablet, Take 1 tablet by mouth daily., Disp: 90 tablet, Rfl: 3  Social History   Tobacco Use  Smoking Status Never Smoker  Smokeless Tobacco Never Used    No Known Allergies Objective:  There were no vitals filed for  this visit. There is no height or weight on file to calculate BMI. Constitutional Well developed. Well nourished.  Vascular Dorsalis pedis pulses palpable bilaterally. Posterior tibial pulses palpable bilaterally. Capillary refill normal to all digits.  No cyanosis or clubbing noted. Pedal hair growth normal.  Neurologic Normal speech. Oriented to person, place, and time. Epicritic sensation to light touch grossly present bilaterally.  Dermatologic Nails well groomed and normal in appearance. No open wounds. No skin lesions.  Orthopedic: Normal joint ROM without pain or crepitus bilaterally. No visible deformities. Tender to palpation at the calcaneal tuber right. No pain with calcaneal squeeze right. Ankle ROM diminished range of motion right. Silfverskiold Test: positive right.   Radiographs: Taken and reviewed. No acute fractures or dislocations. No evidence of stress fracture.  Plantar heel spur present. Posterior heel spur present.   Assessment:   1. Plantar fasciitis of right foot    Plan:  Patient was evaluated and treated and all questions answered.  Plantar Fasciitis, right - XR reviewed as above.  - Educated on icing and stretching. Instructions given.  - Injection delivered to the plantar fascia as below. - DME: Plantar Fascial Brace - Pharmacologic management: Meloxicam/Medrol Dose Pak. Educated on risks/benefits and proper taking of medication.  Procedure: Injection Tendon/Ligament Location: Right plantar fascia at the glabrous junction; medial approach. Skin Prep: alcohol Injectate:  0.5 cc 0.5% marcaine plain, 0.5 cc of 1% Lidocaine, 0.5 cc kenalog 10. Disposition: Patient tolerated procedure well. Injection site dressed with a band-aid.  No follow-ups on file.

## 2019-10-21 ENCOUNTER — Encounter: Payer: Self-pay | Admitting: Family Medicine

## 2019-11-03 ENCOUNTER — Encounter: Payer: Self-pay | Admitting: Podiatry

## 2019-11-03 ENCOUNTER — Ambulatory Visit: Payer: PPO | Admitting: Podiatry

## 2019-11-03 ENCOUNTER — Other Ambulatory Visit: Payer: Self-pay

## 2019-11-03 DIAGNOSIS — M722 Plantar fascial fibromatosis: Secondary | ICD-10-CM

## 2019-11-03 NOTE — Progress Notes (Signed)
°  Subjective:  Patient ID: Kevin Moore, male    DOB: [birth year],  MRN: [id number]  Chief Complaint  Patient presents with   Plantar Fasciitis    "its 100 percent better and the brace really helped"    69 y.o. male presents with the above complaint.  Patient presents with a follow-up of right plantar fasciitis.  Patient states he is 100% improved he does not have any further pain.  He has been wearing his brace which has helped a lot.  The injection helped a lot as well.  He denies any other acute complaints.   Review of Systems: Negative except as noted in the HPI. Denies N/V/F/Ch.  No past medical history on file.  Current Outpatient Medications:    atorvastatin (LIPITOR) 40 MG tablet, TAKE 1TABLET (40 MG TOTAL) BY MOUTH DAILY, Disp: 90 tablet, Rfl: 3   clopidogrel (PLAVIX) 75 MG tablet, Take 1 tablet (75 mg total) by mouth daily., Disp: 90 tablet, Rfl: 3   ezetimibe (ZETIA) 10 MG tablet, Take 1 tablet (10 mg total) by mouth daily., Disp: 90 tablet, Rfl: 3   Magnesium 400 MG CAPS, Take 1 capsule by mouth daily., Disp: , Rfl:    omeprazole (PRILOSEC) 20 MG capsule, TAKE ONE (1) CAPSULE EACH DAY, Disp: 90 capsule, Rfl: 3   telmisartan-hydrochlorothiazide (MICARDIS HCT) 40-12.5 MG tablet, Take 1 tablet by mouth daily., Disp: 90 tablet, Rfl: 3  Social History   Tobacco Use  Smoking Status Never Smoker  Smokeless Tobacco Never Used    No Known Allergies Objective:  There were no vitals filed for this visit. There is no height or weight on file to calculate BMI. Constitutional Well developed. Well nourished.  Vascular Dorsalis pedis pulses palpable bilaterally. Posterior tibial pulses palpable bilaterally. Capillary refill normal to all digits.  No cyanosis or clubbing noted. Pedal hair growth normal.  Neurologic Normal speech. Oriented to person, place, and time. Epicritic sensation to light touch grossly present bilaterally.  Dermatologic Nails well groomed and  normal in appearance. No open wounds. No skin lesions.  Orthopedic: Normal joint ROM without pain or crepitus bilaterally. No visible deformities. No tender to palpation at the calcaneal tuber right. No pain with calcaneal squeeze right. Ankle ROM diminished range of motion right. Silfverskiold Test: positive right.   Radiographs: Taken and reviewed. No acute fractures or dislocations. No evidence of stress fracture.  Plantar heel spur present. Posterior heel spur present.   Assessment:   1. Plantar fasciitis of right foot    Plan:  Patient was evaluated and treated and all questions answered.  Plantar Fasciitis, right -Clinically healed.  Patient no longer has any pain at the calcaneal tuber.  I instructed patient to continue wearing the brace and slowly transition out of the brace.  If the plantar fasciitis recurs, we will discuss orthotics management at that time.  Patient states understanding.  No follow-ups on file.

## 2020-03-31 ENCOUNTER — Telehealth: Payer: PPO | Admitting: Family

## 2020-04-01 ENCOUNTER — Other Ambulatory Visit: Payer: Self-pay

## 2020-04-01 ENCOUNTER — Encounter: Payer: Self-pay | Admitting: Family

## 2020-04-01 ENCOUNTER — Telehealth (INDEPENDENT_AMBULATORY_CARE_PROVIDER_SITE_OTHER): Payer: PPO | Admitting: Family

## 2020-04-01 VITALS — BP 150/80 | Ht 74.0 in | Wt 220.0 lb

## 2020-04-01 DIAGNOSIS — E782 Mixed hyperlipidemia: Secondary | ICD-10-CM | POA: Diagnosis not present

## 2020-04-01 DIAGNOSIS — I10 Essential (primary) hypertension: Secondary | ICD-10-CM | POA: Diagnosis not present

## 2020-04-01 NOTE — Assessment & Plan Note (Signed)
Anticipate controlled. Pending lipid panel. Advised trial of coq10 with lipitor 40mg  to see if arthralgia improve. We jointly agreed in setting of h/o MI, CAD, we would not want to stop statin. Continue lipitor 40mg  and zetia 10mg .

## 2020-04-01 NOTE — Assessment & Plan Note (Signed)
Slightly elevated. Discuss goal more consistently 120-130/<80. Suspect mucinex D contributory and counseled on avoiding decongestants as well as daily NSAIDs.  Continue micardis. Close follow up.

## 2020-04-01 NOTE — Progress Notes (Signed)
Virtual Visit via Video Note  I connected with@  on 04/01/20 at  2:30 PM EST by a video enabled telemedicine application and verified that I am speaking with the correct person using two identifiers.  Location patient: home Location provider:work  Persons participating in the virtual visit: patient, provider  I discussed the limitations of evaluation and management by telemedicine and the availability of in person appointments. The patient expressed understanding and agreed to proceed.   HPI: Establish care Feels well today  No complaints   HTN- at home 150/80. Prior to had been 135-140/ 78.  Recently had had a cold and has been  taking mucinex -D past few days.Compliant with micardis 40-12.5mg . No cp, sob   No weight gain or salt indiscretion.  Follows with Dr Danton Sewer appointment 05/2019, CAD , stent placement- compliant with plavix.   Takes aleve prn couple of week for knee pain. Snores from time to time. NO fatigue, HA.   HLD- compliant with lipitor 40mg  and zetia 10mg . Had been on 80mg  lipitor however had arthralgias, Dr decreased to 40mg  and symptom improved however not resolved.    ROS: See pertinent positives and negatives per HPI.    EXAM:  VITALS per patient if applicable: BP (!) 150/80   Ht 6\' 2"  (1.88 m)   Wt 220 lb (99.8 kg)   BMI 28.25 kg/m  BP Readings from Last 3 Encounters:  04/01/20 (!) 150/80  06/10/19 (!) 148/96  10/17/18 130/68   Wt Readings from Last 3 Encounters:  04/01/20 220 lb (99.8 kg)  06/10/19 223 lb 8 oz (101.4 kg)  10/17/18 224 lb (101.6 kg)    GENERAL: alert, oriented, appears well and in no acute distress  HEENT: atraumatic, conjunttiva clear, no obvious abnormalities on inspection of external nose and ears  NECK: normal movements of the head and neck  LUNGS: on inspection no signs of respiratory distress, breathing rate appears normal, no obvious gross SOB, gasping or wheezing  CV: no obvious cyanosis  MS: moves  all visible extremities without noticeable abnormality  PSYCH/NEURO: pleasant and cooperative, no obvious depression or anxiety, speech and thought processing grossly intact  ASSESSMENT AND PLAN:  Discussed the following assessment and plan:  Problem List Items Addressed This Visit      Cardiovascular and Mediastinum   Essential (primary) hypertension - Primary    Slightly elevated. Discuss goal more consistently 120-130/<80. Suspect mucinex D contributory and counseled on avoiding decongestants as well as daily NSAIDs.  Continue micardis. Close follow up.       Relevant Orders   CBC with Differential/Platelet   Comprehensive metabolic panel   Hemoglobin A1c   Lipid panel   TSH   VITAMIN D 25 Hydroxy (Vit-D Deficiency, Fractures)     Other   HLD (hyperlipidemia)    Anticipate controlled. Pending lipid panel. Advised trial of coq10 with lipitor 40mg  to see if arthralgia improve. We jointly agreed in setting of h/o MI, CAD, we would not want to stop statin. Continue lipitor 40mg  and zetia 10mg .          -we discussed possible serious and likely etiologies, options for evaluation and workup, limitations of telemedicine visit vs in person visit, treatment, treatment risks and precautions. Pt prefers to treat via telemedicine empirically rather then risking or undertaking an in person visit at this moment.  .   I discussed the assessment and treatment plan with the patient. The patient was provided an opportunity to ask questions and all were  answered. The patient agreed with the plan and demonstrated an understanding of the instructions.   The patient was advised to call back or seek an in-person evaluation if the symptoms worsen or if the condition fails to improve as anticipated.   Mable Paris, FNP

## 2020-05-27 DIAGNOSIS — D2261 Melanocytic nevi of right upper limb, including shoulder: Secondary | ICD-10-CM | POA: Diagnosis not present

## 2020-05-27 DIAGNOSIS — D2271 Melanocytic nevi of right lower limb, including hip: Secondary | ICD-10-CM | POA: Diagnosis not present

## 2020-05-27 DIAGNOSIS — D2262 Melanocytic nevi of left upper limb, including shoulder: Secondary | ICD-10-CM | POA: Diagnosis not present

## 2020-05-27 DIAGNOSIS — L821 Other seborrheic keratosis: Secondary | ICD-10-CM | POA: Diagnosis not present

## 2020-05-27 DIAGNOSIS — Z85828 Personal history of other malignant neoplasm of skin: Secondary | ICD-10-CM | POA: Diagnosis not present

## 2020-05-27 DIAGNOSIS — D485 Neoplasm of uncertain behavior of skin: Secondary | ICD-10-CM | POA: Diagnosis not present

## 2020-05-27 DIAGNOSIS — L570 Actinic keratosis: Secondary | ICD-10-CM | POA: Diagnosis not present

## 2020-05-27 DIAGNOSIS — B360 Pityriasis versicolor: Secondary | ICD-10-CM | POA: Diagnosis not present

## 2020-05-27 DIAGNOSIS — X32XXXA Exposure to sunlight, initial encounter: Secondary | ICD-10-CM | POA: Diagnosis not present

## 2020-05-27 DIAGNOSIS — L99 Other disorders of skin and subcutaneous tissue in diseases classified elsewhere: Secondary | ICD-10-CM | POA: Diagnosis not present

## 2020-06-11 ENCOUNTER — Other Ambulatory Visit (INDEPENDENT_AMBULATORY_CARE_PROVIDER_SITE_OTHER): Payer: PPO

## 2020-06-11 ENCOUNTER — Other Ambulatory Visit: Payer: Self-pay

## 2020-06-11 DIAGNOSIS — I10 Essential (primary) hypertension: Secondary | ICD-10-CM

## 2020-06-11 LAB — CBC WITH DIFFERENTIAL/PLATELET
Basophils Absolute: 0 10*3/uL (ref 0.0–0.1)
Basophils Relative: 0.7 % (ref 0.0–3.0)
Eosinophils Absolute: 0.1 10*3/uL (ref 0.0–0.7)
Eosinophils Relative: 1.5 % (ref 0.0–5.0)
HCT: 46 % (ref 39.0–52.0)
Hemoglobin: 15.6 g/dL (ref 13.0–17.0)
Lymphocytes Relative: 36.7 % (ref 12.0–46.0)
Lymphs Abs: 2.2 10*3/uL (ref 0.7–4.0)
MCHC: 33.8 g/dL (ref 30.0–36.0)
MCV: 89.9 fl (ref 78.0–100.0)
Monocytes Absolute: 0.6 10*3/uL (ref 0.1–1.0)
Monocytes Relative: 9.8 % (ref 3.0–12.0)
Neutro Abs: 3.1 10*3/uL (ref 1.4–7.7)
Neutrophils Relative %: 51.3 % (ref 43.0–77.0)
Platelets: 205 10*3/uL (ref 150.0–400.0)
RBC: 5.12 Mil/uL (ref 4.22–5.81)
RDW: 13 % (ref 11.5–15.5)
WBC: 6.1 10*3/uL (ref 4.0–10.5)

## 2020-06-11 LAB — LIPID PANEL
Cholesterol: 156 mg/dL (ref 0–200)
HDL: 44.5 mg/dL (ref >39.00)
LDL Cholesterol: 95 mg/dL (ref 0–99)
NonHDL: 111.87
Total CHOL/HDL Ratio: 4
Triglycerides: 85 mg/dL (ref 0.0–149.0)
VLDL: 17 mg/dL (ref 0.0–40.0)

## 2020-06-11 LAB — COMPREHENSIVE METABOLIC PANEL
ALT: 22 U/L (ref 0–53)
AST: 17 U/L (ref 0–37)
Albumin: 4.1 g/dL (ref 3.5–5.2)
Alkaline Phosphatase: 118 U/L — ABNORMAL HIGH (ref 39–117)
BUN: 18 mg/dL (ref 6–23)
CO2: 29 mEq/L (ref 19–32)
Calcium: 9.4 mg/dL (ref 8.4–10.5)
Chloride: 104 mEq/L (ref 96–112)
Creatinine, Ser: 0.92 mg/dL (ref 0.40–1.50)
GFR: 85.81 mL/min (ref >60.00)
Glucose, Bld: 103 mg/dL — ABNORMAL HIGH (ref 70–99)
Potassium: 4.2 mEq/L (ref 3.5–5.1)
Sodium: 142 mEq/L (ref 135–145)
Total Bilirubin: 1 mg/dL (ref 0.2–1.2)
Total Protein: 6.5 g/dL (ref 6.0–8.3)

## 2020-06-11 LAB — VITAMIN D 25 HYDROXY (VIT D DEFICIENCY, FRACTURES): VITD: 26.94 ng/mL — ABNORMAL LOW (ref 30.00–100.00)

## 2020-06-11 LAB — TSH: TSH: 1.31 u[IU]/mL (ref 0.35–4.50)

## 2020-06-11 LAB — HEMOGLOBIN A1C: Hgb A1c MFr Bld: 5.6 % (ref 4.6–6.5)

## 2020-06-14 ENCOUNTER — Ambulatory Visit (INDEPENDENT_AMBULATORY_CARE_PROVIDER_SITE_OTHER): Payer: PPO | Admitting: Family

## 2020-06-14 ENCOUNTER — Other Ambulatory Visit: Payer: Self-pay | Admitting: Family

## 2020-06-14 ENCOUNTER — Encounter: Payer: Self-pay | Admitting: Family

## 2020-06-14 ENCOUNTER — Other Ambulatory Visit: Payer: Self-pay

## 2020-06-14 VITALS — BP 124/84 | HR 64 | Temp 97.8°F | Ht 73.5 in | Wt 221.2 lb

## 2020-06-14 DIAGNOSIS — R748 Abnormal levels of other serum enzymes: Secondary | ICD-10-CM

## 2020-06-14 DIAGNOSIS — Z125 Encounter for screening for malignant neoplasm of prostate: Secondary | ICD-10-CM

## 2020-06-14 DIAGNOSIS — Z0000 Encounter for general adult medical examination without abnormal findings: Secondary | ICD-10-CM

## 2020-06-14 DIAGNOSIS — Z23 Encounter for immunization: Secondary | ICD-10-CM | POA: Diagnosis not present

## 2020-06-14 NOTE — Patient Instructions (Signed)
Nice to see you!   Health Maintenance, Male Adopting a healthy lifestyle and getting preventive care are important in promoting health and wellness. Ask your health care provider about:  The right schedule for you to have regular tests and exams.  Things you can do on your own to prevent diseases and keep yourself healthy. What should I know about diet, weight, and exercise? Eat a healthy diet  Eat a diet that includes plenty of vegetables, fruits, low-fat dairy products, and lean protein.  Do not eat a lot of foods that are high in solid fats, added sugars, or sodium.   Maintain a healthy weight Body mass index (BMI) is a measurement that can be used to identify possible weight problems. It estimates body fat based on height and weight. Your health care provider can help determine your BMI and help you achieve or maintain a healthy weight. Get regular exercise Get regular exercise. This is one of the most important things you can do for your health. Most adults should:  Exercise for at least 150 minutes each week. The exercise should increase your heart rate and make you sweat (moderate-intensity exercise).  Do strengthening exercises at least twice a week. This is in addition to the moderate-intensity exercise.  Spend less time sitting. Even light physical activity can be beneficial. Watch cholesterol and blood lipids Have your blood tested for lipids and cholesterol at 68 years of age, then have this test every 5 years. You may need to have your cholesterol levels checked more often if:  Your lipid or cholesterol levels are high.  You are older than 68 years of age.  You are at high risk for heart disease. What should I know about cancer screening? Many types of cancers can be detected early and may often be prevented. Depending on your health history and family history, you may need to have cancer screening at various ages. This may include screening for:  Colorectal  cancer.  Prostate cancer.  Skin cancer.  Lung cancer. What should I know about heart disease, diabetes, and high blood pressure? Blood pressure and heart disease  High blood pressure causes heart disease and increases the risk of stroke. This is more likely to develop in people who have high blood pressure readings, are of African descent, or are overweight.  Talk with your health care provider about your target blood pressure readings.  Have your blood pressure checked: ? Every 3-5 years if you are 16-71 years of age. ? Every year if you are 60 years old or older.  If you are between the ages of 84 and 24 and are a current or former smoker, ask your health care provider if you should have a one-time screening for abdominal aortic aneurysm (AAA). Diabetes Have regular diabetes screenings. This checks your fasting blood sugar level. Have the screening done:  Once every three years after age 75 if you are at a normal weight and have a low risk for diabetes.  More often and at a younger age if you are overweight or have a high risk for diabetes. What should I know about preventing infection? Hepatitis B If you have a higher risk for hepatitis B, you should be screened for this virus. Talk with your health care provider to find out if you are at risk for hepatitis B infection. Hepatitis C Blood testing is recommended for:  Everyone born from [birth year] through 1963.  Anyone with known risk factors for hepatitis C. Sexually transmitted infections (STIs)  You should be screened each year for STIs, including gonorrhea and chlamydia, if: ? You are sexually active and are younger than 68 years of age. ? You are older than 68 years of age and your health care provider tells you that you are at risk for this type of infection. ? Your sexual activity has changed since you were last screened, and you are at increased risk for chlamydia or gonorrhea. Ask your health care provider if you are at  risk.  Ask your health care provider about whether you are at high risk for HIV. Your health care provider may recommend a prescription medicine to help prevent HIV infection. If you choose to take medicine to prevent HIV, you should first get tested for HIV. You should then be tested every 3 months for as long as you are taking the medicine. Follow these instructions at home: Lifestyle  Do not use any products that contain nicotine or tobacco, such as cigarettes, e-cigarettes, and chewing tobacco. If you need help quitting, ask your health care provider.  Do not use street drugs.  Do not share needles.  Ask your health care provider for help if you need support or information about quitting drugs. Alcohol use  Do not drink alcohol if your health care provider tells you not to drink.  If you drink alcohol: ? Limit how much you have to 0-2 drinks a day. ? Be aware of how much alcohol is in your drink. In the [place] one drink equals one 12 oz bottle of beer (355 mL), one 5 oz glass of wine (148 mL), or one 1 oz glass of hard liquor (44 mL). General instructions  Schedule regular health, dental, and eye exams.  Stay current with your vaccines.  Tell your health care provider if: ? You often feel depressed. ? You have ever been abused or do not feel safe at home. Summary  Adopting a healthy lifestyle and getting preventive care are important in promoting health and wellness.  Follow your health care provider's instructions about healthy diet, exercising, and getting tested or screened for diseases.  Follow your health care provider's instructions on monitoring your cholesterol and blood pressure. This information is not intended to replace advice given to you by your health care provider. Make sure you discuss any questions you have with your health care provider. Document Revised: 03/06/2018 Document Reviewed: 03/06/2018 Elsevier Patient Education  2021 [employer].

## 2020-06-14 NOTE — Progress Notes (Signed)
Subjective:    Patient ID: Kevin Moore, male    DOB: March 20, 1951, 70 y.o.   MRN: [id number]  CC: Kevin Moore is a 70 y.o. male who presents today for physical exam.    HPI: Feels well today No complaints   HTN- compliant with micardis hct 40-12.5mg .No cp, sob.   H/o MI- compliant with plavix. He continues to follow with Dr Rockey Situ.   HLD- compliant with zetia 10mg  , lipitor 40mg      Colorectal  Cancer Screening: due.  Prostate Cancer Screening:No urinary hesitancy, decreased urine stream.   Lung Cancer Screening: No 30 year pack year history and > 50 years to 80 years  Immunizations       Tetanus - UTD        Pneumococcal - Candidate for PCV20  Labs: Screening labs today ordered prior except for psa.  Exercise: No formal exercise; physical job and works in yard. 6000-7000 steps per day.  Alcohol use:  occassional Smoking/tobacco use: Nonsmoker.     HISTORY:  History reviewed. No pertinent past medical history.  Past Surgical History:  Procedure Laterality Date  . COLON SURGERY     partial removal of colon with perforation due to suspected fish bone.  . CORONARY ANGIOPLASTY WITH STENT PLACEMENT  2016   Stent placed to RCA 2, left circumflex 1 ; last [facility]  . KNEE ARTHROSCOPY Left   . TONSILLECTOMY     Family History  Problem Relation Age of Onset  . Hyperlipidemia Mother   . Cancer Mother        Ovarian, died age 8  . Heart disease Father        bypass at age 68  . Epilepsy Son       ALLERGIES: Patient has no known allergies.  Current Outpatient Medications on File Prior to Visit  Medication Sig Dispense Refill  . atorvastatin (LIPITOR) 40 MG tablet TAKE 1TABLET (40 MG TOTAL) BY MOUTH DAILY 90 tablet 3  . clopidogrel (PLAVIX) 75 MG tablet Take 1 tablet (75 mg total) by mouth daily. 90 tablet 3  . ezetimibe (ZETIA) 10 MG tablet Take 1 tablet (10 mg total) by mouth daily. 90 tablet 3  . ketoconazole (NIZORAL) 2 % shampoo Apply 1  application topically 2 (two) times a week.    . Magnesium 250 MG TABS Take 1 tablet by mouth daily.    Marland Kitchen omeprazole (PRILOSEC) 20 MG capsule TAKE ONE (1) CAPSULE EACH DAY 90 capsule 3  . telmisartan-hydrochlorothiazide (MICARDIS HCT) 40-12.5 MG tablet Take 1 tablet by mouth daily. 90 tablet 3   No current facility-administered medications on file prior to visit.    Social History   Tobacco Use  . Smoking status: Never Smoker  . Smokeless tobacco: Never Used  Substance Use Topics  . Alcohol use: Yes    Alcohol/week: 3.0 standard drinks    Types: 3 Cans of beer per week    Comment: occassional beer  . Drug use: No    Review of Systems  Constitutional: Negative for chills and fever.  HENT: Negative for congestion.   Respiratory: Negative for cough.   Cardiovascular: Negative for chest pain, palpitations and leg swelling.  Gastrointestinal: Negative for diarrhea, nausea and vomiting.  Genitourinary: Negative for difficulty urinating and urgency.  Musculoskeletal: Negative for myalgias.  Skin: Negative for rash.  Neurological: Negative for headaches.  Hematological: Negative for adenopathy.  Psychiatric/Behavioral: Negative for confusion.      Objective:    BP 124/84  Pulse 64   Temp 97.8 F (36.6 C)   Ht 6' 1.5" (1.867 m)   Wt 221 lb 3.2 oz (100.3 kg)   SpO2 97%   BMI 28.79 kg/m   BP Readings from Last 3 Encounters:  06/14/20 124/84  04/01/20 (!) 150/80  06/10/19 (!) 148/96   Wt Readings from Last 3 Encounters:  06/14/20 221 lb 3.2 oz (100.3 kg)  04/01/20 220 lb (99.8 kg)  06/10/19 223 lb 8 oz (101.4 kg)    Physical Exam Vitals reviewed.  Constitutional:      Appearance: He is well-developed.  Neck:     Thyroid: No thyroid mass or thyromegaly.  Cardiovascular:     Rate and Rhythm: Regular rhythm.     Heart sounds: Normal heart sounds.  Pulmonary:     Effort: Pulmonary effort is normal. No respiratory distress.     Breath sounds: Normal breath sounds.  No wheezing, rhonchi or rales.  Lymphadenopathy:     Head:     Right side of head: No submental, submandibular, tonsillar, preauricular, posterior auricular or occipital adenopathy.     Left side of head: No submental, submandibular, tonsillar, preauricular, posterior auricular or occipital adenopathy.     Cervical: No cervical adenopathy.  Skin:    General: Skin is warm and dry.  Neurological:     Mental Status: He is alert.  Psychiatric:        Speech: Speech normal.        Behavior: Behavior normal.        Assessment & Plan:   Problem List Items Addressed This Visit      Other   Routine physical examination - Primary    Advised continued exercise. PSA ordered. Referral for colonscopy      Relevant Orders   PSA   Ambulatory referral to Gastroenterology    Other Visit Diagnoses    Elevated alkaline phosphatase level           I am having Kevin Moore maintain his atorvastatin, clopidogrel, omeprazole, ezetimibe, telmisartan-hydrochlorothiazide, Magnesium, and ketoconazole.   No orders of the defined types were placed in this encounter.   Return precautions given.   Risks, benefits, and alternatives of the medications and treatment plan prescribed today were discussed, and patient expressed understanding.   Education regarding symptom management and diagnosis given to patient on AVS.   Continue to follow with Kevin Hawthorne, Kevin Moore for routine health maintenance.   Kevin Moore and I agreed with plan.   Kevin Paris, Kevin Moore

## 2020-06-14 NOTE — Addendum Note (Signed)
Addended by: Earlyne Iba on: 06/14/2020 04:57 PM   Modules accepted: Orders

## 2020-06-14 NOTE — Assessment & Plan Note (Signed)
Advised continued exercise. PSA ordered. Referral for colonscopy

## 2020-06-15 LAB — GAMMA GT: GGT: 28 U/L (ref 7–51)

## 2020-06-15 LAB — PSA: PSA: 6.92 ng/mL — ABNORMAL HIGH (ref 0.10–4.00)

## 2020-06-16 ENCOUNTER — Encounter: Payer: Self-pay | Admitting: Family

## 2020-06-16 ENCOUNTER — Other Ambulatory Visit: Payer: Self-pay | Admitting: Family

## 2020-06-16 ENCOUNTER — Telehealth: Payer: Self-pay

## 2020-06-16 DIAGNOSIS — R748 Abnormal levels of other serum enzymes: Secondary | ICD-10-CM

## 2020-06-16 LAB — ALKALINE PHOSPHATASE, ISOENZYMES
Alkaline Phosphatase: 138 IU/L — ABNORMAL HIGH (ref 44–121)
BONE FRACTION: 15 % (ref 12–68)
INTESTINAL FRAC.: 8 % (ref 0–18)
LIVER FRACTION: 77 % (ref 13–88)

## 2020-06-16 NOTE — Telephone Encounter (Signed)
LMTCB for lab results.  

## 2020-06-21 ENCOUNTER — Other Ambulatory Visit: Payer: Self-pay | Admitting: Cardiovascular Disease

## 2020-06-21 NOTE — Telephone Encounter (Signed)
Rx request sent to pharmacy.  

## 2020-06-22 ENCOUNTER — Other Ambulatory Visit: Payer: Self-pay | Admitting: Family

## 2020-06-22 DIAGNOSIS — R748 Abnormal levels of other serum enzymes: Secondary | ICD-10-CM

## 2020-06-22 DIAGNOSIS — R972 Elevated prostate specific antigen [PSA]: Secondary | ICD-10-CM

## 2020-06-23 ENCOUNTER — Other Ambulatory Visit: Payer: Self-pay | Admitting: Family

## 2020-06-23 ENCOUNTER — Telehealth: Payer: Self-pay | Admitting: Family

## 2020-06-23 DIAGNOSIS — I10 Essential (primary) hypertension: Secondary | ICD-10-CM

## 2020-06-23 NOTE — Telephone Encounter (Signed)
lft vm for pt to call ofc to sch US abdomen.thanks 

## 2020-07-01 ENCOUNTER — Other Ambulatory Visit: Payer: Self-pay

## 2020-07-01 ENCOUNTER — Other Ambulatory Visit (INDEPENDENT_AMBULATORY_CARE_PROVIDER_SITE_OTHER): Payer: PPO

## 2020-07-01 DIAGNOSIS — I10 Essential (primary) hypertension: Secondary | ICD-10-CM

## 2020-07-01 LAB — COMPREHENSIVE METABOLIC PANEL
ALT: 22 U/L (ref 0–53)
AST: 17 U/L (ref 0–37)
Albumin: 3.8 g/dL (ref 3.5–5.2)
Alkaline Phosphatase: 111 U/L (ref 39–117)
BUN: 13 mg/dL (ref 6–23)
CO2: 31 mEq/L (ref 19–32)
Calcium: 9 mg/dL (ref 8.4–10.5)
Chloride: 105 mEq/L (ref 96–112)
Creatinine, Ser: 0.92 mg/dL (ref 0.40–1.50)
GFR: 85.78 mL/min (ref >60.00)
Glucose, Bld: 98 mg/dL (ref 70–99)
Potassium: 3.9 mEq/L (ref 3.5–5.1)
Sodium: 142 mEq/L (ref 135–145)
Total Bilirubin: 0.7 mg/dL (ref 0.2–1.2)
Total Protein: 5.9 g/dL — ABNORMAL LOW (ref 6.0–8.3)

## 2020-07-01 LAB — PSA: PSA: 7.42 ng/mL — ABNORMAL HIGH (ref 0.10–4.00)

## 2020-07-07 ENCOUNTER — Ambulatory Visit: Payer: PPO | Admitting: Urology

## 2020-07-07 ENCOUNTER — Other Ambulatory Visit: Payer: Self-pay

## 2020-07-07 ENCOUNTER — Encounter: Payer: Self-pay | Admitting: Urology

## 2020-07-07 VITALS — BP 187/107 | HR 59 | Ht 74.0 in | Wt 220.0 lb

## 2020-07-07 DIAGNOSIS — R972 Elevated prostate specific antigen [PSA]: Secondary | ICD-10-CM

## 2020-07-07 DIAGNOSIS — N401 Enlarged prostate with lower urinary tract symptoms: Secondary | ICD-10-CM

## 2020-07-07 MED ORDER — TAMSULOSIN HCL 0.4 MG PO CAPS: 0.4000 mg | ORAL_CAPSULE | Freq: Every day | ORAL | 0 refills | Status: DC

## 2020-07-07 NOTE — Progress Notes (Signed)
07/07/2020 8:37 AM   [name] 1950-08-31 [id number]  Referring provider: Burnard Hawthorne, FNP [address] [place],  [place] [address]  Chief Complaint  Patient presents with  . Elevated PSA    HPI: Kevin Moore is a 70 y.o. male referred for evaluation of an elevated PSA.   PSA drawn 06/14/2020 was elevated at 6.92 and repeat PSA 07/01/2020 persistently elevated at 7.42      + Urinary urgency  No dysuria, gross hematuria  No flank, abdominal or pelvic pain  No family history prostate cancer or prior urologic problems   PMH: History reviewed. No pertinent past medical history.  Surgical History: Past Surgical History:  Procedure Laterality Date  . COLON SURGERY     partial removal of colon with perforation due to suspected fish bone.  . CORONARY ANGIOPLASTY WITH STENT PLACEMENT  2016   Stent placed to RCA 2, left circumflex 1 ; last [facility]  . KNEE ARTHROSCOPY Left   . TONSILLECTOMY      Home Medications:  Allergies as of 07/07/2020   No Known Allergies     Medication List       Accurate as of July 07, 2020  8:37 AM. If you have any questions, ask your nurse or doctor.        atorvastatin 40 MG tablet Commonly known as: LIPITOR TAKE 1TABLET (40 MG TOTAL) BY MOUTH DAILY   clopidogrel 75 MG tablet Commonly known as: PLAVIX Take 1 tablet (75 mg total) by mouth daily. Please keep your upcoming appointment for 90 day refills.   ezetimibe 10 MG tablet Commonly known as: ZETIA Take 1 tablet (10 mg total) by mouth daily. Please keep your upcoming appointment for 90 day refills.   ketoconazole 2 % shampoo Commonly known as: NIZORAL Apply 1 application topically 2 (two) times a week.   Magnesium 250 MG Tabs Take 1 tablet by mouth daily.   omeprazole 20 MG capsule Commonly known as: PRILOSEC Take 1 capsule (20 mg total) by mouth daily. Please keep your upcoming appointment for 90 day refills.    telmisartan-hydrochlorothiazide 40-12.5 MG tablet Commonly known as: Micardis HCT Take 1 tablet by mouth daily.       Allergies: No Known Allergies  Family History: Family History  Problem Relation Age of Onset  . Hyperlipidemia Mother   . Cancer Mother        Ovarian, died age 19  . Heart disease Father        bypass at age 18  . Epilepsy Son     Social History:  reports that he has never smoked. He has never used smokeless tobacco. He reports current alcohol use of about 3.0 standard drinks of alcohol per week. He reports that he does not use drugs.   Physical Exam: BP (!) 187/107   Pulse (!) 59   Ht 6\' 2"  (1.88 m)   Wt 220 lb (99.8 kg)   BMI 28.25 kg/m   Constitutional:  Alert and oriented, No acute distress. HEENT: [place] AT, moist mucus membranes.  Trachea midline, no masses. Cardiovascular: No clubbing, cyanosis, or edema. Respiratory: Normal respiratory effort, no increased work of breathing. GU: Prostate 60 g, smooth without nodules or induration Skin: No rashes, bruises or suspicious lesions. Neurologic: Grossly intact, no focal deficits, moving all 4 extremities. Psychiatric: Normal mood and affect.   Assessment & Plan:    1.  Elevated PSA  Benign DRE  Although PSA is a prostate cancer screening  test he was informed that cancer is not the most common cause of an elevated PSA. Other potential causes including BPH and inflammation were discussed. He was informed that the only way to adequately diagnose prostate cancer would be a transrectal ultrasound and biopsy of the prostate. The procedure was discussed including potential risks of bleeding and infection/sepsis. He was also informed that a negative biopsy does not conclusively rule out the possibility that prostate cancer may be present and that continued monitoring is required. The use of newer adjunctive blood tests including PHI and 4kScore were discussed. The use of multiparametric prostate MRI to identify  lesions suspicious for high-grade prostate cancer was discussed as well as continued periodic surveillance.  Urinalysis today  Rx tamsulosin 0.4 mg daily x30 days with a repeat PSA in 1 month  He will think over the above options if his PSA remains persistently elevated  2.  BPH with urinary urgency  As above   Abbie Sons, MD  [facility] [address] [place], [place] [address] [phone]

## 2020-07-08 LAB — URINALYSIS, COMPLETE
Bilirubin, UA: NEGATIVE
Glucose, UA: NEGATIVE
Ketones, UA: NEGATIVE
Leukocytes,UA: NEGATIVE
Nitrite, UA: NEGATIVE
Protein,UA: NEGATIVE
RBC, UA: NEGATIVE
Specific Gravity, UA: 1.015 (ref 1.005–1.030)
Urobilinogen, Ur: 0.2 mg/dL (ref 0.2–1.0)
pH, UA: 7 (ref 5.0–7.5)

## 2020-07-08 LAB — MICROSCOPIC EXAMINATION
Bacteria, UA: NONE SEEN
RBC, Urine: NONE SEEN /hpf (ref 0–2)

## 2020-07-13 ENCOUNTER — Other Ambulatory Visit: Payer: Self-pay | Admitting: *Deleted

## 2020-07-13 DIAGNOSIS — R972 Elevated prostate specific antigen [PSA]: Secondary | ICD-10-CM

## 2020-07-15 ENCOUNTER — Ambulatory Visit (INDEPENDENT_AMBULATORY_CARE_PROVIDER_SITE_OTHER): Payer: PPO

## 2020-07-15 VITALS — Ht 74.0 in | Wt 220.0 lb

## 2020-07-15 DIAGNOSIS — Z0000 Encounter for general adult medical examination without abnormal findings: Secondary | ICD-10-CM

## 2020-07-15 NOTE — Progress Notes (Signed)
Subjective:   Kevin Moore is a 70 y.o. male who presents for an Initial Medicare Annual Wellness Visit.  Review of Systems    No ROS.  Medicare Wellness Virtual Visit.    Cardiac Risk Factors include: advanced age (>36men, >59 women);male gender;hypertension     Objective:    Today's Vitals   07/15/20 0822  Weight: 220 lb (99.8 kg)  Height: 6\' 2"  (1.88 m)   Body mass index is 28.25 kg/m.  Advanced Directives 07/15/2020 12/01/2014 08/26/2014  Does Patient Have a Medical Advance Directive? Yes Yes Yes  Type of Advance Directive - [place] will  Does patient want to make changes to medical advance directive? No - Patient declined No - Patient declined -  Copy of [place] in Chart? - Yes No - copy requested    Current Medications (verified) Outpatient Encounter Medications as of 07/15/2020  Medication Sig  . atorvastatin (LIPITOR) 40 MG tablet TAKE 1TABLET (40 MG TOTAL) BY MOUTH DAILY  . clopidogrel (PLAVIX) 75 MG tablet Take 1 tablet (75 mg total) by mouth daily. Please keep your upcoming appointment for 90 day refills.  . ezetimibe (ZETIA) 10 MG tablet Take 1 tablet (10 mg total) by mouth daily. Please keep your upcoming appointment for 90 day refills.  Marland Kitchen ketoconazole (NIZORAL) 2 % shampoo Apply 1 application topically 2 (two) times a week.  . Magnesium 250 MG TABS Take 1 tablet by mouth daily.  Marland Kitchen omeprazole (PRILOSEC) 20 MG capsule Take 1 capsule (20 mg total) by mouth daily. Please keep your upcoming appointment for 90 day refills.  . tamsulosin (FLOMAX) 0.4 MG CAPS capsule Take 1 capsule (0.4 mg total) by mouth daily.  Marland Kitchen telmisartan-hydrochlorothiazide (MICARDIS HCT) 40-12.5 MG tablet Take 1 tablet by mouth daily.   No facility-administered encounter medications on file as of 07/15/2020.    Allergies (verified) Patient has no known allergies.   History: History reviewed. No pertinent past medical history. Past  Surgical History:  Procedure Laterality Date  . COLON SURGERY     partial removal of colon with perforation due to suspected fish bone.  . CORONARY ANGIOPLASTY WITH STENT PLACEMENT  2016   Stent placed to RCA 2, left circumflex 1 ; last [facility]  . KNEE ARTHROSCOPY Left   . TONSILLECTOMY     Family History  Problem Relation Age of Onset  . Hyperlipidemia Mother   . Cancer Mother        Ovarian, died age 38  . Heart disease Father        bypass at age 68  . Protein C deficiency Brother   . Epilepsy Son    Social History   Socioeconomic History  . Marital status: Unknown    Spouse name: Not on file  . Number of children: Not on file  . Years of education: Not on file  . Highest education level: Not on file  Occupational History  . Not on file  Tobacco Use  . Smoking status: Never Smoker  . Smokeless tobacco: Never Used  Substance and Sexual Activity  . Alcohol use: Yes    Alcohol/week: 3.0 standard drinks    Types: 3 Cans of beer per week    Comment: occassional beer  . Drug use: No  . Sexual activity: Yes  Other Topics Concern  . Not on file  Social History Narrative  . Not on file   Social Determinants of Health   Financial Resource Strain: Low  Risk   . Difficulty of Paying Living Expenses: Not hard at all  Food Insecurity: No Food Insecurity  . Worried About Charity fundraiser in the Last Year: Never true  . Ran Out of Food in the Last Year: Never true  Transportation Needs: No Transportation Needs  . Lack of Transportation (Medical): No  . Lack of Transportation (Non-Medical): No  Physical Activity: Sufficiently Active  . Days of Exercise per Week: 5 days  . Minutes of Exercise per Session: 30 min  Stress: No Stress Concern Present  . Feeling of Stress : Not at all  Social Connections: Unknown  . Frequency of Communication with Friends and Family: More than three times a week  . Frequency of Social Gatherings with Friends and Family: More than three times  a week  . Attends Religious Services: Not on file  . Active Member of Clubs or Organizations: Not on file  . Attends Archivist Meetings: Not on file  . Marital Status: Not on file    Tobacco Counseling Counseling given: Not Answered   Clinical Intake:  Pre-visit preparation completed: Yes        Diabetes: No  How often do you need to have someone help you when you read instructions, pamphlets, or other written materials from your doctor or pharmacy?: 1 - Never   Interpreter Needed?: No      Activities of Daily Living In your present state of health, do you have any difficulty performing the following activities: 07/15/2020  Hearing? N  Vision? N  Difficulty concentrating or making decisions? N  Walking or climbing stairs? N  Dressing or bathing? N  Doing errands, shopping? N  Preparing Food and eating ? N  Using the Toilet? N  In the past six months, have you accidently leaked urine? N  Do you have problems with loss of bowel control? N  Managing your Medications? N  Managing your Finances? N  Housekeeping or managing your Housekeeping? N  Some recent data might be hidden    Patient Care Team: Burnard Hawthorne, FNP as PCP - General (Family Medicine)  Indicate any recent Medical Services you may have received from other than [facility] providers in the past year (date may be approximate).     Assessment:   This is a routine wellness examination for [facility].  I connected with Son today by telephone and verified that I am speaking with the correct person using two identifiers. Location patient: home Location provider: work Persons participating in the virtual visit: patient, Marine scientist.    I discussed the limitations, risks, security and privacy concerns of performing an evaluation and management service by telephone and the availability of in person appointments. The patient expressed understanding and verbally consented to this telephonic visit.     Interactive audio and video telecommunications were attempted between this provider and patient, however failed, due to patient having technical difficulties OR patient did not have access to video capability.  We continued and completed visit with audio only.  Some vital signs may be absent or patient reported.   Hearing/Vision screen  Hearing Screening   125Hz  250Hz  500Hz  1000Hz  2000Hz  3000Hz  4000Hz  6000Hz  8000Hz   Right ear:           Left ear:           Comments: Patient is able to hear conversational tones without difficulty.  No issues reported.  Vision Screening Comments: Followed by [facility] Wears corrective lenses Visual acuity not  assessed, virtual visit.  They have seen their ophthalmologist in the last 12 months.     Dietary issues and exercise activities discussed: Current Exercise Habits: Home exercise routine, Type of exercise: walking, Intensity: Mild  Low salt diet/1200mg  restriction Good fluid intake  Goals    . Maintain Healthy Lifestyle     Stay active Healthy diet      Depression Screen PHQ 2/9 Scores 07/15/2020 06/14/2020 04/01/2020 10/17/2018 01/21/2015 12/01/2014  PHQ - 2 Score 0 0 0 0 0 0  PHQ- 9 Score - - - - 0 0    Fall Risk Fall Risk  07/15/2020 06/14/2020 10/17/2018 12/01/2014  Falls in the past year? 0 0 0 No  Number falls in past yr: 0 - - -  Injury with Fall? 0 - - -  Follow up Falls evaluation completed Falls evaluation completed Falls evaluation completed -    FALL RISK PREVENTION PERTAINING TO THE HOME: Handrails in use when climbing stairs? Yes Home free of loose throw rugs in walkways, pet beds, electrical cords, etc? Yes  Adequate lighting in your home to reduce risk of falls? Yes   ASSISTIVE DEVICES UTILIZED TO PREVENT FALLS: Life alert? No  Use of a cane, walker or w/c? No   TIMED UP AND GO: Was the test performed? No . Virtual visit.  Cognitive Function:  Patient is alert and oriented x3.   Denies difficulty focusing,  making decisions, memory loss.  MMSE/6CIT deferred. Normal by direct communication/observation.    Immunizations Immunization History  Administered Date(s) Administered  . Influenza, High Dose Seasonal PF 01/22/2020  . Influenza,inj,Quad PF,6+ Mos 02/25/2015, 12/17/2017  . Influenza-Unspecified 01/16/2019, 01/22/2020  . PFIZER(Purple Top)SARS-COV-2 Vaccination 05/07/2019, 05/28/2019, 01/22/2020  . PNEUMOCOCCAL CONJUGATE-20 06/14/2020  . Pneumococcal Polysaccharide-23 10/17/2018  . Tdap 07/25/2011  . Zoster 08/21/2012   Health Maintenance Health Maintenance  Topic Date Due  . COLONOSCOPY (Pts 45-39yrs Insurance coverage will need to be confirmed)  08/24/2020 (Originally 09/10/2016)  . INFLUENZA VACCINE  10/25/2020  . PNA vac Low Risk Adult (2 of 2 - PCV13) 06/14/2021  . TETANUS/TDAP  07/24/2021  . COVID-19 Vaccine  Completed  . Hepatitis C Screening  Completed  . HPV VACCINES  Aged Out   Colorectal cancer screening: Type of screening: Cologuard. Completed 09/11/11. Repeat every 5 years. Scheduled 08/2020.  Lung Cancer Screening: (Low Dose CT Chest recommended if Age 95-80 years, 30 pack-year currently smoking OR have quit w/in 15years.) does not qualify.   Hepatitis C Screening:  Previously deferred.   Vision Screening: Recommended annual ophthalmology exams for early detection of glaucoma and other disorders of the eye. Is the patient up to date with their annual eye exam?  Yes  Who is the provider or what is the name of the office in which the patient attends annual eye exams? [facility]  Dental Screening: Recommended annual dental exams for proper oral hygiene. Visits every 6 months.   Community Resource Referral / Chronic Care Management: CRR required this visit?  No   CCM required this visit?  No      Plan:   Keep all routine maintenance appointments.   I have personally reviewed and noted the following in the patient's chart:   . Medical and social  history . Use of alcohol, tobacco or illicit drugs  . Current medications and supplements . Functional ability and status . Nutritional status . Physical activity . Advanced directives . List of other physicians . Hospitalizations, surgeries, and ER visits in  previous 12 months . Vitals . Screenings to include cognitive, depression, and falls . Referrals and appointments  In addition, I have reviewed and discussed with patient certain preventive protocols, quality metrics, and best practice recommendations. A written personalized care plan for preventive services as well as general preventive health recommendations were provided to patient via mychart.     Varney Biles, LPN   [phone]

## 2020-07-15 NOTE — Patient Instructions (Addendum)
[name] , Thank you for taking time to come for your Medicare Wellness Visit. I appreciate your ongoing commitment to your health goals. Please review the following plan we discussed and let me know if I can assist you in the future.   These are the goals we discussed: Goals    . Maintain Healthy Lifestyle     Stay active Healthy diet       This is a list of the screening recommended for you and due dates:  Health Maintenance  Topic Date Due  . Colon Cancer Screening  08/24/2020*  . Flu Shot  10/25/2020  . Pneumonia vaccines (2 of 2 - PCV13) 06/14/2021  . Tetanus Vaccine  07/24/2021  . COVID-19 Vaccine  Completed  .  Hepatitis C: One time screening is recommended by Center for Disease Control  (CDC) for  adults born from [birth year] through 1963.   Completed  . HPV Vaccine  Aged Out  *Topic was postponed. The date shown is not the original due date.    Immunizations Immunization History  Administered Date(s) Administered  . Influenza, High Dose Seasonal PF 01/22/2020  . Influenza,inj,Quad PF,6+ Mos 02/25/2015, 12/17/2017  . Influenza-Unspecified 01/16/2019, 01/22/2020  . PFIZER(Purple Top)SARS-COV-2 Vaccination 05/07/2019, 05/28/2019, 01/22/2020  . PNEUMOCOCCAL CONJUGATE-20 06/14/2020  . Pneumococcal Polysaccharide-23 10/17/2018  . Tdap 07/25/2011  . Zoster 08/21/2012     Advanced directives: End of life planning; Advance aging; Advanced directives discussed.  Copy of current HCPOA/Living Will requested.    Conditions/risks identified: none new.  Follow up in one year for your annual wellness visit.   Preventive Care 61 Years and Older, Male Preventive care refers to lifestyle choices and visits with your health care provider that can promote health and wellness. What does preventive care include?  A yearly physical exam. This is also called an annual well check.  Dental exams once or twice a year.  Routine eye exams. Ask your health care provider how often you  should have your eyes checked.  Personal lifestyle choices, including:  Daily care of your teeth and gums.  Regular physical activity.  Eating a healthy diet.  Avoiding tobacco and drug use.  Limiting alcohol use.  Practicing safe sex.  Taking low doses of aspirin every day.  Taking vitamin and mineral supplements as recommended by your health care provider. What happens during an annual well check? The services and screenings done by your health care provider during your annual well check will depend on your age, overall health, lifestyle risk factors, and family history of disease. Counseling  Your health care provider may ask you questions about your:  Alcohol use.  Tobacco use.  Drug use.  Emotional well-being.  Home and relationship well-being.  Sexual activity.  Eating habits.  History of falls.  Memory and ability to understand (cognition).  Work and work Statistician. Screening  You may have the following tests or measurements:  Height, weight, and BMI.  Blood pressure.  Lipid and cholesterol levels. These may be checked every 5 years, or more frequently if you are over 35 years old.  Skin check.  Lung cancer screening. You may have this screening every year starting at age 30 if you have a 30-pack-year history of smoking and currently smoke or have quit within the past 15 years.  Fecal occult blood test (FOBT) of the stool. You may have this test every year starting at age 7.  Flexible sigmoidoscopy or colonoscopy. You may have a sigmoidoscopy every 5 years or  a colonoscopy every 10 years starting at age 27.  Prostate cancer screening. Recommendations will vary depending on your family history and other risks.  Hepatitis C blood test.  Hepatitis B blood test.  Sexually transmitted disease (STD) testing.  Diabetes screening. This is done by checking your blood sugar (glucose) after you have not eaten for a while (fasting). You may have this  done every 1-3 years.  Abdominal aortic aneurysm (AAA) screening. You may need this if you are a current or former smoker.  Osteoporosis. You may be screened starting at age 65 if you are at high risk. Talk with your health care provider about your test results, treatment options, and if necessary, the need for more tests. Vaccines  Your health care provider may recommend certain vaccines, such as:  Influenza vaccine. This is recommended every year.  Tetanus, diphtheria, and acellular pertussis (Tdap, Td) vaccine. You may need a Td booster every 10 years.  Zoster vaccine. You may need this after age 48.  Pneumococcal 13-valent conjugate (PCV13) vaccine. One dose is recommended after age 28.  Pneumococcal polysaccharide (PPSV23) vaccine. One dose is recommended after age 89. Talk to your health care provider about which screenings and vaccines you need and how often you need them. This information is not intended to replace advice given to you by your health care provider. Make sure you discuss any questions you have with your health care provider. Document Released: 04/09/2015 Document Revised: 12/01/2015 Document Reviewed: 01/12/2015 Elsevier Interactive Patient Education  2017 [place] Prevention in the Home Falls can cause injuries. They can happen to people of all ages. There are many things you can do to make your home safe and to help prevent falls. What can I do on the outside of my home?  Regularly fix the edges of walkways and driveways and fix any cracks.  Remove anything that might make you trip as you walk through a door, such as a raised step or threshold.  Trim any bushes or trees on the path to your home.  Use bright outdoor lighting.  Clear any walking paths of anything that might make someone trip, such as rocks or tools.  Regularly check to see if handrails are loose or broken. Make sure that both sides of any steps have handrails.  Any raised  decks and porches should have guardrails on the edges.  Have any leaves, snow, or ice cleared regularly.  Use sand or salt on walking paths during winter.  Clean up any spills in your garage right away. This includes oil or grease spills. What can I do in the bathroom?  Use night lights.  Install grab bars by the toilet and in the tub and shower. Do not use towel bars as grab bars.  Use non-skid mats or decals in the tub or shower.  If you need to sit down in the shower, use a plastic, non-slip stool.  Keep the floor dry. Clean up any water that spills on the floor as soon as it happens.  Remove soap buildup in the tub or shower regularly.  Attach bath mats securely with double-sided non-slip rug tape.  Do not have throw rugs and other things on the floor that can make you trip. What can I do in the bedroom?  Use night lights.  Make sure that you have a light by your bed that is easy to reach.  Do not use any sheets or blankets that are too big for your bed.  They should not hang down onto the floor.  Have a firm chair that has side arms. You can use this for support while you get dressed.  Do not have throw rugs and other things on the floor that can make you trip. What can I do in the kitchen?  Clean up any spills right away.  Avoid walking on wet floors.  Keep items that you use a lot in easy-to-reach places.  If you need to reach something above you, use a strong step stool that has a grab bar.  Keep electrical cords out of the way.  Do not use floor polish or wax that makes floors slippery. If you must use wax, use non-skid floor wax.  Do not have throw rugs and other things on the floor that can make you trip. What can I do with my stairs?  Do not leave any items on the stairs.  Make sure that there are handrails on both sides of the stairs and use them. Fix handrails that are broken or loose. Make sure that handrails are as long as the stairways.  Check  any carpeting to make sure that it is firmly attached to the stairs. Fix any carpet that is loose or worn.  Avoid having throw rugs at the top or bottom of the stairs. If you do have throw rugs, attach them to the floor with carpet tape.  Make sure that you have a light switch at the top of the stairs and the bottom of the stairs. If you do not have them, ask someone to add them for you. What else can I do to help prevent falls?  Wear shoes that:  Do not have high heels.  Have rubber bottoms.  Are comfortable and fit you well.  Are closed at the toe. Do not wear sandals.  If you use a stepladder:  Make sure that it is fully opened. Do not climb a closed stepladder.  Make sure that both sides of the stepladder are locked into place.  Ask someone to hold it for you, if possible.  Clearly mark and make sure that you can see:  Any grab bars or handrails.  First and last steps.  Where the edge of each step is.  Use tools that help you move around (mobility aids) if they are needed. These include:  Canes.  Walkers.  Scooters.  Crutches.  Turn on the lights when you go into a dark area. Replace any light bulbs as soon as they burn out.  Set up your furniture so you have a clear path. Avoid moving your furniture around.  If any of your floors are uneven, fix them.  If there are any pets around you, be aware of where they are.  Review your medicines with your doctor. Some medicines can make you feel dizzy. This can increase your chance of falling. Ask your doctor what other things that you can do to help prevent falls. This information is not intended to replace advice given to you by your health care provider. Make sure you discuss any questions you have with your health care provider. Document Released: 01/07/2009 Document Revised: 08/19/2015 Document Reviewed: 04/17/2014 Elsevier Interactive Patient Education  2017 [employer].

## 2020-07-19 NOTE — Progress Notes (Signed)
Cardiology Office Note  Date:  07/20/2020   ID:  Kevin Moore, DOB [birth year], MRN [id number]  PCP:  Burnard Hawthorne, FNP   Chief Complaint  Patient presents with  . 12 month follow up     "doing well." Medications reviewed by the patient verbally.     HPI:  Kevin Moore is a pleasant 70 year old gentleman with history of  hyperlipidemia,  CAD,  S/P PCI of left circumflex and RCA,  HTN,  Dyslipidemia,  VF arrest with polymorphic VT 09/2014  thought secondary to RCA occlusion ,  who presents  for his underlying coronary artery disease  Continues to work long hours Thinking about retiring Reports having chronic knee pain  Weight stable  Periodically checks blood pressure at home, mildly elevated Though does report recently started on Flomax, was having orthostasis symptoms  Labs reviewed LDL continues to run in the 90s on Lipitor 40 with Zetia Reports having memory problems on Lipitor 80, Concern for myalgias on Lipitor 40 May have tried Crestor in the past, unclear of side effects  Bradycardia on B-Blocker, tried previously  EKG personally reviewed by myself on todays visit Shows normal sinus rhythm with rate 60 bpm no significant ST or T-wave changes   other past medical history reviewed  admitted on 10/16/14 as a STEMI , presented to [facility] and experienced Vfib prior to opening his mid Cx occlusion which was successfully stented. He had moderate to severe RCA disease   He  developed polymorphic VT thought secondary to the RCA  Previously unable to tolerate beta blocker as he develops profound bradycardia.  EP was consulted. They recommended Lidocaine drip and stenting x2 of the RCA. once he was stented, he had no further ectopy.   Had MRI that showed scar from prior MI likely causing PVC's, EF 66%, Recommendation was made for no AICD.   PMH:   has no past medical history on file.  PSH:    Past Surgical History:  Procedure Laterality Date  . COLON  SURGERY     partial removal of colon with perforation due to suspected fish bone.  . CORONARY ANGIOPLASTY WITH STENT PLACEMENT  2016   Stent placed to RCA 2, left circumflex 1 ; last [facility]  . KNEE ARTHROSCOPY Left   . TONSILLECTOMY      Current Outpatient Medications  Medication Sig Dispense Refill  . atorvastatin (LIPITOR) 40 MG tablet TAKE 1TABLET (40 MG TOTAL) BY MOUTH DAILY 90 tablet 3  . clopidogrel (PLAVIX) 75 MG tablet Take 1 tablet (75 mg total) by mouth daily. Please keep your upcoming appointment for 90 day refills. 30 tablet 0  . ezetimibe (ZETIA) 10 MG tablet Take 1 tablet (10 mg total) by mouth daily. Please keep your upcoming appointment for 90 day refills. 30 tablet 0  . ketoconazole (NIZORAL) 2 % shampoo Apply 1 application topically 2 (two) times a week.    . Magnesium 250 MG TABS Take 1 tablet by mouth daily.    Marland Kitchen omeprazole (PRILOSEC) 20 MG capsule Take 1 capsule (20 mg total) by mouth daily. Please keep your upcoming appointment for 90 day refills. 30 capsule 0  . tamsulosin (FLOMAX) 0.4 MG CAPS capsule Take 1 capsule (0.4 mg total) by mouth daily. 30 capsule 0  . telmisartan-hydrochlorothiazide (MICARDIS HCT) 40-12.5 MG tablet Take 1 tablet by mouth daily. 90 tablet 3   No current facility-administered medications for this visit.     Allergies:   Patient has no known allergies.  Social History:  The patient  reports that he has never smoked. He has never used smokeless tobacco. He reports current alcohol use of about 3.0 standard drinks of alcohol per week. He reports that he does not use drugs.   Family History:   family history includes Cancer in his mother; Epilepsy in his son; Heart disease in his father; Hyperlipidemia in his mother; Protein C deficiency in his brother.    Review of Systems: Review of Systems  Constitutional: Negative.   Respiratory: Negative.   Cardiovascular: Negative.   Gastrointestinal: Negative.   Musculoskeletal: Positive for joint  pain.  Neurological: Negative.   Psychiatric/Behavioral: Negative.   All other systems reviewed and are negative.   PHYSICAL EXAM: VS:  BP 140/90 (BP Location: Left Arm, Patient Position: Sitting, Cuff Size: Normal)   Pulse 60   Ht 6\' 2"  (1.88 m)   Wt 224 lb (101.6 kg)   SpO2 98%   BMI 28.76 kg/m  , BMI Body mass index is 28.76 kg/m. Constitutional:  oriented to person, place, and time. No distress.  HENT:  Head: Grossly normal Eyes:  no discharge. No scleral icterus.  Neck: No JVD, no carotid bruits  Cardiovascular: Regular rate and rhythm, no murmurs appreciated Pulmonary/Chest: Clear to auscultation bilaterally, no wheezes or rails Abdominal: Soft.  no distension.  no tenderness.  Musculoskeletal: Normal range of motion Neurological:  normal muscle tone. Coordination normal. No atrophy Skin: Skin warm and dry Psychiatric: normal affect, pleasant  Recent Labs: 06/11/2020: Hemoglobin 15.6; Platelets 205.0; TSH 1.31 07/01/2020: ALT 22; BUN 13; Creatinine, Ser 0.92; Potassium 3.9; Sodium 142    Lipid Panel Lab Results  Component Value Date   CHOL 156 06/11/2020   HDL 44.50 06/11/2020   LDLCALC 95 06/11/2020   TRIG 85.0 06/11/2020      Wt Readings from Last 3 Encounters:  07/20/20 224 lb (101.6 kg)  07/15/20 220 lb (99.8 kg)  07/07/20 220 lb (99.8 kg)     ASSESSMENT AND PLAN:  Coronary artery disease involving native coronary artery of native heart with stable angina Currently with no symptoms of angina. No further workup at this time.   Mixed hyperlipidemia Possible myalgias on Lipitor Continue Zetia 10 mg daily, changed to Crestor 40 daily Goal LDL <70 For statin intolerance we will change to PCSK9 inhibitor  Essential (primary) hypertension Mildly elevated blood pressure though having some orthostasis on Flomax For now we have recommended he continue Micardis HCT 40/12.5 Once Flomax held, potentially could try 1.5 tabs of his current dose of Micardis  HCTZ We did discuss Micardis HCTZ 80/12.5 mg daily.  This could be tried if needed  Ventricular tachycardia ([place])  prior history of V. fib arrest prior to stent placement left circumflex and RCA  Felt not to be a ICD candidate based on EP workup at [facility] 2016  Denies recurrent arrhythmia  History of coronary artery stent placement Stent placed to RCA 2, left circumflex 1  No further ischemic work-up at this time Cholesterol management as above   Total encounter time more than 25 minutes  Greater than 50% was spent in counseling and coordination of care with the patient      Orders Placed This Encounter  Procedures  . EKG 12-Lead     Signed, Esmond Plants, M.D., Ph.D. 07/20/2020  [place], [place]

## 2020-07-20 ENCOUNTER — Other Ambulatory Visit: Payer: Self-pay

## 2020-07-20 ENCOUNTER — Encounter: Payer: Self-pay | Admitting: Cardiovascular Disease

## 2020-07-20 ENCOUNTER — Ambulatory Visit: Payer: PPO | Admitting: Cardiovascular Disease

## 2020-07-20 VITALS — BP 140/90 | HR 60 | Ht 74.0 in | Wt 224.0 lb

## 2020-07-20 DIAGNOSIS — E782 Mixed hyperlipidemia: Secondary | ICD-10-CM

## 2020-07-20 DIAGNOSIS — I25118 Atherosclerotic heart disease of native coronary artery with other forms of angina pectoris: Secondary | ICD-10-CM

## 2020-07-20 DIAGNOSIS — I10 Essential (primary) hypertension: Secondary | ICD-10-CM | POA: Diagnosis not present

## 2020-07-20 DIAGNOSIS — I472 Ventricular tachycardia, unspecified: Secondary | ICD-10-CM

## 2020-07-20 MED ORDER — ROSUVASTATIN CALCIUM 40 MG PO TABS: 40.0000 mg | ORAL_TABLET | Freq: Every day | ORAL | 3 refills | Status: DC

## 2020-07-20 MED ORDER — TELMISARTAN-HCTZ 40-12.5 MG PO TABS: 1.0000 | ORAL_TABLET | Freq: Every day | ORAL | 3 refills | Status: DC

## 2020-07-20 NOTE — Patient Instructions (Addendum)
Medication Instructions:  crestor 40 with zetia 10 mg daily   If you need a refill on your cardiac medications before your next appointment, please call your pharmacy.   Lab work: Lipids in 3 months  LABS WILL APPEAR ON MYCHART, ABNORMAL RESULTS  WILL BE CALLED   Testing/Procedures: No new testing needed   Follow-Up:  . You will need a follow up appointment in 12 months  . Providers on your designated Care Team:   . Murray Hodgkins, NP . Christell Faith, PA-C . Marrianne Mood, PA-C  COVID-19 Vaccine Information can be found at: [email or web address] For questions related to vaccine distribution or appointments, please email [email or web address] or call [phone].

## 2020-07-27 ENCOUNTER — Other Ambulatory Visit: Payer: Self-pay | Admitting: Cardiovascular Disease

## 2020-07-27 ENCOUNTER — Ambulatory Visit: Payer: PPO

## 2020-08-05 ENCOUNTER — Other Ambulatory Visit: Payer: PPO

## 2020-08-05 ENCOUNTER — Other Ambulatory Visit: Payer: Self-pay

## 2020-08-05 DIAGNOSIS — R972 Elevated prostate specific antigen [PSA]: Secondary | ICD-10-CM

## 2020-08-06 LAB — PSA: Prostate Specific Ag, Serum: 4.6 ng/mL — ABNORMAL HIGH (ref 0.0–4.0)

## 2020-08-08 ENCOUNTER — Encounter: Payer: Self-pay | Admitting: Urology

## 2020-08-17 ENCOUNTER — Telehealth: Payer: Self-pay | Admitting: Urology

## 2020-08-17 NOTE — Telephone Encounter (Signed)
appts made and sent my chart message to patient after trying his cell and not reaching him.  Sharyn Lull

## 2020-08-17 NOTE — Telephone Encounter (Signed)
-----   Message from Abbie Sons, MD sent at 08/11/2020 12:36 PM EDT ----- Regarding: Follow-up Please schedule 50-month follow-up appointment with PSA

## 2020-09-13 DIAGNOSIS — Z86010 Personal history of colonic polyps: Secondary | ICD-10-CM | POA: Diagnosis not present

## 2020-09-13 DIAGNOSIS — Z7902 Long term (current) use of antithrombotics/antiplatelets: Secondary | ICD-10-CM | POA: Diagnosis not present

## 2020-09-13 DIAGNOSIS — K219 Gastro-esophageal reflux disease without esophagitis: Secondary | ICD-10-CM | POA: Diagnosis not present

## 2020-09-14 ENCOUNTER — Telehealth: Payer: Self-pay | Admitting: Cardiovascular Disease

## 2020-09-14 NOTE — Telephone Encounter (Signed)
   [facility] Pre-operative Risk Assessment    Patient Name: Kevin Moore  DOB: 1950-08-17  MRN: [id number]   HEARTCARE STAFF: - Please ensure there is not already an duplicate clearance open for this procedure. - Under Visit Info/Reason for Call, type in Other and utilize the format Clearance MM/DD/YY or Clearance TBD. Do not use dashes or single digits. - If request is for dental extraction, please clarify the # of teeth to be extracted. - If the patient is currently at the dentist's office, call Pre-Op APP to address. If the patient is not currently in the dentist office, please route to the Pre-Op pool  Request for surgical clearance:  What type of surgery is being performed? Colonoscopy    When is this surgery scheduled? 12/21/20   What type of clearance is required (medical clearance vs. Pharmacy clearance to hold med vs. Both)? both  Are there any medications that need to be held prior to surgery and how long? Plavix instructions   Practice name and name of physician performing surgery? [facility] - Dr Andrey Farmer   What is the office phone number? [phone]   7.   What is the office fax number? [phone]  8.   Anesthesia type (None, local, MAC, general) ? Monitored    Ace Gins 09/14/2020, 4:23 PM  _________________________________________________________________   (provider comments below)

## 2020-09-15 NOTE — Telephone Encounter (Signed)
Dr. Rockey Situ, Pt had STEMI c/b Vfib with subsequent PCI to RCA in 2016. Can he hold plavix for colonoscopy?

## 2020-09-28 NOTE — Telephone Encounter (Signed)
   Primary Cardiologist: Ida Rogue, MD  Chart reviewed as part of pre-operative protocol coverage. Given past medical history and time since last visit, based on ACC/AHA guidelines, Kevin Moore would be at acceptable risk for the planned procedure without further cardiovascular testing.   He may hold his Plavix 5 days prior to his procedure.  He will need to be on aspirin 81 mg during the day as he is not on Plavix.  He may stop  aspirin and resume his Plavix  the evening after his procedure.  I will route this recommendation to the requesting party via Epic fax function and remove from pre-op pool.  Please call with questions.  Jossie Ng. Desirai Traxler NP-C    09/28/2020, [place] [address] Office [phone] Fax [phone]

## 2020-12-06 ENCOUNTER — Encounter: Payer: Self-pay | Admitting: Family

## 2020-12-21 ENCOUNTER — Ambulatory Visit: Admission: RE | Admit: 2020-12-21 | Payer: PPO | Source: Ambulatory Visit

## 2020-12-21 ENCOUNTER — Encounter: Admission: RE | Payer: Self-pay | Source: Ambulatory Visit

## 2020-12-21 SURGERY — COLONOSCOPY WITH PROPOFOL
Anesthesia: General

## 2021-01-10 DIAGNOSIS — Z23 Encounter for immunization: Secondary | ICD-10-CM | POA: Diagnosis not present

## 2021-02-04 DIAGNOSIS — H269 Unspecified cataract: Secondary | ICD-10-CM | POA: Diagnosis not present

## 2021-02-15 ENCOUNTER — Other Ambulatory Visit: Payer: PPO

## 2021-02-21 ENCOUNTER — Ambulatory Visit: Payer: PPO | Admitting: Urology

## 2021-02-28 ENCOUNTER — Other Ambulatory Visit: Payer: Self-pay

## 2021-02-28 DIAGNOSIS — R972 Elevated prostate specific antigen [PSA]: Secondary | ICD-10-CM

## 2021-03-01 ENCOUNTER — Other Ambulatory Visit: Payer: PPO

## 2021-03-01 ENCOUNTER — Encounter: Payer: Self-pay | Admitting: Urology

## 2021-03-01 ENCOUNTER — Other Ambulatory Visit: Payer: Self-pay

## 2021-03-01 DIAGNOSIS — R972 Elevated prostate specific antigen [PSA]: Secondary | ICD-10-CM

## 2021-03-02 ENCOUNTER — Encounter: Payer: Self-pay | Admitting: Urology

## 2021-03-02 LAB — PSA: Prostate Specific Ag, Serum: 70.1 ng/mL — ABNORMAL HIGH (ref 0.0–4.0)

## 2021-03-04 ENCOUNTER — Ambulatory Visit: Payer: PPO | Admitting: Urology

## 2021-03-04 ENCOUNTER — Other Ambulatory Visit: Payer: Self-pay

## 2021-03-04 ENCOUNTER — Encounter: Payer: Self-pay | Admitting: Urology

## 2021-03-04 VITALS — BP 132/74 | HR 80 | Ht 74.0 in | Wt 215.0 lb

## 2021-03-04 DIAGNOSIS — R339 Retention of urine, unspecified: Secondary | ICD-10-CM

## 2021-03-04 DIAGNOSIS — R300 Dysuria: Secondary | ICD-10-CM | POA: Diagnosis not present

## 2021-03-04 DIAGNOSIS — N410 Acute prostatitis: Secondary | ICD-10-CM

## 2021-03-04 DIAGNOSIS — N401 Enlarged prostate with lower urinary tract symptoms: Secondary | ICD-10-CM

## 2021-03-04 DIAGNOSIS — R972 Elevated prostate specific antigen [PSA]: Secondary | ICD-10-CM | POA: Diagnosis not present

## 2021-03-04 LAB — URINALYSIS, COMPLETE
Bilirubin, UA: NEGATIVE
Glucose, UA: NEGATIVE
Ketones, UA: NEGATIVE
Nitrite, UA: NEGATIVE
Protein,UA: NEGATIVE
Specific Gravity, UA: 1.015 (ref 1.005–1.030)
Urobilinogen, Ur: 0.2 mg/dL (ref 0.2–1.0)
pH, UA: 6 (ref 5.0–7.5)

## 2021-03-04 LAB — BLADDER SCAN AMB NON-IMAGING: SCA Result: 337

## 2021-03-04 LAB — MICROSCOPIC EXAMINATION: RBC, Urine: NONE SEEN /hpf (ref 0–2)

## 2021-03-04 MED ORDER — SULFAMETHOXAZOLE-TRIMETHOPRIM 800-160 MG PO TABS: 1.0000 | ORAL_TABLET | Freq: Two times a day (BID) | ORAL | 0 refills | Status: AC

## 2021-03-04 MED ORDER — SILODOSIN 8 MG PO CAPS: 8.0000 mg | ORAL_CAPSULE | Freq: Every day | ORAL | 0 refills | Status: DC

## 2021-03-04 NOTE — Progress Notes (Signed)
03/04/2021 9:19 AM   Kevin Moore December 15, 1950 [id number]  Referring provider: Burnard Hawthorne, FNP [address] [place],  [place] [address]  Chief Complaint  Patient presents with   Dysuria    Urologic history:  1.  Elevated PSA PSA/7/22 7.42 Repeat PSA after a 30-day alpha-blocker was 4.6 Elected surveillance  HPI: 70 y.o. male presents for follow-up of an elevated PSA.  Was scheduled for a 67-month lab visit which was performed earlier this week and PSA was 70.1 Was on an extended car ride last week and states he was holding his urine and did note decreased force and caliber of his urinary stream Over the weekend he had chills and subjective fever along with dysuria He had some tamsulosin leftover from prior visit that he t started taking His voiding pattern has improved and he states fever and chills have resolved Bladder scan PVR today 327 mL   PMH:  Hyperlipidemia   Coronary artery disease   Hypertension   Diverticulitis   GERD (gastroesophageal reflux disease)   Surgical History: Past Surgical History:  Procedure Laterality Date   COLON SURGERY     partial removal of colon with perforation due to suspected fish bone.   CORONARY ANGIOPLASTY WITH STENT PLACEMENT  2016   Stent placed to RCA 2, left circumflex 1 ; last [facility]   KNEE ARTHROSCOPY Left    TONSILLECTOMY      Home Medications:  Allergies as of 03/04/2021   No Known Allergies      Medication List        Accurate as of March 04, 2021  9:19 AM. If you have any questions, ask your nurse or doctor.          STOP taking these medications    ketoconazole 2 % shampoo Commonly known as: NIZORAL Stopped by: Abbie Sons, MD   tamsulosin 0.4 MG Caps capsule Commonly known as: FLOMAX Stopped by: Abbie Sons, MD       TAKE these medications    clopidogrel 75 MG tablet Commonly known as: PLAVIX TAKE 1 TABLET BY MOUTH DAILY   ezetimibe 10 MG  tablet Commonly known as: ZETIA TAKE 1 TABLET BY MOUTH DAILY   Magnesium 250 MG Tabs Take 1 tablet by mouth daily.   omeprazole 20 MG capsule Commonly known as: PRILOSEC TAKE 1 CAPSULE BY MOUTH DAILY   rosuvastatin 40 MG tablet Commonly known as: CRESTOR Take 1 tablet (40 mg total) by mouth daily.   silodosin 8 MG Caps capsule Commonly known as: RAPAFLO Take 1 capsule (8 mg total) by mouth daily with breakfast. Started by: Abbie Sons, MD   sulfamethoxazole-trimethoprim 800-160 MG tablet Commonly known as: BACTRIM DS Take 1 tablet by mouth 2 (two) times daily for 14 days. Started by: Abbie Sons, MD   telmisartan-hydrochlorothiazide 40-12.5 MG tablet Commonly known as: Micardis HCT Take 1 tablet by mouth daily.        Allergies: No Known Allergies  Family History: Family History  Problem Relation Age of Onset   Hyperlipidemia Mother    Cancer Mother        Ovarian, died age 11   Heart disease Father        bypass at age 22   Protein C deficiency Brother    Epilepsy Son     Social History:  reports that he has never smoked. He has never used smokeless tobacco. He reports current alcohol use of about 3.0 standard  drinks per week. He reports that he does not use drugs.   Physical Exam: BP 132/74   Pulse 80   Ht 6\' 2"  (1.88 m)   Wt 215 lb (97.5 kg)   BMI 27.60 kg/m   Constitutional:  Alert and oriented, No acute distress. HEENT: [place] AT, moist mucus membranes.  Trachea midline, no masses. Cardiovascular: No clubbing, cyanosis, or edema. Respiratory: Normal respiratory effort, no increased work of breathing. Psychiatric: Normal mood and affect.   Assessment & Plan:    1.  Acute prostatitis Urinalysis with 11-30 WBCs Urine culture ordered Rx Septra DS twice daily x28 days  2.  Incomplete bladder emptying Multifactorial, acute prostatitis/BPH He has orthostatic symptoms when taking tamsulosin with his blood pressure medication.  Rx for silodosin  sent to pharmacy Follow-up nurse visit 2 weeks repeat bladder scan  3.  Elevated PSA Marked rise secondary to infection Will need to repeat 6-8 weeks  3.  BPH with LUTS    Abbie Sons, MD  [facility] [address], [place], [place] [address] [phone]

## 2021-03-08 LAB — CULTURE, URINE COMPREHENSIVE

## 2021-03-09 ENCOUNTER — Ambulatory Visit: Payer: PPO | Admitting: Urology

## 2021-03-14 ENCOUNTER — Other Ambulatory Visit: Payer: Self-pay

## 2021-03-14 ENCOUNTER — Ambulatory Visit (INDEPENDENT_AMBULATORY_CARE_PROVIDER_SITE_OTHER): Payer: PPO | Admitting: Family Medicine

## 2021-03-14 DIAGNOSIS — R339 Retention of urine, unspecified: Secondary | ICD-10-CM

## 2021-03-14 LAB — BLADDER SCAN AMB NON-IMAGING: Scan Result: 307

## 2021-03-14 NOTE — Progress Notes (Signed)
Patient presents today for a bladder scan. Patient started Silodosin on 03/04/21 the bladder scan residual was 327 at that time. Today the residual was 307. He is scheduled for a repeat PSA on 04/18/2020. Per Dr. Bernardo Heater patient is to 03/22/2021 for a PVR and possible catheter.

## 2021-03-22 ENCOUNTER — Ambulatory Visit (INDEPENDENT_AMBULATORY_CARE_PROVIDER_SITE_OTHER): Payer: PPO

## 2021-03-22 ENCOUNTER — Other Ambulatory Visit: Payer: Self-pay

## 2021-03-22 DIAGNOSIS — R339 Retention of urine, unspecified: Secondary | ICD-10-CM | POA: Diagnosis not present

## 2021-03-22 LAB — BLADDER SCAN AMB NON-IMAGING: Scan Result: 91

## 2021-03-22 NOTE — Progress Notes (Signed)
Patient came in for a repeat PVR. PVR today was 91 ml. Pt declines any urinary symptoms.

## 2021-04-18 ENCOUNTER — Other Ambulatory Visit: Payer: PPO

## 2021-04-18 ENCOUNTER — Other Ambulatory Visit: Payer: Self-pay

## 2021-04-18 DIAGNOSIS — R972 Elevated prostate specific antigen [PSA]: Secondary | ICD-10-CM

## 2021-04-19 LAB — PSA: Prostate Specific Ag, Serum: 4.2 ng/mL — ABNORMAL HIGH (ref 0.0–4.0)

## 2021-05-05 ENCOUNTER — Other Ambulatory Visit: Payer: Self-pay | Admitting: Urology

## 2021-05-09 ENCOUNTER — Other Ambulatory Visit: Payer: Self-pay | Admitting: *Deleted

## 2021-05-09 ENCOUNTER — Encounter: Payer: Self-pay | Admitting: Urology

## 2021-05-09 MED ORDER — TAMSULOSIN HCL 0.4 MG PO CAPS: 0.4000 mg | ORAL_CAPSULE | Freq: Every day | ORAL | 3 refills | Status: DC

## 2021-05-09 MED ORDER — SULFAMETHOXAZOLE-TRIMETHOPRIM 800-160 MG PO TABS: 1.0000 | ORAL_TABLET | Freq: Two times a day (BID) | ORAL | 0 refills | Status: DC

## 2021-05-09 NOTE — Telephone Encounter (Signed)
Okay to send Rx tamsulosin 0.4 mg daily #90/3 refills.  Can send Rx Septra DS 1 twice daily x7 days     Notified patient as instructed, patient pleased. Discussed follow-up appointments, patient agrees

## 2021-05-30 DIAGNOSIS — D2261 Melanocytic nevi of right upper limb, including shoulder: Secondary | ICD-10-CM | POA: Diagnosis not present

## 2021-05-30 DIAGNOSIS — X32XXXA Exposure to sunlight, initial encounter: Secondary | ICD-10-CM | POA: Diagnosis not present

## 2021-05-30 DIAGNOSIS — D0439 Carcinoma in situ of skin of other parts of face: Secondary | ICD-10-CM | POA: Diagnosis not present

## 2021-05-30 DIAGNOSIS — L570 Actinic keratosis: Secondary | ICD-10-CM | POA: Diagnosis not present

## 2021-05-30 DIAGNOSIS — D2262 Melanocytic nevi of left upper limb, including shoulder: Secondary | ICD-10-CM | POA: Diagnosis not present

## 2021-05-30 DIAGNOSIS — Z85828 Personal history of other malignant neoplasm of skin: Secondary | ICD-10-CM | POA: Diagnosis not present

## 2021-05-30 DIAGNOSIS — C4362 Malignant melanoma of left upper limb, including shoulder: Secondary | ICD-10-CM | POA: Diagnosis not present

## 2021-05-30 DIAGNOSIS — D2271 Melanocytic nevi of right lower limb, including hip: Secondary | ICD-10-CM | POA: Diagnosis not present

## 2021-05-30 DIAGNOSIS — D485 Neoplasm of uncertain behavior of skin: Secondary | ICD-10-CM | POA: Diagnosis not present

## 2021-06-01 DIAGNOSIS — E859 Amyloidosis, unspecified: Secondary | ICD-10-CM | POA: Diagnosis not present

## 2021-06-14 DIAGNOSIS — D0362 Melanoma in situ of left upper limb, including shoulder: Secondary | ICD-10-CM | POA: Diagnosis not present

## 2021-06-21 ENCOUNTER — Encounter: Payer: Self-pay | Admitting: Family

## 2021-06-21 DIAGNOSIS — C439 Malignant melanoma of skin, unspecified: Secondary | ICD-10-CM | POA: Insufficient documentation

## 2021-07-12 ENCOUNTER — Other Ambulatory Visit (INDEPENDENT_AMBULATORY_CARE_PROVIDER_SITE_OTHER): Payer: PPO

## 2021-07-12 DIAGNOSIS — E782 Mixed hyperlipidemia: Secondary | ICD-10-CM | POA: Diagnosis not present

## 2021-07-13 LAB — LIPID PANEL
Chol/HDL Ratio: 2.9 ratio (ref 0.0–5.0)
Cholesterol, Total: 126 mg/dL (ref 100–199)
HDL: 44 mg/dL (ref >39)
LDL Chol Calc (NIH): 63 mg/dL (ref 0–99)
Triglycerides: 105 mg/dL (ref 0–149)
VLDL Cholesterol Cal: 19 mg/dL (ref 5–40)

## 2021-08-01 NOTE — Progress Notes (Signed)
Cardiology Office Note ? ?Date:  08/02/2021  ? ?ID:  Kevin Moore, DOB 10/28/1950, MRN [id number] ? ?PCP:  Kevin Hawthorne, FNP  ? ?Chief Complaint  ?Patient presents with  ? 12 month follow up   ?  "Doing well." Medications reviewed by the patient verbally. Patient would like to discuss if it's okay to continue on the omeprazole.   ? ? ?HPI:  ?Kevin Moore is a pleasant 71 year old gentleman with history of  ?hyperlipidemia,  ?CAD,  ?S/P PCI of left circumflex and RCA,  ?HTN,  ?Dyslipidemia,  ?VF arrest with polymorphic VT 09/2014  thought secondary to RCA occlusion ,  ?who presents  for his underlying coronary artery disease ? ?Last seen by myself in clinic April 2022 ? ?Blood pressure mildly elevated today but typically well controlled at home ?At home 131/74  ?Aches and pains, tylenol ? ?Tolerating Crestor with Zetia, total cholesterol in the 120 range LDL 62 ?Was supposed to retire 2 years ago he reports, still works long hours ?Reports having chronic knee pain ?May need to have knee surgery in the future ? ?Trying to lose weight ? ?Bradycardia on B-Blocker, tried previously ? ?EKG personally reviewed by myself on todays visit ?Shows normal sinus rhythm with rate 60 bpm no significant ST or T-wave changes ? ? other past medical history reviewed  ?admitted on 10/16/14 as a STEMI , presented to [facility] and experienced Vfib prior to opening his mid Cx occlusion which was successfully stented. He had moderate to severe RCA disease  ? He  developed polymorphic VT thought secondary to the RCA  ?Previously unable to tolerate beta blocker as he develops profound bradycardia. ? EP was consulted. They recommended Lidocaine drip and stenting x2 of the RCA. ?once he was stented, he had no further ectopy.  ? ?Had MRI that showed scar from prior MI likely causing PVC's, EF 66%, ?Recommendation was made for no AICD. ? ? ?PMH:   has no past medical history on file. ? ?PSH:    ?Past Surgical History:  ?Procedure Laterality  Date  ? COLON SURGERY    ? partial removal of colon with perforation due to suspected fish bone.  ? CORONARY ANGIOPLASTY WITH STENT PLACEMENT  2016  ? Stent placed to RCA ?2, left circumflex ?1 ; last [facility]  ? KNEE ARTHROSCOPY Left   ? TONSILLECTOMY    ? ? ?Current Outpatient Medications  ?Medication Sig Dispense Refill  ? Cholecalciferol 25 MCG (1000 UT) capsule Take 1,000 Units by mouth daily.    ? clopidogrel (PLAVIX) 75 MG tablet TAKE 1 TABLET BY MOUTH DAILY 30 tablet 11  ? ezetimibe (ZETIA) 10 MG tablet TAKE 1 TABLET BY MOUTH DAILY 30 tablet 11  ? Magnesium 250 MG TABS Take 1 tablet by mouth daily.    ? omeprazole (PRILOSEC) 20 MG capsule TAKE 1 CAPSULE BY MOUTH DAILY 30 capsule 11  ? rosuvastatin (CRESTOR) 40 MG tablet Take 1 tablet (40 mg total) by mouth daily. 90 tablet 3  ? telmisartan-hydrochlorothiazide (MICARDIS HCT) 40-12.5 MG tablet Take 1 tablet by mouth daily. 90 tablet 3  ? silodosin (RAPAFLO) 8 MG CAPS capsule Take 1 capsule (8 mg total) by mouth daily with breakfast. (Patient not taking: Reported on 08/02/2021) 30 capsule 0  ? sulfamethoxazole-trimethoprim (BACTRIM DS) 800-160 MG tablet Take 1 tablet by mouth 2 (two) times daily. (Patient not taking: Reported on 08/02/2021) 20 tablet 0  ? tamsulosin (FLOMAX) 0.4 MG CAPS capsule Take 1 capsule (0.4 mg total)  by mouth daily. (Patient not taking: Reported on 08/02/2021) 90 capsule 3  ? ?No current facility-administered medications for this visit.  ? ? ? ?Allergies:   Patient has no known allergies.  ? ?Social History:  The patient  reports that he has never smoked. He has never used smokeless tobacco. He reports current alcohol use of about 3.0 standard drinks per week. He reports that he does not use drugs.  ? ?Family History:   family history includes Cancer in his mother; Epilepsy in his son; Heart disease in his father; Hyperlipidemia in his mother; Protein C deficiency in his brother.  ? ? ?Review of Systems: ?Review of Systems  ?Constitutional:  Negative.   ?Respiratory: Negative.    ?Cardiovascular: Negative.   ?Gastrointestinal: Negative.   ?Musculoskeletal:  Positive for joint pain.  ?Neurological: Negative.   ?Psychiatric/Behavioral: Negative.    ?All other systems reviewed and are negative. ? ?PHYSICAL EXAM: ?VS:  BP (!) 146/82 (BP Location: Left Arm, Patient Position: Sitting, Cuff Size: Normal)   Pulse 60   Ht '6\' 1"'$  (1.854 m)   Wt 220 lb 4 oz (99.9 kg)   SpO2 98%   BMI 29.06 kg/m?  , BMI Body mass index is 29.06 kg/m?Marland Kitchen ?Constitutional:  oriented to person, place, and time. No distress.  ?HENT:  ?Head: Grossly normal ?Eyes:  no discharge. No scleral icterus.  ?Neck: No JVD, no carotid bruits  ?Cardiovascular: Regular rate and rhythm, no murmurs appreciated ?Pulmonary/Chest: Clear to auscultation bilaterally, no wheezes or rails ?Abdominal: Soft.  no distension.  no tenderness.  ?Musculoskeletal: Normal range of motion ?Neurological:  normal muscle tone. Coordination normal. No atrophy ?Skin: Skin warm and dry ?Psychiatric: normal affect, pleasant ? ?Recent Labs: ?No results found for requested labs within last 8760 hours.  ? ? ?Lipid Panel ?Lab Results  ?Component Value Date  ? CHOL 126 07/12/2021  ? HDL 44 07/12/2021  ? [place] 63 07/12/2021  ? TRIG 105 07/12/2021  ? ?  ? ?Wt Readings from Last 3 Encounters:  ?08/02/21 220 lb 4 oz (99.9 kg)  ?03/04/21 215 lb (97.5 kg)  ?07/20/20 224 lb (101.6 kg)  ?  ? ?ASSESSMENT AND PLAN: ? ?Coronary artery disease involving native coronary artery of native heart with stable angina ?Currently with no symptoms of angina. No further workup at this time. Continue current medication regimen. ? ?Mixed hyperlipidemia ?Possible myalgias on Lipitor ?Tolerating Crestor and Zetia, numbers at goal ?No changes made ? ?Essential (primary) hypertension ?Continue telmisartan HCTZ blood pressure at home 297 systolic ?Plans on losing some weight ?Number stable ? ?Ventricular tachycardia ([place]) ? prior history of V. fib arrest  prior to stent placement left circumflex and RCA  ?Felt not to be a ICD candidate based on EP workup at [facility] 2016  ?Has done well since that time with no recurrent arrhythmia, did not tolerate beta-blockers secondary to bradycardia ? ?History of coronary artery stent placement ?Stent placed to RCA ?2, left circumflex ?1  ?Plavix, no aspirin, cholesterol at goal no anginal symptoms ?Non-smoker, no diabetes ? ? Total encounter time more than 30 minutes ? Greater than 50% was spent in counseling and coordination of care with the patient ? ? ? ? ? ?Orders Placed This Encounter  ?Procedures  ? EKG 12-Lead  ? ? ? ?Signed, ?Esmond Plants, M.D., Ph.D. ?08/02/2021  ?[place] ?[phone] ? ? ?

## 2021-08-02 ENCOUNTER — Encounter: Payer: Self-pay | Admitting: Cardiovascular Disease

## 2021-08-02 ENCOUNTER — Ambulatory Visit: Payer: PPO | Admitting: Cardiovascular Disease

## 2021-08-02 VITALS — BP 146/82 | HR 60 | Ht 73.0 in | Wt 220.2 lb

## 2021-08-02 DIAGNOSIS — I472 Ventricular tachycardia, unspecified: Secondary | ICD-10-CM | POA: Diagnosis not present

## 2021-08-02 DIAGNOSIS — I10 Essential (primary) hypertension: Secondary | ICD-10-CM | POA: Diagnosis not present

## 2021-08-02 DIAGNOSIS — I25118 Atherosclerotic heart disease of native coronary artery with other forms of angina pectoris: Secondary | ICD-10-CM

## 2021-08-02 DIAGNOSIS — E782 Mixed hyperlipidemia: Secondary | ICD-10-CM | POA: Diagnosis not present

## 2021-08-02 NOTE — Patient Instructions (Signed)
Medication Instructions:  No changes  If you need a refill on your cardiac medications before your next appointment, please call your pharmacy.   Lab work: No new labs needed  Testing/Procedures: No new testing needed  Follow-Up: At [facility], you and your health needs are our priority.  As part of our continuing mission to provide you with exceptional heart care, we have created designated Provider Care Teams.  These Care Teams include your primary Cardiologist (physician) and Advanced Practice Providers (APPs -  Physician Assistants and Nurse Practitioners) who all work together to provide you with the care you need, when you need it.  You will need a follow up appointment in 12 months  Providers on your designated Care Team:   Christopher Berge, NP Ryan Dunn, PA-C Cadence Furth, PA-C  COVID-19 Vaccine Information can be found at: [email or web address] For questions related to vaccine distribution or appointments, please email [email or web address] or call [phone].   

## 2021-08-08 ENCOUNTER — Other Ambulatory Visit: Payer: Self-pay | Admitting: Cardiovascular Disease

## 2021-08-09 ENCOUNTER — Other Ambulatory Visit: Payer: Self-pay | Admitting: Emergency Medicine

## 2021-08-09 ENCOUNTER — Encounter: Payer: Self-pay | Admitting: Cardiovascular Disease

## 2021-08-09 MED ORDER — OMEPRAZOLE 20 MG PO CPDR
20.0000 mg | DELAYED_RELEASE_CAPSULE | Freq: Every day | ORAL | 11 refills | Status: DC
Start: 2021-08-09 — End: 2022-08-25

## 2021-09-05 ENCOUNTER — Telehealth: Payer: Self-pay | Admitting: Family

## 2021-09-05 NOTE — Telephone Encounter (Signed)
Copied from [place] [id number]. Topic: Medicare AWV >> Sep 05, 2021  1:11 PM Devoria Glassing wrote: Reason for CRM: Left message for patient to schedule Annual Wellness Visit.  Please schedule with Nurse Health Advisor Denisa O'Brien-Blaney, LPN at [facility].  Please call [phone] ask for [facility]

## 2021-09-13 DIAGNOSIS — D0439 Carcinoma in situ of skin of other parts of face: Secondary | ICD-10-CM | POA: Diagnosis not present

## 2021-09-19 ENCOUNTER — Other Ambulatory Visit: Payer: Self-pay

## 2021-09-19 DIAGNOSIS — N401 Enlarged prostate with lower urinary tract symptoms: Secondary | ICD-10-CM

## 2021-09-20 ENCOUNTER — Other Ambulatory Visit: Payer: PPO

## 2021-09-20 DIAGNOSIS — N401 Enlarged prostate with lower urinary tract symptoms: Secondary | ICD-10-CM | POA: Diagnosis not present

## 2021-09-21 LAB — PSA: Prostate Specific Ag, Serum: 7.5 ng/mL — ABNORMAL HIGH (ref 0.0–4.0)

## 2021-09-23 ENCOUNTER — Telehealth: Payer: Self-pay | Admitting: Family Medicine

## 2021-09-23 NOTE — Telephone Encounter (Signed)
Pt returned office and will c/b next week to schedule a u/a drop off.

## 2021-09-23 NOTE — Telephone Encounter (Signed)
LMOM for patient to return call.

## 2021-09-23 NOTE — Telephone Encounter (Signed)
-----   Message from Abbie Sons, MD sent at 09/23/2021  6:57 AM EDT ----- PSA was 7.5.  Please schedule lab visit for UA and possible culture.  If UA is negative recommend prostate MRI.

## 2021-10-14 ENCOUNTER — Telehealth: Payer: Self-pay | Admitting: Family

## 2021-10-14 NOTE — Telephone Encounter (Signed)
Copied from [place] [id number]. Topic: Medicare AWV >> Oct 14, 2021 11:31 AM Devoria Glassing wrote: Reason for CRM: Left message for patient to schedule Annual Wellness Visit.  Please schedule with Nurse Health Advisor Denisa O'Brien-Blaney, LPN at [facility]. This appt can be telephone or office visit.  Please call [phone] ask for [facility]

## 2021-10-21 DIAGNOSIS — S83512A Sprain of anterior cruciate ligament of left knee, initial encounter: Secondary | ICD-10-CM | POA: Diagnosis not present

## 2021-10-21 DIAGNOSIS — M1732 Unilateral post-traumatic osteoarthritis, left knee: Secondary | ICD-10-CM | POA: Diagnosis not present

## 2021-10-21 DIAGNOSIS — M25562 Pain in left knee: Secondary | ICD-10-CM | POA: Diagnosis not present

## 2021-10-25 ENCOUNTER — Telehealth: Payer: Self-pay

## 2021-10-25 DIAGNOSIS — M150 Primary generalized (osteo)arthritis: Secondary | ICD-10-CM | POA: Diagnosis not present

## 2021-11-09 ENCOUNTER — Other Ambulatory Visit: Payer: Self-pay | Admitting: Surgery

## 2021-11-18 ENCOUNTER — Telehealth: Payer: Self-pay | Admitting: *Deleted

## 2021-11-18 ENCOUNTER — Encounter (HOSPITAL_COMMUNITY): Payer: Self-pay | Admitting: Urgent Care

## 2021-11-18 ENCOUNTER — Encounter
Admission: RE | Admit: 2021-11-18 | Discharge: 2021-11-18 | Disposition: A | Discharge status: Home / Self Care | Payer: PPO | Source: Ambulatory Visit | Attending: Surgery | Admitting: Surgery

## 2021-11-18 VITALS — BP 150/88 | HR 54 | Resp 16 | Ht 73.0 in | Wt 221.4 lb

## 2021-11-18 DIAGNOSIS — R8290 Unspecified abnormal findings in urine: Secondary | ICD-10-CM | POA: Insufficient documentation

## 2021-11-18 DIAGNOSIS — Z01818 Encounter for other preprocedural examination: Secondary | ICD-10-CM

## 2021-11-18 DIAGNOSIS — Z01812 Encounter for preprocedural laboratory examination: Secondary | ICD-10-CM | POA: Diagnosis not present

## 2021-11-18 HISTORY — DX: Gastro-esophageal reflux disease without esophagitis: K21.9

## 2021-11-18 HISTORY — DX: Essential (primary) hypertension: I10

## 2021-11-18 HISTORY — DX: Acute myocardial infarction, unspecified: I21.9

## 2021-11-18 HISTORY — DX: Atherosclerotic heart disease of native coronary artery without angina pectoris: I25.10

## 2021-11-18 HISTORY — DX: Malignant (primary) neoplasm, unspecified: C80.1

## 2021-11-18 LAB — CBC WITH DIFFERENTIAL/PLATELET
Abs Immature Granulocytes: 0.01 10*3/uL (ref 0.00–0.07)
Basophils Absolute: 0 10*3/uL (ref 0.0–0.1)
Basophils Relative: 0 %
Eosinophils Absolute: 0.1 10*3/uL (ref 0.0–0.5)
Eosinophils Relative: 1 %
HCT: 43.5 % (ref 39.0–52.0)
Hemoglobin: 14.6 g/dL (ref 13.0–17.0)
Immature Granulocytes: 0 %
Lymphocytes Relative: 28 %
Lymphs Abs: 2 10*3/uL (ref 0.7–4.0)
MCH: 30.4 pg (ref 26.0–34.0)
MCHC: 33.6 g/dL (ref 30.0–36.0)
MCV: 90.6 fL (ref 80.0–100.0)
Monocytes Absolute: 0.6 10*3/uL (ref 0.1–1.0)
Monocytes Relative: 9 %
Neutro Abs: 4.5 10*3/uL (ref 1.7–7.7)
Neutrophils Relative %: 62 %
Platelets: 198 10*3/uL (ref 150–400)
RBC: 4.8 MIL/uL (ref 4.22–5.81)
RDW: 11.5 % (ref 11.5–15.5)
WBC: 7.3 10*3/uL (ref 4.0–10.5)
nRBC: 0 % (ref 0.0–0.2)

## 2021-11-18 LAB — URINALYSIS, ROUTINE W REFLEX MICROSCOPIC
Bilirubin Urine: NEGATIVE
Glucose, UA: NEGATIVE mg/dL
Hgb urine dipstick: NEGATIVE
Ketones, ur: NEGATIVE mg/dL
Nitrite: POSITIVE — AB
Protein, ur: NEGATIVE mg/dL
Specific Gravity, Urine: 1.013 (ref 1.005–1.030)
WBC, UA: >50 WBC/hpf — ABNORMAL HIGH (ref 0–5)
pH: 6 (ref 5.0–8.0)

## 2021-11-18 LAB — TYPE AND SCREEN
ABO/RH(D): O POS
Antibody Screen: NEGATIVE

## 2021-11-18 LAB — SURGICAL PCR SCREEN
MRSA, PCR: NEGATIVE
Staphylococcus aureus: NEGATIVE

## 2021-11-18 LAB — COMPREHENSIVE METABOLIC PANEL
ALT: 22 U/L (ref 0–44)
AST: 19 U/L (ref 15–41)
Albumin: 4 g/dL (ref 3.5–5.0)
Alkaline Phosphatase: 99 U/L (ref 38–126)
Anion gap: 6 (ref 5–15)
BUN: 19 mg/dL (ref 8–23)
CO2: 25 mmol/L (ref 22–32)
Calcium: 9.4 mg/dL (ref 8.9–10.3)
Chloride: 109 mmol/L (ref 98–111)
Creatinine, Ser: 0.96 mg/dL (ref 0.61–1.24)
GFR, Estimated: >60 mL/min (ref >60)
Glucose, Bld: 94 mg/dL (ref 70–99)
Potassium: 3.8 mmol/L (ref 3.5–5.1)
Sodium: 140 mmol/L (ref 135–145)
Total Bilirubin: 1 mg/dL (ref 0.3–1.2)
Total Protein: 6.8 g/dL (ref 6.5–8.1)

## 2021-11-18 NOTE — Telephone Encounter (Signed)
   Name: Reyes Aldaco Wenberg  DOB: Jul 13, 1950  MRN: [id number]  Primary Cardiologist: Ida Rogue, MD  Chart reviewed as part of pre-operative protocol coverage. Because of Woodruff Skirvin Neary's past medical history and time since last visit, he will require a follow-up telephone visit in order to better assess preoperative cardiovascular risk.  Pre-op covering staff: - Please schedule appointment and call patient to inform them. If patient already had an upcoming appointment within acceptable timeframe, please add "pre-op clearance" to the appointment notes so provider is aware. - Please contact requesting surgeon's office via preferred method (i.e, phone, fax) to inform them of need for appointment prior to surgery.  Per our office protocol, Okay to hold Plavix x5 days prior to the procedure.  Please restart when medically safe to do so.  Elgie Collard, PA-C  11/18/2021, 4:24 PM

## 2021-11-18 NOTE — Pre-Procedure Instructions (Signed)
Pleasant gentleman here for preadmit visit and preop instructions.  A&Ox4, able to give accurate and complete health history and medication list.  Patient still works and is very active, currently he can readily walk a mile or more without any cardiac symptoms.  History of MI with RCA stenting in 2016, prior to stenting patient did have an episode of VFib, he was not a candidate for AICD implantation.  Subsequent to Cardiac stents patient has had no further episodes of chest pain, no shortness of breath, no cardiac arrhythmias aside from a history of becoming severely bradycardic (HR to 20's) with any Beta Blockers.  Last saw cardiologist, Dr. Rockey Situ, in May for his annual follow up and was found to be stable with no changes made to treatment plan.  In preparation for surgery, patient will stop plavix for 7 days, he has been advised or the risk of being of anticoagulants.  Discussed postoperative plan with patient.  He is aware that there is a possibility that he can go home the day of surgery.  If possible, he would like to be able to do that.  All DME has already been delivered to his home.

## 2021-11-18 NOTE — Telephone Encounter (Signed)
Request for pre-operative cardiac clearance Received: Today Karen Kitchens, NP  P Cv Div Preop Callback Request for pre-operative cardiac clearance:     1. What type of surgery is being performed?  LEFT TOTAL KNEE ARTHROPLASTY   2. When is this surgery scheduled?  12/01/2021     3. Type of clearance being requested (medical, pharmacy, both).  BOTH     4. Are there any medications that need to be held prior to surgery?  CLOPIDOGREL   5. Practice name and name of physician performing surgery?  Performing surgeon: Dr. Milagros Evener, MD  Requesting clearance: [name], FNP-C       6. Anesthesia type (none, local, MAC, general)?  GENERAL   7. What is the office phone and fax number?    Phone: [phone]  Fax: [phone]   ATTENTION: Unable to create telephone message as per your standard workflow. Directed by [facility] providers to send requests for cardiac clearance to this pool for appropriate distribution to provider covering pre-operative clearances.   [name], MSN, APRN, FNP-C, CEN  [facility]  Peri-operative Services Nurse Practitioner  Phone: [phone]  11/18/21 3:02 PM

## 2021-11-18 NOTE — Telephone Encounter (Signed)
Left message for the pt to call back for televisit pre op appt

## 2021-11-18 NOTE — Patient Instructions (Signed)
Your procedure is scheduled on: 12/01/21 Report to Day Surgery. To find out your arrival time please call [phone] between 1PM - 3PM on 11/30/21.  Remember: Instructions that are not followed completely may result in serious medical risk,  up to and including death, or upon the discretion of your surgeon and anesthesiologist your  surgery may need to be rescheduled.     _X__ 1. Do not eat food after midnight the night before your procedure.                 No chewing gum or hard candies. You may drink clear liquids up to 2 hours                 before you are scheduled to arrive for your surgery- DO not drink clear                 liquids within 2 hours of the start of your surgery.                 Clear Liquids include:  water, apple juice without pulp, clear Gatorade, G2 or                  Gatorade Zero (avoid Red/Purple/Blue), Black Coffee or Tea (Do not add                 anything to coffee or tea). _____2.   Complete the "Ensure Clear Pre-surgery Clear Carbohydrate Drink"  provided to you, 2 hours before arrival.  Drink 4AM __X__3.  On the morning of surgery brush your teeth with toothpaste and water, you                may rinse your mouth with mouthwash if you wish.  Do not swallow any         toothpaste of mouthwash.     _X__ 4.  No Alcohol for 24 hours before or after surgery.   ___ 5.  Do Not Smoke or use e-cigarettes For 24 Hours Prior to Your Surgery.                 Do not use any chewable tobacco products for at least 6 hours prior to                 Surgery.  _X__  5.  Do not use any recreational drugs (marijuana, cocaine, heroin, ecstasy, MDMA or other) For at least one week prior to your surgery.       Combination of these drugs with anesthesia may have life threatening results.  ____  6.  Bring all medications with you on the day of surgery if instructed.   ____  7.  Notify your doctor if there is any change in your medical condition       (cold, fever, infections).     Do not wear jewelry, make-up, hairpins, clips or nail polish. Do not wear lotions, powders, or perfumes. You may wear deodorant. Do not shave 48 hours prior to surgery. Men may shave face and neck. Do not bring valuables to the hospital.    [facility] is not responsible for any belongings or valuables.  Contacts, dentures or bridgework may not be worn into surgery. Leave your suitcase in the car. After surgery it may be brought to your room. For patients admitted to the hospital, discharge time is determined by your treatment team.   Patients discharged the day of surgery will not be  allowed to drive home.   Make arrangements for someone to be with you for the first 24 hours of your Same Day Discharge.    Please read over the following fact sheets that you were given:   Pain Booklet, Coughing and Deep Breathing, Total Joint Packet, and MRSA Information    ____ Take these medicines the morning of surgery with A SIP OF WATER:    1.   2.   3.   4.  5.  6.  ____ Fleet Enema (as directed)   __X__ Use CHG Soap (or wipes) as directed  ____ Use Benzoyl Peroxide Gel as instructed  ____ Use inhalers on the day of surgery  ____ Stop metformin 2 days prior to surgery    ____ Take 1/2 of usual insulin dose the night before surgery. No insulin the morning          of surgery.   __X_ Stop Coumadin/Plavix/aspirin on (last dose on 11/23/21)  ___X_ Stop Anti-inflammatories on Do not take ibuprofen for 7 days prior to surgery (last dose on 11/23/21)   ___X_ Stop Vitamin B, until after surgery.    ____ Bring C-Pap to the hospital.    If you have any questions regarding your pre-procedure instructions,  Please call Pre-admit Testing at [phone]

## 2021-11-21 ENCOUNTER — Telehealth: Payer: Self-pay | Admitting: Cardiovascular Disease

## 2021-11-21 ENCOUNTER — Telehealth: Payer: Self-pay | Admitting: Urgent Care

## 2021-11-21 DIAGNOSIS — N419 Inflammatory disease of prostate, unspecified: Secondary | ICD-10-CM

## 2021-11-21 DIAGNOSIS — Z01812 Encounter for preprocedural laboratory examination: Secondary | ICD-10-CM

## 2021-11-21 LAB — URINE CULTURE: Culture: >=100000 — AB

## 2021-11-21 MED ORDER — NITROFURANTOIN MONOHYD MACRO 100 MG PO CAPS: 100.0000 mg | ORAL_CAPSULE | Freq: Two times a day (BID) | ORAL | 0 refills | Status: AC

## 2021-11-21 NOTE — Telephone Encounter (Signed)
Patient called stating his surgery has been postponed to the end of September because he has to take a round of antibiotics.  Patient stated his doctor will submit new pre-op clearance request when next scheduled.

## 2021-11-21 NOTE — Telephone Encounter (Signed)
Will update the pre op provider as FYI.

## 2021-11-21 NOTE — Telephone Encounter (Signed)
S/w the pt and he tells me that surgery has been postponed. Pt states he is going to need to be on ABX probably until the end of September at least. Pt states once surgery is back on the table he will have the surgeon office fax over a new clearance and go from there again.

## 2021-11-21 NOTE — Progress Notes (Signed)
[facility] Perioperative Services: Pre-Admission/Anesthesia Testing  Abnormal Lab Notification and Treatment Plan of Care   Date: 11/21/21  Name: Kevin Moore MRN:   [id number]  Re: Abnormal labs noted during PAT appointment   Notified:  Provider Name Provider Role Notification Mode  Poggi, Jenny Reichmann, MD Orthopedics (Surgeon) Routed and/or faxed via [facility]   Abnormal Lab Value(s):   Lab Results  Component Value Date   COLORURINE YELLOW (A) 11/18/2021   APPEARANCEUR HAZY (A) 11/18/2021   LABSPEC 1.013 11/18/2021   PHURINE 6.0 11/18/2021   GLUCOSEU NEGATIVE 11/18/2021   HGBUR NEGATIVE 11/18/2021   BILIRUBINUR NEGATIVE 11/18/2021   KETONESUR NEGATIVE 11/18/2021   PROTEINUR NEGATIVE 11/18/2021   UROBILINOGEN 0.2 05/10/2016   NITRITE POSITIVE (A) 11/18/2021   LEUKOCYTESUR MODERATE (A) 11/18/2021   EPIU 0-5 11/18/2021   WBCU >50 (H) 11/18/2021   RBCU 0-5 11/18/2021   BACTERIA RARE (A) 11/18/2021   CULT >=100,000 COLONIES/mL STAPHYLOCOCCUS EPIDERMIDIS (A) 11/18/2021   Clinical Information and Notes:  Patient is scheduled for an elective LEFT TOTAL KNEE ARTHROPLASTY on 12/01/2021  UA performed in PAT consistent with/concerning for infection.  No leukocytosis noted on CBC; WBC 7300 Renal function: Estimated Creatinine Clearance: 90.5 mL/min (by C-G formula based on SCr of 0.96 mg/dL). Urine C&S added to assess for pathogenically significant growth.  Impression and Plan:  Kevin Moore with a UA that was concerning or infection; reflex culture sent. Culture grew out Staphylococcus epidermis. Contacted patient to discuss. Patient is virtually symptomatic. He notes some changes to his urinary stream and the sensation of fullness in his rectal area. Of note, patient treated in December for bacterial prostatitis following a prolonged care ride. Patient was treated with 28 days of SMZ-TMP DS. In speaking with patient, I learned that he got off of a 9 hour  flight prior to coming in for his PAT visit.   Given history of prostatitis, I reached out to patient's urologist to discuss. MD concurs that recent prolonged flight, symptom constellation, and urine studies are consistent with suspected recurrent prostatitis. Discussed upcoming surgery. Urology deferring to orthopedics on whether to proceed or not. Given the concern for PJI associated with current infection, I reached out to orthopedic surgeon to discuss. MD made aware of results from urine testing and discussion with urology. MD agrees that surgery should be postponed pending resolution of the infection. I have been in contact with the patient to make him aware of the above. Patient had a retained supply of SMZ-TMP DS tablets that he prophylactically started on Saturday afternoon (11/19/2021). He was advised that change in therapy may be required based on sensitivity report.   In review of the FINAL culture, isolated pathogen is resistant to SMZ-TMP. Patient contact to make him aware of need to change antimicrobial therapy. Given the need for prolonged course of therapy, urology recommending against flouroquinolones. Dr. Bernardo Heater recommending nitrofurantoin, as it has been shown to achieve good prostate tissue levels. Will treat as follows:  Allergies reviewed. Culture report also reviewed to ensure culture appropriate coverage is being provided. Will treat with a  28  day course of NITROFURANTOIN. Patient encouraged to complete the entire course of antibiotics even if he begins to feel better.   Meds ordered this encounter  Medications   nitrofurantoin, macrocrystal-monohydrate, (MACROBID) 100 MG capsule    Sig: Take 1 capsule (100 mg total) by mouth 2 (two) times daily for 28 days. Increase fluid intake.    Dispense:  56 capsule  Refill:  0   Patient encouraged to increase his fluid intake while taking antibiotics.    May use Tylenol as needed for pain/fever should he experience these symptoms.    Patient instructed to call PAT office, or Dr. Dene Gentry office, with any questions or concerns related to the above outlined course of treatment. Additionally, he was instructed to call if he feels like he is getting worse overall while on treatment. Specifically cautioned to have a low threshold of reporting pain, fevers, further changes to his urinary stream, or inability to void. Results and treatment plan of care forwarded to primary attending surgeon to make them aware.Will postponed surgery for 4 weeks in order for patient to be treated with antibiotics. After completion, ideally I would like to recheck his urine. If clear, surgery can safely be rescheduled. Will notify Dr. Nicholaus Bloom surgery scheduler to make them aware of plan as it stands at this point.   Encounter Diagnoses  Name Primary?   Pre-operative laboratory examination Yes   Prostatitis    [name], MSN, APRN, FNP-C, CEN [facility]  Peri-operative Services Nurse Practitioner Phone: [phone] Fax: [phone] 11/21/21 10:02 AM  NOTE: This note has been prepared using Dragon dictation software. Despite my best ability to proofread, there is always the potential that unintentional transcriptional errors may still occur from this process.

## 2021-12-01 ENCOUNTER — Ambulatory Visit: Admission: RE | Admit: 2021-12-01 | Payer: PPO | Source: Ambulatory Visit | Admitting: Surgery

## 2021-12-01 ENCOUNTER — Encounter: Admission: RE | Payer: Self-pay | Source: Ambulatory Visit

## 2021-12-01 SURGERY — ARTHROPLASTY, KNEE, TOTAL
Anesthesia: Choice | Site: Knee | Laterality: Left

## 2021-12-15 DIAGNOSIS — Z85828 Personal history of other malignant neoplasm of skin: Secondary | ICD-10-CM | POA: Diagnosis not present

## 2021-12-15 DIAGNOSIS — D2261 Melanocytic nevi of right upper limb, including shoulder: Secondary | ICD-10-CM | POA: Diagnosis not present

## 2021-12-15 DIAGNOSIS — L570 Actinic keratosis: Secondary | ICD-10-CM | POA: Diagnosis not present

## 2021-12-15 DIAGNOSIS — D2262 Melanocytic nevi of left upper limb, including shoulder: Secondary | ICD-10-CM | POA: Diagnosis not present

## 2021-12-15 DIAGNOSIS — D2271 Melanocytic nevi of right lower limb, including hip: Secondary | ICD-10-CM | POA: Diagnosis not present

## 2021-12-15 DIAGNOSIS — X32XXXA Exposure to sunlight, initial encounter: Secondary | ICD-10-CM | POA: Diagnosis not present

## 2021-12-16 ENCOUNTER — Other Ambulatory Visit: Payer: PPO

## 2021-12-21 ENCOUNTER — Ambulatory Visit: Payer: PPO | Admitting: Urology

## 2021-12-28 ENCOUNTER — Ambulatory Visit: Payer: PPO | Admitting: Urology

## 2021-12-28 ENCOUNTER — Encounter: Payer: Self-pay | Admitting: Urology

## 2021-12-28 VITALS — BP 116/90 | HR 78 | Ht 74.0 in | Wt 215.0 lb

## 2021-12-28 DIAGNOSIS — R972 Elevated prostate specific antigen [PSA]: Secondary | ICD-10-CM

## 2021-12-28 DIAGNOSIS — R339 Retention of urine, unspecified: Secondary | ICD-10-CM

## 2021-12-28 DIAGNOSIS — N401 Enlarged prostate with lower urinary tract symptoms: Secondary | ICD-10-CM | POA: Diagnosis not present

## 2021-12-28 LAB — BLADDER SCAN AMB NON-IMAGING: Scan Result: 293

## 2021-12-28 MED ORDER — TAMSULOSIN HCL 0.4 MG PO CAPS: 0.4000 mg | ORAL_CAPSULE | Freq: Every day | ORAL | 2 refills | Status: DC

## 2021-12-28 NOTE — Progress Notes (Signed)
12/28/2021 7:45 AM   Kevin Moore 02-12-1951 [id number]  Referring provider: Burnard Hawthorne, FNP [address] [place],  [name] [address]  Chief Complaint  Patient presents with   Benign Prostatic Hypertrophy    HPI: 71 y.o. male with a history of BPH with incomplete bladder emptying, elevated PSA and recurrent prostatitis  I last saw him in December 2022 when his PSA was 70 after an extended car ride and he was complaining of obstructive voiding symptoms.  PVR at that visit was 327 mL Urinalysis showed pyuria and urine culture grew strep anginosus Rx silodosin was sent to pharmacy and a follow-up PSA was recommended and was back to baseline at 4.28 March 2021.  A repeat PSA June 2023 was 7.5 in repeat urinalysis was recommended He was scheduled for a total knee replacement early September 2023 however urinalysis showed significant pyuria and culture positive for staph epidermis.  He completed an extended antibiotic course and currently states he is doing well He is not taking silodosin or tamsulosin as he is not having any significant symptoms.   PMH: Past Medical History:  Diagnosis Date   Cancer ([place])    Coronary artery disease    GERD (gastroesophageal reflux disease)    Hypertension    Myocardial infarction [facility])     Surgical History: Past Surgical History:  Procedure Laterality Date   COLON SURGERY     partial removal of colon with perforation due to suspected fish bone.   CORONARY ANGIOPLASTY WITH STENT PLACEMENT  2016   Stent placed to RCA 2, left circumflex 1 ; last [facility]   ESOPHAGEAL DILATION     KNEE ARTHROSCOPY Left    TONSILLECTOMY      Home Medications:  Allergies as of 12/28/2021       Reactions   Beta Adrenergic Blockers Other (See Comments)   Heart rate drops into the 20s if he takes beta blockers.        Medication List        Accurate as of December 28, 2021 11:59 PM. If you have any questions, ask your nurse or  doctor.          STOP taking these medications    silodosin 8 MG Caps capsule Commonly known as: RAPAFLO Stopped by: Abbie Sons, MD   sulfamethoxazole-trimethoprim 800-160 MG tablet Commonly known as: BACTRIM DS Stopped by: Abbie Sons, MD       TAKE these medications    Cholecalciferol 25 MCG (1000 UT) capsule Take 1,000 Units by mouth daily.   clopidogrel 75 MG tablet Commonly known as: PLAVIX TAKE 1 TABLET BY MOUTH DAILY   ezetimibe 10 MG tablet Commonly known as: ZETIA TAKE 1 TABLET BY MOUTH DAILY   Magnesium 250 MG Tabs Take 1 tablet by mouth daily.   omeprazole 20 MG capsule Commonly known as: PRILOSEC Take 1 capsule (20 mg total) by mouth daily.   rosuvastatin 40 MG tablet Commonly known as: CRESTOR TAKE 1 TABLET BY MOUTH DAILY   tamsulosin 0.4 MG Caps capsule Commonly known as: FLOMAX Take 1 capsule (0.4 mg total) by mouth daily.   telmisartan-hydrochlorothiazide 40-12.5 MG tablet Commonly known as: MICARDIS HCT TAKE 1 TABLET BY MOUTH DAILY        Allergies:  Allergies  Allergen Reactions   Beta Adrenergic Blockers Other (See Comments)    Heart rate drops into the 20s if he takes beta blockers.    Family History: Family History  Problem  Relation Age of Onset   Hyperlipidemia Mother    Cancer Mother        Ovarian, died age 65   Heart disease Father        bypass at age 23   Protein C deficiency Brother    Epilepsy Son     Social History:  reports that he has never smoked. He has never used smokeless tobacco. He reports current alcohol use of about 3.0 standard drinks of alcohol per week. He reports that he does not use drugs.   Physical Exam: BP (!) 116/90   Pulse 78   Ht '6\' 2"'$  (1.88 m)   Wt 215 lb (97.5 kg)   BMI 27.60 kg/m   Constitutional:  Alert and oriented, No acute distress. HEENT: [place] AT, moist mucus membranes.  Trachea midline, no masses. Cardiovascular: No clubbing, cyanosis, or edema. Respiratory: Normal  respiratory effort, no increased work of breathing. GI: Abdomen is soft, nontender, nondistended, no abdominal masses GU: No CVA tenderness Skin: No rashes, bruises or suspicious lesions. Neurologic: Grossly intact, no focal deficits, moving all 4 extremities. Psychiatric: Normal mood and affect.  Laboratory Data:  Urinalysis Dipstick/microscopy negative   Assessment & Plan:    1. Benign prostatic hyperplasia with incomplete bladder emptying PVR today was 293 mL Recommend he restart tamsulosin and follow-up 1 month for repeat PVR His incomplete bladder emptying may be a source of his recurrent bacteriuria  2.  Elevated PSA Repeat PSA today  3.  Recurrent prostatitis UA today clear and he is asymptomatic    Abbie Sons, MD  [facility] [address] [place], [place] [address] [phone]

## 2021-12-29 LAB — MICROSCOPIC EXAMINATION
Bacteria, UA: NONE SEEN
Epithelial Cells (non renal): NONE SEEN /hpf (ref 0–10)

## 2021-12-29 LAB — URINALYSIS, COMPLETE
Bilirubin, UA: NEGATIVE
Glucose, UA: NEGATIVE
Ketones, UA: NEGATIVE
Leukocytes,UA: NEGATIVE
Nitrite, UA: NEGATIVE
Protein,UA: NEGATIVE
RBC, UA: NEGATIVE
Specific Gravity, UA: <1.005 — ABNORMAL LOW (ref 1.005–1.030)
Urobilinogen, Ur: 0.2 mg/dL (ref 0.2–1.0)
pH, UA: 6.5 (ref 5.0–7.5)

## 2021-12-29 LAB — PSA: Prostate Specific Ag, Serum: 4.5 ng/mL — ABNORMAL HIGH (ref 0.0–4.0)

## 2022-01-01 LAB — CULTURE, URINE COMPREHENSIVE

## 2022-01-02 ENCOUNTER — Encounter: Payer: Self-pay | Admitting: *Deleted

## 2022-01-16 ENCOUNTER — Encounter: Payer: Self-pay | Admitting: Urology

## 2022-01-18 ENCOUNTER — Other Ambulatory Visit: Payer: Self-pay | Admitting: *Deleted

## 2022-01-18 ENCOUNTER — Encounter: Payer: Self-pay | Admitting: *Deleted

## 2022-01-18 MED ORDER — SILODOSIN 8 MG PO CAPS: 8.0000 mg | ORAL_CAPSULE | Freq: Every day | ORAL | 0 refills | Status: DC

## 2022-01-27 ENCOUNTER — Ambulatory Visit: Payer: PPO | Admitting: Urology

## 2022-02-02 ENCOUNTER — Ambulatory Visit: Payer: PPO | Admitting: Urology

## 2022-02-02 ENCOUNTER — Encounter: Payer: Self-pay | Admitting: Urology

## 2022-02-02 VITALS — BP 160/96 | HR 67 | Ht 74.0 in | Wt 215.0 lb

## 2022-02-02 DIAGNOSIS — R339 Retention of urine, unspecified: Secondary | ICD-10-CM | POA: Diagnosis not present

## 2022-02-02 LAB — BLADDER SCAN AMB NON-IMAGING: Scan Result: 168

## 2022-02-02 MED ORDER — SILODOSIN 8 MG PO CAPS: 8.0000 mg | ORAL_CAPSULE | Freq: Every day | ORAL | 3 refills | Status: DC

## 2022-02-03 NOTE — Progress Notes (Signed)
Restarted silodosin 1 month ago and was asked to come in for a follow-up bladder scan which has significantly improved and was 168 mL (previous 293).  Follow-up 6 months for repeat bladder scan or earlier for recurrent infection or voiding problems

## 2022-03-09 ENCOUNTER — Ambulatory Visit (INDEPENDENT_AMBULATORY_CARE_PROVIDER_SITE_OTHER): Payer: PPO | Admitting: Family Medicine

## 2022-03-09 ENCOUNTER — Encounter: Payer: Self-pay | Admitting: Family Medicine

## 2022-03-09 VITALS — BP 140/78 | HR 55 | Temp 99.2°F | Ht 74.0 in | Wt 219.5 lb

## 2022-03-09 DIAGNOSIS — J069 Acute upper respiratory infection, unspecified: Secondary | ICD-10-CM

## 2022-03-09 DIAGNOSIS — R051 Acute cough: Secondary | ICD-10-CM | POA: Diagnosis not present

## 2022-03-09 LAB — POC COVID19 BINAXNOW: SARS Coronavirus 2 Ag: NEGATIVE

## 2022-03-09 MED ORDER — DOXYCYCLINE HYCLATE 100 MG PO TABS: 100.0000 mg | ORAL_TABLET | Freq: Two times a day (BID) | ORAL | 0 refills | Status: DC

## 2022-03-09 NOTE — Progress Notes (Signed)
Kevin T. Yuvin Bussiere, MD, [name] at [facility] [name], [address]  Phone: [phone]  FAX: [name] - 71 y.o. male  MRN [id number]  Date of Birth: 07-22-1950  Date: 03/09/2022  PCP: Burnard Hawthorne, FNP  Referral: Burnard Hawthorne, FNP  Chief Complaint  Patient presents with   Cough    Dry X 2 days No Covid Test Has Knee Replacement Surgery scheduled for 03/28/22   Scratchy Throat   Subjective:   Kevin Moore is a 71 y.o. very pleasant male patient with Body mass index is 28.18 kg/m. who presents with the following:  Patient presents with a scratchy cough and general feeling of unwell.  I have known this patient for many years.  Thanksgiving weekend, started on Thursday, no congestion or anything, and then it went away.  After 2 or 3 days, his symptoms resolved and he felt completely fine.  This past Saturday, he got leaves up.  Took some Delsym - this morning and does not feel bad.  For the last 3 to 4 days he has had some nasal congestion and coughing.  He is not having any fever, and he denies any diffuse polyarthralgia or myalgia.  He denies nausea, vomiting, diarrhea.  He has no neurological changes and no changes in taste or smell.   Review of Systems is noted in the HPI, as appropriate  Objective:   BP (!) 140/78   Pulse (!) 55   Temp 99.2 F (37.3 C) (Oral)   Ht '6\' 2"'$  (1.88 m)   Wt 219 lb 8 oz (99.6 kg)   SpO2 96%   BMI 28.18 kg/m    Gen: WDWN, NAD. Globally Non-toxic HEENT: Throat clear, w/o exudate, R TM clear, L TM - good landmarks, No fluid present. rhinnorhea.  MMM Frontal sinuses: NT Max sinuses: NT NECK: Anterior cervical  LAD is absent CV: RRR, No M/G/R, cap refill <2 sec PULM: Breathing comfortably in no respiratory distress. no wheezing, crackles, rhonchi   Laboratory and Imaging Data: Results for orders placed or performed in visit on  03/09/22  POC COVID-19  Result Value Ref Range   SARS Coronavirus 2 Ag Negative Negative     Assessment and Plan:     ICD-10-CM   1. Viral URI  J06.9     2. Acute cough  R05.1 POC COVID-19     Seems to be most likely viral URI.  I am and have him continue doing some supportive care.  He is having hip replacement preop at the beginning and next week, so I am going to have him hold a prescription of doxycycline at the pharmacy.  If he is not improving in 3 to 4 days, be reasonable to start this in light of his upcoming hip replacement.  Medication Management during today's office visit: Meds ordered this encounter  Medications   doxycycline (VIBRA-TABS) 100 MG tablet    Sig: Take 1 tablet (100 mg total) by mouth 2 (two) times daily.    Dispense:  20 tablet    Refill:  0   There are no discontinued medications.  Orders placed today for conditions managed today: Orders Placed This Encounter  Procedures   POC COVID-19    Disposition: No follow-ups on file.  Dr[employer] One speech-to-text software was used for transcription in this dictation.  Possible transcriptional errors can occur using Editor, commissioning.   Signed,  Maud Deed.  Anaka Beazer, MD   Outpatient Encounter Medications as of 03/09/2022  Medication Sig   Ascorbic Acid (VITAMIN C WITH ROSE HIPS) 500 MG tablet Take 500 mg by mouth daily.   Cholecalciferol 25 MCG (1000 UT) capsule Take 1,000 Units by mouth daily.   clopidogrel (PLAVIX) 75 MG tablet TAKE 1 TABLET BY MOUTH DAILY   doxycycline (VIBRA-TABS) 100 MG tablet Take 1 tablet (100 mg total) by mouth 2 (two) times daily.   ezetimibe (ZETIA) 10 MG tablet TAKE 1 TABLET BY MOUTH DAILY   Magnesium 250 MG TABS Take 1 tablet by mouth daily.   omeprazole (PRILOSEC) 20 MG capsule Take 1 capsule (20 mg total) by mouth daily.   rosuvastatin (CRESTOR) 40 MG tablet TAKE 1 TABLET BY MOUTH DAILY   silodosin (RAPAFLO) 8 MG CAPS capsule Take 1 capsule (8 mg total) by mouth  daily with breakfast.   telmisartan-hydrochlorothiazide (MICARDIS HCT) 40-12.5 MG tablet TAKE 1 TABLET BY MOUTH DAILY   zinc gluconate 50 MG tablet Take 50 mg by mouth daily.   No facility-administered encounter medications on file as of 03/09/2022.

## 2022-03-15 ENCOUNTER — Other Ambulatory Visit: Payer: Self-pay

## 2022-03-15 ENCOUNTER — Other Ambulatory Visit: Payer: Self-pay | Admitting: Surgery

## 2022-03-15 ENCOUNTER — Telehealth: Payer: Self-pay | Admitting: *Deleted

## 2022-03-15 ENCOUNTER — Encounter
Admission: RE | Admit: 2022-03-15 | Discharge: 2022-03-15 | Disposition: A | Discharge status: Home / Self Care | Payer: PPO | Source: Ambulatory Visit | Attending: Surgery | Admitting: Surgery

## 2022-03-15 VITALS — BP 145/82 | HR 57 | Resp 16 | Wt 220.7 lb

## 2022-03-15 DIAGNOSIS — R001 Bradycardia, unspecified: Secondary | ICD-10-CM | POA: Insufficient documentation

## 2022-03-15 DIAGNOSIS — Z01812 Encounter for preprocedural laboratory examination: Secondary | ICD-10-CM

## 2022-03-15 DIAGNOSIS — Z01818 Encounter for other preprocedural examination: Secondary | ICD-10-CM | POA: Insufficient documentation

## 2022-03-15 HISTORY — DX: Other specified health status: Z78.9

## 2022-03-15 HISTORY — DX: Inflammatory disease of prostate, unspecified: N41.9

## 2022-03-15 HISTORY — DX: Hyperlipidemia, unspecified: E78.5

## 2022-03-15 HISTORY — DX: Personal history of colon polyps, unspecified: Z86.0100

## 2022-03-15 HISTORY — DX: Long term (current) use of antithrombotics/antiplatelets: Z79.02

## 2022-03-15 HISTORY — DX: Unspecified osteoarthritis, unspecified site: M19.90

## 2022-03-15 HISTORY — DX: Personal history of colonic polyps: Z86.010

## 2022-03-15 HISTORY — DX: Malignant melanoma of skin, unspecified: C43.9

## 2022-03-15 LAB — CBC WITH DIFFERENTIAL/PLATELET
Abs Immature Granulocytes: 0.02 10*3/uL (ref 0.00–0.07)
Basophils Absolute: 0.1 10*3/uL (ref 0.0–0.1)
Basophils Relative: 1 %
Eosinophils Absolute: 0.1 10*3/uL (ref 0.0–0.5)
Eosinophils Relative: 1 %
HCT: 45.6 % (ref 39.0–52.0)
Hemoglobin: 15 g/dL (ref 13.0–17.0)
Immature Granulocytes: 0 %
Lymphocytes Relative: 37 %
Lymphs Abs: 2.6 10*3/uL (ref 0.7–4.0)
MCH: 29.6 pg (ref 26.0–34.0)
MCHC: 32.9 g/dL (ref 30.0–36.0)
MCV: 89.9 fL (ref 80.0–100.0)
Monocytes Absolute: 0.6 10*3/uL (ref 0.1–1.0)
Monocytes Relative: 9 %
Neutro Abs: 3.7 10*3/uL (ref 1.7–7.7)
Neutrophils Relative %: 52 %
Platelets: 214 10*3/uL (ref 150–400)
RBC: 5.07 MIL/uL (ref 4.22–5.81)
RDW: 11.7 % (ref 11.5–15.5)
WBC: 7.1 10*3/uL (ref 4.0–10.5)
nRBC: 0 % (ref 0.0–0.2)

## 2022-03-15 LAB — COMPREHENSIVE METABOLIC PANEL
ALT: 33 U/L (ref 0–44)
AST: 29 U/L (ref 15–41)
Albumin: 4.2 g/dL (ref 3.5–5.0)
Alkaline Phosphatase: 102 U/L (ref 38–126)
Anion gap: 8 (ref 5–15)
BUN: 15 mg/dL (ref 8–23)
CO2: 28 mmol/L (ref 22–32)
Calcium: 9.2 mg/dL (ref 8.9–10.3)
Chloride: 104 mmol/L (ref 98–111)
Creatinine, Ser: 0.85 mg/dL (ref 0.61–1.24)
GFR, Estimated: >60 mL/min (ref >60)
Glucose, Bld: 85 mg/dL (ref 70–99)
Potassium: 3.8 mmol/L (ref 3.5–5.1)
Sodium: 140 mmol/L (ref 135–145)
Total Bilirubin: 0.7 mg/dL (ref 0.3–1.2)
Total Protein: 7.1 g/dL (ref 6.5–8.1)

## 2022-03-15 LAB — TYPE AND SCREEN
ABO/RH(D): O POS
Antibody Screen: NEGATIVE

## 2022-03-15 LAB — URINALYSIS, ROUTINE W REFLEX MICROSCOPIC
Bilirubin Urine: NEGATIVE
Glucose, UA: NEGATIVE mg/dL
Hgb urine dipstick: NEGATIVE
Ketones, ur: NEGATIVE mg/dL
Leukocytes,Ua: NEGATIVE
Nitrite: NEGATIVE
Protein, ur: NEGATIVE mg/dL
Specific Gravity, Urine: 1.006 (ref 1.005–1.030)
pH: 6 (ref 5.0–8.0)

## 2022-03-15 LAB — SURGICAL PCR SCREEN
MRSA, PCR: NEGATIVE
Staphylococcus aureus: NEGATIVE

## 2022-03-15 NOTE — Patient Instructions (Addendum)
Your procedure is scheduled on:03/28/22 - Tuesday Report to the Registration Desk on the 1st floor of the [facility]. To find out your arrival time, please call [phone] between 1PM - 3PM on: 03/24/22 - Friday If your arrival time is 6:00 am, do not arrive prior to that time as the [facility] entrance doors do not open until 6:00 am.  REMEMBER: Instructions that are not followed completely may result in serious medical risk, up to and including death; or upon the discretion of your surgeon and anesthesiologist your surgery may need to be rescheduled.  Do not eat food after midnight the night before surgery.  No gum chewing, lozengers or hard candies.  You may however, drink CLEAR liquids up to 2 hours before you are scheduled to arrive for your surgery. Do not drink anything within 2 hours of your scheduled arrival time.  Clear liquids include: - water  - apple juice without pulp - gatorade (not RED colors) - black coffee or tea (Do NOT add milk or creamers to the coffee or tea) Do NOT drink anything that is not on this list.  In addition, your doctor has ordered for you to drink the provided  Ensure Pre-Surgery Clear Carbohydrate Drink Drinking this carbohydrate drink up to two hours before surgery helps to reduce insulin resistance and improve patient outcomes. Please complete drinking 2 hours prior to scheduled arrival time.  TAKE THESE MEDICATIONS THE MORNING OF SURGERY WITH A SIP OF WATER:  - ezetimibe (ZETIA)  - omeprazole (PRILOSEC)  - rosuvastatin (CRESTOR)  - silodosin (RAPAFLO)   HOLD clopidogrel (PLAVIX) beginning 03/21/22.  One week prior to surgery: stop beginning 03/21/22 Stop Anti-inflammatories (NSAIDS) such as Advil, Aleve, Ibuprofen, Motrin, Naproxen, Naprosyn and Aspirin based products such as Excedrin, Goodys Powder, BC Powder.  Stop ANY OVER THE COUNTER supplements until after surgery. Stop beginning 03/21/22.  You may however, continue to take  Tylenol if needed for pain up until the day of surgery.  No Alcohol for 24 hours before or after surgery.  No Smoking including e-cigarettes for 24 hours prior to surgery.  No chewable tobacco products for at least 6 hours prior to surgery.  No nicotine patches on the day of surgery.  Do not use any "recreational" drugs for at least a week prior to your surgery.  Please be advised that the combination of cocaine and anesthesia may have negative outcomes, up to and including death. If you test positive for cocaine, your surgery will be cancelled.  On the morning of surgery brush your teeth with toothpaste and water, you may rinse your mouth with mouthwash if you wish. Do not swallow any toothpaste or mouthwash.  Use CHG Soap or wipes as directed on instruction sheet.  Do not wear jewelry, make-up, hairpins, clips or nail polish.  Do not wear lotions, powders, or perfumes.   Do not shave body from the neck down 48 hours prior to surgery just in case you cut yourself which could leave a site for infection. Also, freshly shaved skin may become irritated if using the CHG soap.  Contact lenses, hearing aids and dentures may not be worn into surgery.  Do not bring valuables to the hospital. [facility] is not responsible for any missing/lost belongings or valuables.   Notify your doctor if there is any change in your medical condition (cold, fever, infection).  Wear comfortable clothing (specific to your surgery type) to the hospital.  After surgery, you can help prevent lung complications  by doing breathing exercises.  Take deep breaths and cough every 1-2 hours. Your doctor may order a device called an Incentive Spirometer to help you take deep breaths. When coughing or sneezing, hold a pillow firmly against your incision with both hands. This is called "splinting." Doing this helps protect your incision. It also decreases belly discomfort.  If you are being admitted to the hospital  overnight, leave your suitcase in the car. After surgery it may be brought to your room.  If you are being discharged the day of surgery, you will not be allowed to drive home. You will need a responsible adult (18 years or older) to drive you home and stay with you that night.   If you are taking public transportation, you will need to have a responsible adult (18 years or older) with you. Please confirm with your physician that it is acceptable to use public transportation.   Please call the [facility] Dept. at [phone] if you have any questions about these instructions.  Surgery Visitation Policy:  Patients undergoing a surgery or procedure may have two family members or support persons with them as long as the person is not COVID-19 positive or experiencing its symptoms.   Inpatient Visitation:    Visiting hours are 7 a.m. to 8 p.m. Up to four visitors are allowed at one time in a patient room. The visitors may rotate out with other people during the day. One designated support person (adult) may remain overnight.  Due to an increase in RSV and influenza rates and associated hospitalizations, children ages 79 and under will not be able to visit patients in [facility]. Masks continue to be strongly recommended.

## 2022-03-15 NOTE — Telephone Encounter (Signed)
Hi Dr. Rockey Situ,   Kevin Moore is planning on having a left knee replacement on 03/28/2022 and is being asked to hold Plavix for 7 days before hand. He has a history of CAD with STEMI in 2016 s/p DES to LCX. He was also noted to have moderate to severe RCA disease. During cath, he developed polymorphic VT and was shocked. This was felt to be due to his RCA disease and he ultimately underwent stenting of his RCA x2 as well. He was last seen by you in 07/2021 at which time he was doing well from a cardiac standpoint. He has been maintained on Plavix monotherapy.   Are you OK with him holding his Plavix for 7 days? Please route response back to [facility] PREOP.  Thank you! Kevin Moore

## 2022-03-15 NOTE — Telephone Encounter (Signed)
-----   Message from Karen Kitchens, NP sent at 03/15/2022  2:49 PM EST ----- Regarding: Request for pre-operative cardiac clearance Request for pre-operative cardiac clearance:  1. What type of surgery is being performed?  LEFT TOTAL KNEE ARTHROPLASTY  2. When is this surgery scheduled?  03/28/2021  3. Type of clearance being requested (medical, pharmacy, both)? BOTH   4. Are there any medications that need to be held prior to surgery? CLOPIDOGREL - surgery is requesting 7 day hold  5. Practice name and name of physician performing surgery?  Performing surgeon: Dr. Milagros Evener, MD Requesting clearance: [name], FNP-C    6. Anesthesia type (none, local, MAC, general)? GENERAL  7. What is the office phone and fax number?   Phone: [phone] Fax: [phone]  ATTENTION: Unable to create telephone message as per your standard workflow. Directed by [facility] providers to send requests for cardiac clearance to this pool for appropriate distribution to provider covering pre-operative clearances.   [name], MSN, APRN, FNP-C, CEN [facility]  Peri-operative Services Nurse Practitioner Phone: [phone] 03/15/22 2:49 PM

## 2022-03-17 ENCOUNTER — Telehealth: Payer: Self-pay | Admitting: *Deleted

## 2022-03-17 ENCOUNTER — Encounter: Payer: Self-pay | Admitting: Surgery

## 2022-03-17 DIAGNOSIS — M1732 Unilateral post-traumatic osteoarthritis, left knee: Secondary | ICD-10-CM | POA: Diagnosis not present

## 2022-03-17 NOTE — Telephone Encounter (Signed)
Pt has been scheduled for tele pre op appt 03/21/22 @ 1:40. Add on due to med hold and date of procedure.     Patient Consent for Virtual Visit        Kevin Moore has provided verbal consent on 03/17/2022 for a virtual visit (video or telephone).   CONSENT FOR VIRTUAL VISIT FOR:  Kevin Moore  By participating in this virtual visit I agree to the following:  I hereby voluntarily request, consent and authorize [facility] and its employed or contracted physicians, physician assistants, nurse practitioners or other licensed health care professionals (the Practitioner), to provide me with telemedicine health care services (the "Services") as deemed necessary by the treating Practitioner. I acknowledge and consent to receive the Services by the Practitioner via telemedicine. I understand that the telemedicine visit will involve communicating with the Practitioner through live audiovisual communication technology and the disclosure of certain medical information by electronic transmission. I acknowledge that I have been given the opportunity to request an in-person assessment or other available alternative prior to the telemedicine visit and am voluntarily participating in the telemedicine visit.  I understand that I have the right to withhold or withdraw my consent to the use of telemedicine in the course of my care at any time, without affecting my right to future care or treatment, and that the Practitioner or I may terminate the telemedicine visit at any time. I understand that I have the right to inspect all information obtained and/or recorded in the course of the telemedicine visit and may receive copies of available information for a reasonable fee.  I understand that some of the potential risks of receiving the Services via telemedicine include:  Delay or interruption in medical evaluation due to technological equipment failure or disruption; Information transmitted  may not be sufficient (e.g. poor resolution of images) to allow for appropriate medical decision making by the Practitioner; and/or  In rare instances, security protocols could fail, causing a breach of personal health information.  Furthermore, I acknowledge that it is my responsibility to provide information about my medical history, conditions and care that is complete and accurate to the best of my ability. I acknowledge that Practitioner's advice, recommendations, and/or decision may be based on factors not within their control, such as incomplete or inaccurate data provided by me or distortions of diagnostic images or specimens that may result from electronic transmissions. I understand that the practice of medicine is not an exact science and that Practitioner makes no warranties or guarantees regarding treatment outcomes. I acknowledge that a copy of this consent can be made available to me via my patient portal ([facility]), or I can request a printed copy by calling the office of [facility].    I understand that my insurance will be billed for this visit.   I have read or had this consent read to me. I understand the contents of this consent, which adequately explains the benefits and risks of the Services being provided via telemedicine.  I have been provided ample opportunity to ask questions regarding this consent and the Services and have had my questions answered to my satisfaction. I give my informed consent for the services to be provided through the use of telemedicine in my medical care

## 2022-03-17 NOTE — Telephone Encounter (Addendum)
   Name: Kevin Moore  DOB: 04/20/1950  MRN: [id number]  Primary Cardiologist: Ida Rogue, MD   Preoperative team, please contact this patient and set up a phone call appointment for further preoperative risk assessment. Please obtain consent and complete medication review. Thank you for your help.  I confirm that guidance regarding antiplatelet and oral anticoagulation therapy has been completed and, if necessary, noted below.   Per Dr. Rockey Situ- "Acceptable risk for procedure  Okay to hold Plavix 7 days prior to surgery  On days when not taking Plavix would take aspirin 81 mg daily  Would restart Plavix when cleared by surgical team"   Patient will just need a phone call to verify.  He was last seen in the clinic on 08/02/2021.  Deberah Pelton, NP 03/17/2022, 3:05 PM [place]

## 2022-03-17 NOTE — Progress Notes (Signed)
Perioperative Services  Pre-Admission/Anesthesia Testing Clinical Review  Date: 03/21/22  Patient Demographics:  Name: Kevin Moore DOB:   [birth year] MRN:   [id number]  Planned Surgical Procedure(s):    Case: [id number] Date/Time: 03/28/22 1015   Procedure: TOTAL KNEE ARTHROPLASTY (Left: Knee)   Anesthesia type: Choice   Pre-op diagnosis:      Acute pain of left knee M25.562     Chronic rupture of ACL of left knee S83.512A     Post-traumatic osteoarthritis of left knee M17.32   Location: [facility] OR ROOM 03 / [place] [facility]   Surgeons: Corky Mull, MD   NOTE: Available PAT nursing documentation and vital signs have been reviewed. Clinical nursing staff has updated patient's PMH/PSHx, current medication list, and drug allergies/intolerances to ensure comprehensive history available to assist in medical decision making as it pertains to the aforementioned surgical procedure and anticipated anesthetic course. Extensive review of available clinical information performed. Kevin Moore PMH and PSHx updated with any diagnoses/procedures that  may have been inadvertently omitted during his intake with the pre-admission testing department's nursing staff.  Clinical Discussion:  Kevin Moore is a 71 y.o. male who is submitted for pre-surgical anesthesia review and clearance prior to him undergoing the above procedure. Patient has never been a smoker. Pertinent PMH includes: CAD, inferior STEMI, ventricular fibrillation (during cardiac catheterization), ventricular tachycardia (post cardiac catherization), HTN, HLD, GERD, OA, recurrent bacterial prostatitis.  Patient is followed by cardiology Rockey Situ, MD). He was last seen in the cardiology clinic on 08/02/2021; notes reviewed.  At the time of his clinic visit, patient doing well overall from a cardiovascular perspective.  He denied any angina/anginal equivalent symptoms; no episodes of chest pain, short of breath, PND,  orthopnea, palpitations, significant peripheral edema, vertiginous symptoms, or presyncope/syncope.  Patient with past medical history significant for cardiovascular diagnoses.  Patient suffered an inferior wall STEMI on 10/17/2014.    Diagnostic LEFT heart catheterization was performed revealing multivessel CAD; less than 50% LAD, 50-60% proximal portion of the mid diagonal, 50% proximal LCx, 99% mid major marginal, and 80-90% stenosis of the proximal two thirds of the RCA.    During his catheterization, there was a spontaneous occlusion of the subtotal LCx causing patient to develop ventricular fibrillation.  Review of notes did not indicate cardiac arrest during this event.  Patient was defibrillated x 2 (200 J) prior to restoring NSR.  Procedure was continued and PCI was performed placing a 2.5 x 12 mm Xience Alpine DES x 1 to the LCx.  Following ventricular dysrhythmia, patient was started on an amiodarone drip, which resulted in bradycardia.  Patient was subsequently started on dopamine drip, however this medication caused shortness of breath and was discontinued.    While admitted to the ICU postcatheterization, patient developed polymorphic ventricular tachycardia.  Electrophysiology was consulted and lidocaine drip was ordered. Patient stabilized with the addition of the lidocaine. Given the refractory arrhythmias and frequent PVCs, which were felt to be secondary to known disease of the RCA, EP recommended PCI of the RCA. EP also made the recommendation to defer AICD placement at that point.   TTE performed on 10/17/2014 revealed a normal left ventricular systolic function with an EF of 55%.  There was hypokinesis of the basal posterolateral wall.  Trace tricuspid and mild mitral valve regurgitation noted.  There was mild aortic valve sclerosis with no evidence of stenosis.  Mild mitral annular calcification observed.  PASP mildly elevated at 36 mmHg.  Patient  underwent recommended staged PCI  procedure on 10/20/2014 placing a 3.0 x 32 mm Synergy DES to the proximal RCA and a 2.5 x 38 mm Synergy DES to the distal RCA.  Procedure yielded excellent angiographic result and TIMI-3 flow.  Cardiac MRI performed on 02/04/2015 revealed a normal left ventricular systolic function with an EF of 66.7%.  Right ventricular size and function normal.  Left atrium was dilated.  Abnormal LGE noted with evidence of prior MI in the LCx/RCA territory.  There was subendocardial infarct and patchy scar to the basal to mid inferior and inferolateral walls in close proximity to the posteromedial papillary muscle.  Some portions of the scar appear to be transmural or nearly transmural.  Following MI and stent placement, patient remains on daily antiplatelet therapy using clopidogrel.  Patient is reportedly compliant with therapy with no evidence or reports of GI bleeding. Blood pressure mildly elevated at 146/82 on currently prescribed ARB (telmisartan) and diuretic (HCTZ) therapies. Patient is intolerant of beta-blockers as they resulted in profound bradycardia. Patient is on rosuvastatin + ezetimibe for his HLD diagnosis and further ASCVD prevention. He is not diabetic. Patient does not have an OSAH diagnosis. Patient continues to work and maintains an active lifestyle. Functional capacity somewhat limited by chronic knee pain, however with that being said, patient still felt to be able to achieve at least 4 METS of physical activity without experiencing any angina/anginal equivalent symptoms. No changes were made to his medication regimen. Patient to follow-up with outpatient cardiology and 1 year or sooner if needed.  Kevin Moore is scheduled for an elective LEFT TOTAL KNEE ARTHROPLASTY on 03/28/2022 with Dr. Milagros Evener, MD. Of note, patient's procedure scheduled earlier this year, however in review of his preoperative workup and assessment with me (PAT APP), he was found to have recurrent prostatitis.  Procedure was postponed and patient was treated with an extended course (28 days) of oral nitrofurantoin. Patient has been seen in follow up consult by urology. Prostatitis had resolved. Procedure rescheduled as previously mentioned. Given patient's past medical history significant for cardiovascular diagnoses, presurgical cardiac clearance was sought by the PAT team. Per cardiology, "according to the RCRI, his perioperative risk of MACE is 0.9%. His functional capacity in METs is 8.97 according to the DASI. Therefore, based on ACC/AHA guidelines, patient would be at an ACCEPTABLE risk for the planned procedure without further cardiovascular testing".   In review of his medication reconciliation, it is noted that patient is currently on prescribed daily antiplatelet therapy. He has been instructed on recommendations for holding his clopidogrel for 7 days prior to his procedure with plans to restart as soon as postoperative bleeding risk felt to be minimized by his attending surgeon. Of note, cardiology recommending that patient start a low dose ASA for the period during which his clopidogrel will be held. This was discussed with the patient during his conversation with the clearing cardiology provider. The patient has been instructed that his last dose of his clopidogrel should be on 03/20/2022.  Patient denies previous perioperative complications with anesthesia in the past.  In review his EMR, there are no records available for review pertaining to past procedural/anesthetic courses within the [facility] system.     03/15/2022    1:02 PM 03/09/2022   10:13 AM 02/02/2022    2:16 PM  Vitals with BMI  Height  '6\' 2"'$  '6\' 2"'$   Weight 220 lbs 11 oz 219 lbs 8 oz 215 lbs  BMI 28.32 28.17 27.59  Systolic 606 301 601  Diastolic 82 78 96  Pulse 57 55 67    Providers/Specialists:   NOTE: Primary physician provider listed below. Patient may have been seen by APP or partner within same practice.   PROVIDER  ROLE / SPECIALTY LAST OV  Poggi, Marshall Cork, MD Orthopedics (Surgeon) 10/21/2021  Burnard Hawthorne, FNP Primary Care Provider 03/09/2022  Ida Rogue, MD Cardiology 08/02/2021   Allergies:  Beta adrenergic blockers  Current Home Medications:   No current facility-administered medications for this encounter.    Ascorbic Acid (VITAMIN C WITH ROSE HIPS) 500 MG tablet   Cholecalciferol 25 MCG (1000 UT) capsule   clopidogrel (PLAVIX) 75 MG tablet   doxycycline (VIBRA-TABS) 100 MG tablet   ezetimibe (ZETIA) 10 MG tablet   ibuprofen (ADVIL) 200 MG tablet   Magnesium 250 MG TABS   omeprazole (PRILOSEC) 20 MG capsule   rosuvastatin (CRESTOR) 40 MG tablet   silodosin (RAPAFLO) 8 MG CAPS capsule   telmisartan-hydrochlorothiazide (MICARDIS HCT) 40-12.5 MG tablet   zinc gluconate 50 MG tablet   History:   Past Medical History:  Diagnosis Date   Acute ST elevation myocardial infarction (STEMI) of inferior wall ([place]) 10/17/2014   a.) LHC 10/17/2014: <50% LAD, 50-60% prox mid diag, 50% pLCx, 99% mid major marg, 80-90% prox 2/3s RCA - during cath, spontaneous occ of subtotal LCx caused VF (defib 200J x 2). PCI of LCx (2.5 x 12 mm Xience Alpine). Started on amio gtt (bradycardia). Started on dopamine (SOB - d/c'd). Post-cath, dev WCT/polymorphic VT. EP consulted and lidocaine gtt started; b.) PCI of RCA (DES x2) on 10/20/2014   Beta-blocker intolerance    a.) causes profound bradycardia   Coronary artery disease    a.) LHC 10/17/2014: <50% LAD, 50-60% prox portion of mid diagonal, 50% pLCx, 99% mid major marginal, 80-90% proximal 2/3s RCA  --> PCI performed placing a 2.5 x 12 mm Xience Alpine DES x 1 mLCx.; b.) PCI 10/20/2014: 80-90% o-mRCA --> 3.0 x 32 mm (proximal) and 2.5 x 38 mm (distal) Synergy DES placed.   GERD (gastroesophageal reflux disease)    History of colonic polyps    HLD (hyperlipidemia)    Hypertension    Long term current use of antithrombotics/antiplatelets    a.)  clopidogrel   Melanoma ([place])    Osteoarthritis    Polymorphic ventricular tachycardia ([place]) 10/17/2014   a.) post cardiac catheterization --> developed WCT/polymorphic VT. EP consulted and lidocaine gtt started --> felt to be secondary to known RCA disease. PCI of RCA recommended; no AICD deemed necessary at that time.   Prostatitis    Ventricular fibrillation ([place]) 10/17/2014   a.) in setting of STEMI 10/17/2014 --> during cath, spontaneous occlusion of subtotal LCx caused VF requring defib 200J x 2. PCI of LCx (2.5 x 12 mm Xience Alpine DES). Started on amiodarone gtt, which caused bradycardia. Started on dopamine, which caused SOB (d/c'd).   Past Surgical History:  Procedure Laterality Date   COLON SURGERY     partial removal of colon with perforation due to suspected fish bone.   CORONARY ANGIOPLASTY WITH STENT PLACEMENT  10/17/2014   Procedure: CORONARY ANGIOPLASTY WITH STENT PLACEMENT (2.5 x 12 mm Xience Alpine to [employer]); Location: [facility]   CORONARY STENT INTERVENTION Left 10/20/2014   Procedure: CORONARY STENT INTERVENTION (3.0 x 32 mm Synergy DES pRCA, 2.5 x 38 mm Synergy DES dRCA); Location: [facility]   ESOPHAGEAL DILATION     KNEE ARTHROSCOPY Left    TONSILLECTOMY  Family History  Problem Relation Age of Onset   Hyperlipidemia Mother    Cancer Mother        Ovarian, died age 69   Heart disease Father        bypass at age 64   Protein C deficiency Brother    Epilepsy Son    Social History   Tobacco Use   Smoking status: Never   Smokeless tobacco: Never  Vaping Use   Vaping Use: Never used  Substance Use Topics   Alcohol use: Yes    Alcohol/week: 3.0 standard drinks of alcohol    Types: 3 Cans of beer per week    Comment: occassional beer   Drug use: No    Pertinent Clinical Results:  LABS: Labs reviewed: Acceptable for surgery.  No visits with results within 3 Day(s) from this visit.  Latest known visit with results is:  Hospital Outpatient Visit on 03/15/2022   Component Date Value Ref Range Status   MRSA, PCR 03/15/2022 NEGATIVE  NEGATIVE Final   Staphylococcus aureus 03/15/2022 NEGATIVE  NEGATIVE Final   Comment: (NOTE) The Xpert SA Assay (FDA approved for NASAL specimens in patients 16 years of age and older), is one component of a comprehensive surveillance program. It is not intended to diagnose infection nor to guide or monitor treatment. Performed at [facility], [address] [place], [place] [address]    WBC 03/15/2022 7.1  4.0 - 10.5 K/uL Final   RBC 03/15/2022 5.07  4.22 - 5.81 MIL/uL Final   Hemoglobin 03/15/2022 15.0  13.0 - 17.0 g/dL Final   HCT 03/15/2022 45.6  39.0 - 52.0 % Final   MCV 03/15/2022 89.9  80.0 - 100.0 fL Final   MCH 03/15/2022 29.6  26.0 - 34.0 pg Final   MCHC 03/15/2022 32.9  30.0 - 36.0 g/dL Final   RDW 03/15/2022 11.7  11.5 - 15.5 % Final   Platelets 03/15/2022 214  150 - 400 K/uL Final   nRBC 03/15/2022 0.0  0.0 - 0.2 % Final   Neutrophils Relative % 03/15/2022 52  % Final   Neutro Abs 03/15/2022 3.7  1.7 - 7.7 K/uL Final   Lymphocytes Relative 03/15/2022 37  % Final   Lymphs Abs 03/15/2022 2.6  0.7 - 4.0 K/uL Final   Monocytes Relative 03/15/2022 9  % Final   Monocytes Absolute 03/15/2022 0.6  0.1 - 1.0 K/uL Final   Eosinophils Relative 03/15/2022 1  % Final   Eosinophils Absolute 03/15/2022 0.1  0.0 - 0.5 K/uL Final   Basophils Relative 03/15/2022 1  % Final   Basophils Absolute 03/15/2022 0.1  0.0 - 0.1 K/uL Final   Immature Granulocytes 03/15/2022 0  % Final   Abs Immature Granulocytes 03/15/2022 0.02  0.00 - 0.07 K/uL Final   Performed at [facility], [address] [place], [place] [address]   Sodium 03/15/2022 140  135 - 145 mmol/L Final   Potassium 03/15/2022 3.8  3.5 - 5.1 mmol/L Final   Chloride 03/15/2022 104  98 - 111 mmol/L Final   CO2 03/15/2022 28  22 - 32 mmol/L Final   Glucose, Bld 03/15/2022 85  70 - 99 mg/dL Final   Glucose reference range applies only to  samples taken after fasting for at least 8 hours.   BUN 03/15/2022 15  8 - 23 mg/dL Final   Creatinine, Ser 03/15/2022 0.85  0.61 - 1.24 mg/dL Final   Calcium 03/15/2022 9.2  8.9 - 10.3 mg/dL Final  Total Protein 03/15/2022 7.1  6.5 - 8.1 g/dL Final   Albumin 03/15/2022 4.2  3.5 - 5.0 g/dL Final   AST 03/15/2022 29  15 - 41 U/L Final   ALT 03/15/2022 33  0 - 44 U/L Final   Alkaline Phosphatase 03/15/2022 102  38 - 126 U/L Final   Total Bilirubin 03/15/2022 0.7  0.3 - 1.2 mg/dL Final   GFR, Estimated 03/15/2022 >60  >60 mL/min Final   Comment: (NOTE) Calculated using the CKD-EPI Creatinine Equation (2021)    Anion gap 03/15/2022 8  5 - 15 Final   Performed at [facility], [address] [place], [place] [address]   Color, Urine 03/15/2022 STRAW (A)  YELLOW Final   APPearance 03/15/2022 CLEAR (A)  CLEAR Final   Specific Gravity, Urine 03/15/2022 1.006  1.005 - 1.030 Final   pH 03/15/2022 6.0  5.0 - 8.0 Final   Glucose, UA 03/15/2022 NEGATIVE  NEGATIVE mg/dL Final   Hgb urine dipstick 03/15/2022 NEGATIVE  NEGATIVE Final   Bilirubin Urine 03/15/2022 NEGATIVE  NEGATIVE Final   Ketones, ur 03/15/2022 NEGATIVE  NEGATIVE mg/dL Final   Protein, ur 03/15/2022 NEGATIVE  NEGATIVE mg/dL Final   Nitrite 03/15/2022 NEGATIVE  NEGATIVE Final   Leukocytes,Ua 03/15/2022 NEGATIVE  NEGATIVE Final   Performed at [facility], [address] [place], [place] [address]   ABO/RH(D) 03/15/2022 O POS   Final   Antibody Screen 03/15/2022 NEG   Final   Sample Expiration 03/15/2022 03/29/2022,2359   Final   Extend sample reason 03/15/2022    Final                   Value:NO TRANSFUSIONS OR PREGNANCY IN THE PAST 3 MONTHS Performed at [facility], [address] [place], [place] [address]     ECG: Date: 03/15/2022 Time ECG obtained: 1405 PM Rate: 55 bpm Rhythm:  Sinus bradycardia with short PR Axis (leads I and aVF): Normal Intervals: PR 108 ms. QRS 88 ms. QTc 4 3  ms. ST segment and T wave changes: Nonacute T wave abnormality in lead III.   Comparison: Similar to previous tracing obtained on 08/02/2021   IMAGING / PROCEDURES: DIAGNOSTIC RADIOGRAPHS OF LEFT KNEE 3 VIEWS performed on 10/21/2021 Severe degenerative changes, primarily involving the medial compartment with 100% medial joint space narrowing.   Lesser degenerative changes of the lateral and patellofemoral compartments also are noted.   Overall alignment is moderate varus.   No fractures, lytic lesions, or abnormal calcifications are noted.   MR CARD MORPHOLOGY WO/W CM performed on 02/04/2015 Abnormal delayed enhancement with evidence of prior MI in the LCx/RCA territory.  There is subendocardial infarct and patchy scar to the basal to mid inferior and inferolateral walls in close proximity to the posteromedial papillary muscle.  Some portions of the scar appear transmural or near transmural. Normal left ventricular systolic function with an EF of 66.7% The right ventricle is normal in size with normal systolic function Dilated left atrium Normal rest perfusion  LEFT HEART CATHETERIZATION AND CORONARY ANGIOGRAPHY performed on 10/20/2014 Diffuse stenosis of the RCA from the ostium to the mid vessel of up to 80-90%.  Successful PCI to the ostial/proximal RCA and mid RCA with placement of overlapping Synergy 3.0 x 32 mm (proximal) and Synergy 2.5 x 38 mm (distal) drug eluting stents with excellent angiographic result and TIMI 3 flow. Recommendations:  Aggressive secondary prevention.  Dual antiplatelet therapy for at least 12 months, ideally longer.  TRANSTHORACIC ECHOCARDIOGRAM performed on 10/17/2014 Normal left ventricular systolic function with an EF of 55% Hypokinesis of the basal posterolateral wall Normal left ventricular diastolic Doppler parameters Normal right ventricular systolic function Mild mitral valve regurgitation Trace tricuspid valve regurgitation Mildly elevated PASP  = 36 mmHg Normal transvalvular gradients; no valvular stenosis No pericardial effusion  Impression and Plan:  Jaramiah Bossard Rowzee has been referred for pre-anesthesia review and clearance prior to him undergoing the planned anesthetic and procedural courses. Available labs, pertinent testing, and imaging results were personally reviewed by me. This patient has been appropriately cleared by cardiology with an overall ACCEPTABLE risk of significant perioperative cardiovascular complications.  Based on clinical review performed today (03/21/22), barring any significant acute changes in the patient's overall condition, it is anticipated that he will be able to proceed with the planned surgical intervention. Any acute changes in clinical condition may necessitate his procedure being postponed and/or cancelled. Patient will meet with anesthesia team (MD and/or CRNA) on the day of his procedure for preoperative evaluation/assessment. Questions regarding anesthetic course will be fielded at that time.   Pre-surgical instructions were reviewed with the patient during his PAT appointment and questions were fielded by PAT clinical staff. Patient was advised that if any questions or concerns arise prior to his procedure then he should return a call to PAT and/or his surgeon's office to discuss.  [name], MSN, APRN, FNP-C, CEN [facility]  Peri-operative Services Nurse Practitioner Phone: [phone] Fax: [phone] 03/21/22 3:12 PM  NOTE: This note has been prepared using Dragon dictation software. Despite my best ability to proofread, there is always the potential that unintentional transcriptional errors may still occur from this process.

## 2022-03-17 NOTE — Telephone Encounter (Signed)
Pt has been scheduled for tele pre op appt 03/21/22 @ 1:40. Add on due to med hold and date of procedure.

## 2022-03-21 ENCOUNTER — Encounter: Payer: Self-pay | Admitting: Surgery

## 2022-03-21 ENCOUNTER — Encounter: Payer: Self-pay | Admitting: Cardiology

## 2022-03-21 ENCOUNTER — Ambulatory Visit: Payer: PPO | Attending: Cardiovascular Disease | Admitting: Cardiology

## 2022-03-21 DIAGNOSIS — Z01810 Encounter for preprocedural cardiovascular examination: Secondary | ICD-10-CM | POA: Diagnosis not present

## 2022-03-21 NOTE — Progress Notes (Signed)
Virtual Visit via Telephone Note   Because of Kevin Moore's co-morbid illnesses, he is at least at moderate risk for complications without adequate follow up.  This format is felt to be most appropriate for this patient at this time.  The patient did not have access to video technology/had technical difficulties with video requiring transitioning to audio format only (telephone).  All issues noted in this document were discussed and addressed.  No physical exam could be performed with this format.  Please refer to the patient's chart for his consent to telehealth for [facility].  Evaluation Performed:  Preoperative cardiovascular risk assessment for left total knee arthroplasty with Dr. Milagros Evener on 03/28/21.  _____________   Date:  03/21/2022   Patient ID:  Kevin Moore, DOB Mar 20, 1951, MRN [id number] Patient Location:  Home Provider location:   Office  Primary Care Provider:  Burnard Hawthorne, FNP Primary Cardiologist:  Ida Rogue, MD  Chief Complaint / Patient Profile   71 y.o. y/o male with a h/o CAD with STEMI in 2016 s/p DES to LCX , DES to RCA x 2, VF in setting of STEMI 2016, HLD, HTN, OA, who is pending left total knee arthroplasty with Dr. Milagros Evener on 03/28/21, and presents today for telephonic preoperative cardiovascular risk assessment.  History of Present Illness    Kevin Moore is a 71 y.o. male who presents via audio conferencing for a telehealth visit today.  Pt was last seen in cardiology clinic on 08/02/21 by Dr. Rockey Situ. At that time Kevin Moore was doing well.  The patient is now pending procedure as outlined above. Since his last visit, He denies chest pain, palpitations, dyspnea, pnd, orthopnea, n, v, dizziness, syncope, edema, weight gain, or early satiety.   Past Medical History    Past Medical History:  Diagnosis Date   Acute ST elevation myocardial infarction (STEMI) of inferior wall ([place]) 10/17/2014   a.) LHC  10/17/2014: <50% LAD, 50-60% prox portion of mid diag, 50% pLCx, 99% mid major marg, 80-90% prox 2/3s RCA - during cath, spontaneous occ of subtotal LCx caused VF (defib 200J x 2). PCI of LCx (2.5 x 12 mm Xience Alpine). Started on amio gtt (bradycardia). Started on dopamine (SOB - d/c'd). Post-cath, dev WCT/polymorphic VT. EP consulted and lidocaine gtt started. Recommended against AICD.   Beta-blocker intolerance    a.) causes profound bradycardia   Coronary artery disease    a.) LHC 10/17/2014: <50% LAD, 50-60% prox portion of mid diagonal, 50% pLCx, 99% mid major marginal, 80-90% proximal 2/3s RCA  --> PCI performed placing a 2.5 x 12 mm Xience Alpine DES x 1 mLCx.; b.) PCI 10/20/2014: 80-90% o-mRCA --> 3.0 x 32 mm (proximal) and 2.5 x 38 mm (distal) Synergy DES placed.   GERD (gastroesophageal reflux disease)    History of colonic polyps    HLD (hyperlipidemia)    Hypertension    Long term current use of antithrombotics/antiplatelets    a.) clopidogrel   Melanoma ([place])    Osteoarthritis    Prostatitis    Ventricular fibrillation ([place]) 10/16/2014   a.) in setting of STEMI 10/16/2014 --> during cath, spontaneous occlusion of subtotal LCx caused VF requring defib 200J x 2. PCI of LCx (2.5 x 12 mm Xience Alpine DES). Started on amiodarone gtt, which caused bradycardia. Started on dopamine, which caused SOB (d/c'd). Post-cath, developed WCT/polymorphic VT. EP consulted and lidocaine gtt started. Recommendation was to forego AICD placement.   Past Surgical  History:  Procedure Laterality Date   COLON SURGERY     partial removal of colon with perforation due to suspected fish bone.   CORONARY ANGIOPLASTY WITH STENT PLACEMENT  10/17/2014   Procedure: CORONARY ANGIOPLASTY WITH STENT PLACEMENT (2.5 x 12 mm Xience Alpine to [employer]); Location: [facility]   CORONARY STENT INTERVENTION Left 10/20/2014   Procedure: CORONARY STENT INTERVENTION (3.0 x 32 mm Synergy DES pRCA, 2.5 x 38 mm Synergy DES dRCA);  Location: [facility]   ESOPHAGEAL DILATION     KNEE ARTHROSCOPY Left    TONSILLECTOMY      Allergies  Allergies  Allergen Reactions   Beta Adrenergic Blockers Other (See Comments)    Heart rate drops into the 20s if he takes beta blockers.    Home Medications    Prior to Admission medications   Medication Sig Start Date End Date Taking? Authorizing Provider  Ascorbic Acid (VITAMIN C WITH ROSE HIPS) 500 MG tablet Take 500 mg by mouth daily.    [provider]  Cholecalciferol 25 MCG (1000 UT) capsule Take 1,000 Units by mouth daily.    [provider]  clopidogrel (PLAVIX) 75 MG tablet TAKE 1 TABLET BY MOUTH DAILY 08/08/21   Minna Merritts, MD  doxycycline (VIBRA-TABS) 100 MG tablet Take 1 tablet (100 mg total) by mouth 2 (two) times daily. 03/09/22   Copland, Frederico Hamman, MD  ezetimibe (ZETIA) 10 MG tablet TAKE 1 TABLET BY MOUTH DAILY 08/08/21   Minna Merritts, MD  ibuprofen (ADVIL) 200 MG tablet Take 200 mg by mouth every 6 (six) hours as needed for mild pain. 2 tablets    [provider]  Magnesium 250 MG TABS Take 1 tablet by mouth daily.    [provider]  omeprazole (PRILOSEC) 20 MG capsule Take 1 capsule (20 mg total) by mouth daily. 08/09/21   Minna Merritts, MD  rosuvastatin (CRESTOR) 40 MG tablet TAKE 1 TABLET BY MOUTH DAILY 08/08/21   Minna Merritts, MD  silodosin (RAPAFLO) 8 MG CAPS capsule Take 1 capsule (8 mg total) by mouth daily with breakfast. 02/02/22   Stoioff, Ronda Fairly, MD  telmisartan-hydrochlorothiazide (MICARDIS HCT) 40-12.5 MG tablet TAKE 1 TABLET BY MOUTH DAILY 08/08/21   Minna Merritts, MD  zinc gluconate 50 MG tablet Take 50 mg by mouth daily.    [provider]    Physical Exam   Given telephonic nature of communication, physical exam is limited. AAOx3. NAD. Normal affect.  Speech and respirations are unlabored.  Accessory Clinical Findings    None  Assessment & Plan    1.  Preoperative Cardiovascular  Risk Assessment:  The patient was advised that if he develops new symptoms prior to surgery to contact our office to arrange for a follow-up visit, and he verbalized understanding.   According to the Revised Cardiac Risk Index (RCRI), his Perioperative Risk of Major Cardiac Event is (%): 0.9  His Functional Capacity in METs is: 8.97 according to the [facility] Activity Status Index (DASI).  Therefore, based on ACC/AHA guidelines, patient would be at acceptable risk for the planned procedure without further cardiovascular testing.   Recommendations: According to ACC/AHA guidelines, no further cardiovascular testing needed.  The patient may proceed to surgery at acceptable risk.     Antiplatelet and/or Anticoagulation Recommendations: Per Dr. Rockey Situ, Clopidogrel (Plavix) can be held for 7 days prior to his surgery and resumed as soon as possible post op. On days he cannot take Plavix, he should ideally take  ASA 81 mg daily. Restart Plavix when cleared by surgical team.     A copy of this note will be routed to requesting surgeon.  Time:   Today, I have spent 8 minutes with the patient with telehealth technology discussing medical history, symptoms, and management plan.     Trudi Ida, NP  03/21/2022, 1:55 PM

## 2022-03-27 DIAGNOSIS — N400 Enlarged prostate without lower urinary tract symptoms: Secondary | ICD-10-CM

## 2022-03-27 HISTORY — DX: Benign prostatic hyperplasia without lower urinary tract symptoms: N40.0

## 2022-03-27 MED ORDER — ORAL CARE MOUTH RINSE: 15.0000 mL | Freq: Once | OROMUCOSAL | Status: AC

## 2022-03-27 MED ORDER — LACTATED RINGERS IV SOLN: INTRAVENOUS | Status: DC

## 2022-03-27 MED ORDER — CEFAZOLIN SODIUM-DEXTROSE 2-4 GM/100ML-% IV SOLN
2.0000 g | INTRAVENOUS | Status: AC
  Administered 2022-03-28: 2 g via INTRAVENOUS

## 2022-03-27 MED ORDER — CHLORHEXIDINE GLUCONATE 0.12 % MT SOLN: 15.0000 mL | Freq: Once | OROMUCOSAL | Status: AC

## 2022-03-28 ENCOUNTER — Ambulatory Visit: Payer: PPO | Admitting: Urgent Care

## 2022-03-28 ENCOUNTER — Encounter: Payer: Self-pay | Admitting: Surgery

## 2022-03-28 ENCOUNTER — Ambulatory Visit: Payer: PPO

## 2022-03-28 ENCOUNTER — Other Ambulatory Visit: Payer: Self-pay

## 2022-03-28 ENCOUNTER — Ambulatory Visit
Admission: RE | Admit: 2022-03-28 | Discharge: 2022-03-28 | Disposition: A | Discharge status: Home / Self Care | Payer: PPO | Attending: Surgery | Admitting: Surgery

## 2022-03-28 ENCOUNTER — Encounter
Admission: RE | Disposition: A | Discharge status: Home / Self Care | Payer: Self-pay | Source: Home / Self Care | Attending: Surgery

## 2022-03-28 DIAGNOSIS — Z7902 Long term (current) use of antithrombotics/antiplatelets: Secondary | ICD-10-CM | POA: Insufficient documentation

## 2022-03-28 DIAGNOSIS — Z79899 Other long term (current) drug therapy: Secondary | ICD-10-CM | POA: Diagnosis not present

## 2022-03-28 DIAGNOSIS — Z955 Presence of coronary angioplasty implant and graft: Secondary | ICD-10-CM | POA: Insufficient documentation

## 2022-03-28 DIAGNOSIS — S83512A Sprain of anterior cruciate ligament of left knee, initial encounter: Secondary | ICD-10-CM | POA: Diagnosis not present

## 2022-03-28 DIAGNOSIS — M1712 Unilateral primary osteoarthritis, left knee: Secondary | ICD-10-CM | POA: Diagnosis present

## 2022-03-28 DIAGNOSIS — Z96652 Presence of left artificial knee joint: Secondary | ICD-10-CM | POA: Diagnosis not present

## 2022-03-28 DIAGNOSIS — M1732 Unilateral post-traumatic osteoarthritis, left knee: Secondary | ICD-10-CM | POA: Insufficient documentation

## 2022-03-28 DIAGNOSIS — I2510 Atherosclerotic heart disease of native coronary artery without angina pectoris: Secondary | ICD-10-CM | POA: Insufficient documentation

## 2022-03-28 DIAGNOSIS — I10 Essential (primary) hypertension: Secondary | ICD-10-CM | POA: Diagnosis not present

## 2022-03-28 DIAGNOSIS — I252 Old myocardial infarction: Secondary | ICD-10-CM | POA: Diagnosis not present

## 2022-03-28 DIAGNOSIS — K219 Gastro-esophageal reflux disease without esophagitis: Secondary | ICD-10-CM | POA: Diagnosis not present

## 2022-03-28 DIAGNOSIS — E785 Hyperlipidemia, unspecified: Secondary | ICD-10-CM | POA: Insufficient documentation

## 2022-03-28 DIAGNOSIS — Z471 Aftercare following joint replacement surgery: Secondary | ICD-10-CM | POA: Diagnosis not present

## 2022-03-28 HISTORY — PX: TOTAL KNEE ARTHROPLASTY: SHX125

## 2022-03-28 SURGERY — ARTHROPLASTY, KNEE, TOTAL
Anesthesia: Spinal | Site: Knee | Laterality: Left

## 2022-03-28 MED ORDER — SODIUM CHLORIDE 0.9 % IR SOLN
Status: DC | PRN
  Administered 2022-03-28: 3000 mL

## 2022-03-28 MED ORDER — KETOROLAC TROMETHAMINE 15 MG/ML IJ SOLN
INTRAMUSCULAR | Status: AC
  Filled 2022-03-28: qty 1

## 2022-03-28 MED ORDER — CEFAZOLIN SODIUM-DEXTROSE 2-4 GM/100ML-% IV SOLN
INTRAVENOUS | Status: AC
  Filled 2022-03-28: qty 100

## 2022-03-28 MED ORDER — BUPIVACAINE LIPOSOME 1.3 % IJ SUSP
INTRAMUSCULAR | Status: AC
  Filled 2022-03-28: qty 20

## 2022-03-28 MED ORDER — BUPIVACAINE-EPINEPHRINE (PF) 0.5% -1:200000 IJ SOLN
INTRAMUSCULAR | Status: AC
  Filled 2022-03-28: qty 30

## 2022-03-28 MED ORDER — FENTANYL CITRATE (PF) 100 MCG/2ML IJ SOLN: 25.0000 ug | INTRAMUSCULAR | Status: DC | PRN

## 2022-03-28 MED ORDER — TRIAMCINOLONE ACETONIDE 40 MG/ML IJ SUSP
INTRAMUSCULAR | Status: AC
  Filled 2022-03-28: qty 2

## 2022-03-28 MED ORDER — CEFAZOLIN SODIUM-DEXTROSE 2-4 GM/100ML-% IV SOLN: 2.0000 g | Freq: Three times a day (TID) | INTRAVENOUS | Status: DC

## 2022-03-28 MED ORDER — TRANEXAMIC ACID 1000 MG/10ML IV SOLN
INTRAVENOUS | Status: DC | PRN
  Administered 2022-03-28: 1000 mg via TOPICAL

## 2022-03-28 MED ORDER — CHLORHEXIDINE GLUCONATE 0.12 % MT SOLN
OROMUCOSAL | Status: AC
  Administered 2022-03-28: 15 mL via OROMUCOSAL
  Filled 2022-03-28: qty 15

## 2022-03-28 MED ORDER — MIDAZOLAM HCL 5 MG/5ML IJ SOLN
INTRAMUSCULAR | Status: DC | PRN
  Administered 2022-03-28: 2 mg via INTRAVENOUS

## 2022-03-28 MED ORDER — 0.9 % SODIUM CHLORIDE (POUR BTL) OPTIME
TOPICAL | Status: DC | PRN
  Administered 2022-03-28: 500 mL

## 2022-03-28 MED ORDER — KETOROLAC TROMETHAMINE 15 MG/ML IJ SOLN
15.0000 mg | Freq: Once | INTRAMUSCULAR | Status: AC
  Administered 2022-03-28: 15 mg via INTRAVENOUS

## 2022-03-28 MED ORDER — TRIAMCINOLONE ACETONIDE 40 MG/ML IJ SUSP
INTRAMUSCULAR | Status: DC | PRN
  Administered 2022-03-28: 93 mL

## 2022-03-28 MED ORDER — PROPOFOL 500 MG/50ML IV EMUL
INTRAVENOUS | Status: DC | PRN
  Administered 2022-03-28: 55 ug/kg/min via INTRAVENOUS

## 2022-03-28 MED ORDER — CEFAZOLIN SODIUM-DEXTROSE 2-4 GM/100ML-% IV SOLN
INTRAVENOUS | Status: AC
  Administered 2022-03-28: 2 g via INTRAVENOUS
  Filled 2022-03-28: qty 100

## 2022-03-28 MED ORDER — SODIUM CHLORIDE FLUSH 0.9 % IV SOLN
INTRAVENOUS | Status: AC
  Filled 2022-03-28: qty 40

## 2022-03-28 MED ORDER — TRANEXAMIC ACID 1000 MG/10ML IV SOLN
INTRAVENOUS | Status: AC
  Filled 2022-03-28: qty 10

## 2022-03-28 MED ORDER — PROPOFOL 1000 MG/100ML IV EMUL
INTRAVENOUS | Status: AC
  Filled 2022-03-28: qty 100

## 2022-03-28 MED ORDER — SODIUM CHLORIDE 0.9 % BOLUS PEDS
250.0000 mL | Freq: Once | INTRAVENOUS | Status: AC
  Administered 2022-03-28: 250 mL via INTRAVENOUS

## 2022-03-28 MED ORDER — ONDANSETRON HCL 4 MG PO TABS: 4.0000 mg | ORAL_TABLET | Freq: Four times a day (QID) | ORAL | Status: DC | PRN

## 2022-03-28 MED ORDER — METOCLOPRAMIDE HCL 5 MG/ML IJ SOLN: 5.0000 mg | Freq: Three times a day (TID) | INTRAMUSCULAR | Status: DC | PRN

## 2022-03-28 MED ORDER — STERILE WATER FOR IRRIGATION IR SOLN
Status: DC | PRN
  Administered 2022-03-28: 1000 mL

## 2022-03-28 MED ORDER — OXYCODONE HCL 5 MG PO TABS: 5.0000 mg | ORAL_TABLET | ORAL | 0 refills | Status: DC | PRN

## 2022-03-28 MED ORDER — ONDANSETRON HCL 4 MG/2ML IJ SOLN: 4.0000 mg | Freq: Four times a day (QID) | INTRAMUSCULAR | Status: DC | PRN

## 2022-03-28 MED ORDER — PHENYLEPHRINE HCL (PRESSORS) 10 MG/ML IV SOLN
INTRAVENOUS | Status: DC | PRN
  Administered 2022-03-28: 100 ug via INTRAVENOUS

## 2022-03-28 MED ORDER — PHENYLEPHRINE HCL-NACL 20-0.9 MG/250ML-% IV SOLN
INTRAVENOUS | Status: DC | PRN
  Administered 2022-03-28: 50 ug/min via INTRAVENOUS

## 2022-03-28 MED ORDER — OXYCODONE HCL 5 MG PO TABS: 5.0000 mg | ORAL_TABLET | ORAL | Status: DC | PRN

## 2022-03-28 MED ORDER — APIXABAN 2.5 MG PO TABS: 2.5000 mg | ORAL_TABLET | Freq: Two times a day (BID) | ORAL | 0 refills | Status: DC

## 2022-03-28 MED ORDER — KETOROLAC TROMETHAMINE 30 MG/ML IJ SOLN
INTRAMUSCULAR | Status: AC
  Filled 2022-03-28: qty 1

## 2022-03-28 MED ORDER — METOCLOPRAMIDE HCL 10 MG PO TABS: 5.0000 mg | ORAL_TABLET | Freq: Three times a day (TID) | ORAL | Status: DC | PRN

## 2022-03-28 MED ORDER — FENTANYL CITRATE (PF) 100 MCG/2ML IJ SOLN
INTRAMUSCULAR | Status: AC
  Filled 2022-03-28: qty 2

## 2022-03-28 MED ORDER — PHENYLEPHRINE HCL (PRESSORS) 10 MG/ML IV SOLN
INTRAVENOUS | Status: AC
  Filled 2022-03-28: qty 1

## 2022-03-28 MED ORDER — SODIUM CHLORIDE 0.9 % IV SOLN: INTRAVENOUS | Status: DC

## 2022-03-28 MED ORDER — ONDANSETRON HCL 4 MG/2ML IJ SOLN: 4.0000 mg | Freq: Once | INTRAMUSCULAR | Status: DC | PRN

## 2022-03-28 MED ORDER — EPHEDRINE SULFATE (PRESSORS) 50 MG/ML IJ SOLN
INTRAMUSCULAR | Status: DC | PRN
  Administered 2022-03-28: 2.5 mg via INTRAVENOUS

## 2022-03-28 MED ORDER — MIDAZOLAM HCL 2 MG/2ML IJ SOLN
INTRAMUSCULAR | Status: AC
  Filled 2022-03-28: qty 2

## 2022-03-28 SURGICAL SUPPLY — 63 items
APL PRP STRL LF DISP 70% ISPRP (MISCELLANEOUS) ×1
BIT DRILL QUICK REL 1/8 2PK SL (DRILL) IMPLANT
BLADE SAW SAG 25X90X1.19 (BLADE) ×1 IMPLANT
BLADE SURG SZ20 CARB STEEL (BLADE) ×1 IMPLANT
BNDG CMPR STD VLCR NS LF 5.8X6 (GAUZE/BANDAGES/DRESSINGS) ×1
BNDG ELASTIC 6X5.8 VLCR NS LF (GAUZE/BANDAGES/DRESSINGS) ×1 IMPLANT
BRNG TIB 0D 83X16 ANT STAB (Insert) ×1 IMPLANT
CEMENT BONE R 1X40 (Cement) ×2 IMPLANT
CEMENT VACUUM MIXING SYSTEM (MISCELLANEOUS) ×1 IMPLANT
CHLORAPREP W/TINT 26 (MISCELLANEOUS) ×1 IMPLANT
COOLER POLAR GLACIER W/PUMP (MISCELLANEOUS) ×1 IMPLANT
COVER MAYO STAND REUSABLE (DRAPES) ×1 IMPLANT
CUFF TOURN SGL QUICK 24 (TOURNIQUET CUFF)
CUFF TOURN SGL QUICK 34 (TOURNIQUET CUFF)
CUFF TRNQT CYL 24X4X16.5-23 (TOURNIQUET CUFF) IMPLANT
CUFF TRNQT CYL 34X4.125X (TOURNIQUET CUFF) IMPLANT
DRAPE 3/4 80X56 (DRAPES) ×1 IMPLANT
DRAPE IMP U-DRAPE 54X76 (DRAPES) ×1 IMPLANT
DRAPE U-SHAPE 47X51 STRL (DRAPES) ×1 IMPLANT
DRILL QUICK RELEASE 1/8 INCH (DRILL) ×3
DRSG MEPILEX SACRM 8.7X9.8 (GAUZE/BANDAGES/DRESSINGS) IMPLANT
DRSG OPSITE POSTOP 4X10 (GAUZE/BANDAGES/DRESSINGS) ×1 IMPLANT
DRSG OPSITE POSTOP 4X8 (GAUZE/BANDAGES/DRESSINGS) ×1 IMPLANT
ELECT REM PT RETURN 9FT ADLT (ELECTROSURGICAL) ×1
ELECTRODE REM PT RTRN 9FT ADLT (ELECTROSURGICAL) ×1 IMPLANT
FEMORAL CR LEFT 75 (Joint) IMPLANT
GAUZE XEROFORM 1X8 LF (GAUZE/BANDAGES/DRESSINGS) ×1 IMPLANT
GLOVE BIO SURGEON STRL SZ7.5 (GLOVE) ×4 IMPLANT
GLOVE BIO SURGEON STRL SZ8 (GLOVE) ×4 IMPLANT
GLOVE BIOGEL PI IND STRL 8 (GLOVE) ×1 IMPLANT
GLOVE SURG UNDER LTX SZ8 (GLOVE) ×1 IMPLANT
GOWN STRL REUS W/ TWL LRG LVL3 (GOWN DISPOSABLE) ×1 IMPLANT
GOWN STRL REUS W/ TWL XL LVL3 (GOWN DISPOSABLE) ×1 IMPLANT
GOWN STRL REUS W/TWL LRG LVL3 (GOWN DISPOSABLE) ×1
GOWN STRL REUS W/TWL XL LVL3 (GOWN DISPOSABLE) ×1
HANDLE YANKAUER SUCT OPEN TIP (MISCELLANEOUS) ×1 IMPLANT
HOOD PEEL AWAY T7 (MISCELLANEOUS) ×3 IMPLANT
INSERT TIB BEARING 83X16 (Insert) IMPLANT
IV NS IRRIG 3000ML ARTHROMATIC (IV SOLUTION) ×1 IMPLANT
KIT TURNOVER KIT A (KITS) ×1 IMPLANT
MANIFOLD NEPTUNE II (INSTRUMENTS) ×1 IMPLANT
NDL SPNL 20GX3.5 QUINCKE YW (NEEDLE) ×1 IMPLANT
NEEDLE SPNL 20GX3.5 QUINCKE YW (NEEDLE) ×1 IMPLANT
NS IRRIG 1000ML POUR BTL (IV SOLUTION) ×1 IMPLANT
PACK TOTAL KNEE (MISCELLANEOUS) ×1 IMPLANT
PAD WRAPON POLAR KNEE (MISCELLANEOUS) ×1 IMPLANT
PEG PATELLA SERIES A 37MMX10MM (Orthopedic Implant) IMPLANT
PENCIL SMOKE EVACUATOR (MISCELLANEOUS) ×1 IMPLANT
PLATE TIBIAL INTERLOK 83 (Plate) IMPLANT
PULSAVAC PLUS IRRIG FAN TIP (DISPOSABLE) ×1
STAPLER SKIN PROX 35W (STAPLE) ×1 IMPLANT
SUCTION FRAZIER HANDLE 10FR (MISCELLANEOUS) ×1
SUCTION TUBE FRAZIER 10FR DISP (MISCELLANEOUS) ×1 IMPLANT
SUT VIC AB 0 CT1 36 (SUTURE) ×3 IMPLANT
SUT VIC AB 2-0 CT1 27 (SUTURE) ×3
SUT VIC AB 2-0 CT1 TAPERPNT 27 (SUTURE) ×3 IMPLANT
SYR 10ML LL (SYRINGE) ×1 IMPLANT
SYR 20ML LL LF (SYRINGE) ×1 IMPLANT
SYR 30ML LL (SYRINGE) IMPLANT
TIP FAN IRRIG PULSAVAC PLUS (DISPOSABLE) ×1 IMPLANT
TRAP FLUID SMOKE EVACUATOR (MISCELLANEOUS) ×2 IMPLANT
WATER STERILE IRR 500ML POUR (IV SOLUTION) ×1 IMPLANT
WRAPON POLAR PAD KNEE (MISCELLANEOUS) ×1

## 2022-03-28 NOTE — Discharge Instructions (Addendum)
Orthopedic discharge instructions: May shower with intact OpSite dressing. Apply ice frequently to knee or use Polar Care. Start Eliquis 1 tablet (2.5 mg) twice daily on Wednesday, 03/29/2022, for 2 weeks, then resume Plavix. Take pain medication as prescribed when needed.  May supplement with ES Tylenol if necessary. May weight-bear as tolerated on right leg - use walker for balance and support. Follow-up in 10-14 days or as scheduled     .AMBULATORY SURGERY  DISCHARGE INSTRUCTIONS   The drugs that you were given will stay in your system until tomorrow so for the next 24 hours you should not:  Drive an automobile Make any legal decisions Drink any alcoholic beverage   You may resume regular meals tomorrow.  Today it is better to start with liquids and gradually work up to solid foods.  You may eat anything you prefer, but it is better to start with liquids, then soup and crackers, and gradually work up to solid foods.   Please notify your doctor immediately if you have any unusual bleeding, trouble breathing, redness and pain at the surgery site, drainage, fever, or pain not relieved by medication.     Your post-operative visit with Dr.                                       is: Date:                        Time:    Please call to schedule your post-operative visit.  Additional Instructions: PLEASE DO NOT REMOVE GREEN ARMBAND FOR 4 DAYS  POLAR CARE INFORMATION  [email or web address]  How to use [place] Cold Therapy System?  YouTube   [email or web address]  OPERATING INSTRUCTIONS  Start the product With dry hands, connect the transformer to the electrical connection located on the top of the cooler. Next, plug the transformer into an appropriate electrical outlet. The unit will automatically start running at this point.  To stop the pump, disconnect electrical power.  Unplug to stop the product when not in use. Unplugging the Polar Care  unit turns it off. Always unplug immediately after use. Never leave it plugged in while unattended. Remove pad.    FIRST ADD WATER TO FILL LINE, THEN ICE---Replace ice when existing ice is almost melted  1 Discuss Treatment with your [facility] Practitioner and Use Only as Prescribed 2 Apply Insulation Barrier & Cold Therapy Pad 3 Check for Moisture 4 Inspect Skin Regularly  Tips and Trouble Shooting Usage Tips 1. Use cubed or chunked ice for optimal performance. 2. It is recommended to drain the Pad between uses. To drain the pad, hold the Pad upright with the hose pointed toward the ground. Depress the black plunger and allow water to drain out. 3. You may disconnect the Pad from the unit without removing the pad from the affected area by depressing the silver tabs on the hose coupling and gently pulling the hoses apart. The Pad and unit will seal itself and will not leak. Note: Some dripping during release is normal. 4. DO NOT RUN PUMP WITHOUT WATER! The pump in this unit is designed to run with water. Running the unit without water will cause permanent damage to the pump. 5. Unplug unit before removing lid.  TROUBLESHOOTING GUIDE Pump not running, Water not flowing to the pad, Pad is not getting cold 1.  Make sure the transformer is plugged into the wall outlet. 2. Confirm that the ice and water are filled to the indicated levels. 3. Make sure there are no kinks in the pad. 4. Gently pull on the blue tube to make sure the tube/pad junction is straight. 5. Remove the pad from the treatment site and ll it while the pad is lying at; then reapply. 6. Confirm that the pad couplings are securely attached to the unit. Listen for the double clicks (Figure 1) to confirm the pad couplings are securely attached.  Leaks    Note: Some condensation on the lines, controller, and pads is unavoidable, especially in warmer climates. 1. If using a [place] Polar Care Cold Therapy unit with a  detachable Cold Therapy Pad, and a leak exists (other than condensation on the lines) disconnect the pad couplings. Make sure the silver tabs on the couplings are depressed before reconnecting the pad to the pump hose; then confirm both sides of the coupling are properly clicked in. 2. If the coupling continues to leak or a leak is detected in the pad itself, stop using it and call [facility] at [phone].  Cleaning After use, empty and dry the unit with a soft cloth. Warm water and mild detergent may be used occasionally to clean the pump and tubes.  WARNING: The [place] can be cold enough to cause serious injury, including full skin necrosis. Follow these Operating Instructions, and carefully read the Product Insert (see pouch on side of unit) and the Cold Therapy Pad Fitting Instructions (provided with each Cold Therapy Pad) prior to use.

## 2022-03-28 NOTE — H&P (Signed)
History of Present Illness:  Kevin Moore is a 72 y.o. male that presents to clinic today for his preoperative history and evaluation. Patient presents with his wife. The patient is scheduled to undergo a left total knee arthroplasty on 03/28/22 by Dr. Roland Rack. His pain has been present for many years and initially began after an ACL tear while playing basketball. Pain is gotten progressively worse over the years. The pain is located along the medial and anterior aspects of the knee. He describes his pain as worse with weightbearing, specifically ascending/descending stairs or slopes. He reports associated swelling and giving way of the knee. He denies associated numbness or tingling, denies locking.   The patient's symptoms have progressed to the point that they decrease his quality of life. The patient has previously undergone conservative treatment including NSAIDS and injections to the knee without adequate control of his symptoms.  Denies history of lumbar surgery, DVT. He has previously undergone RCA stenting by Dr Kalman Shan in [place]. Has received cardiac clearance.   No pertinent drug allergies.  Past Medical History:  Acid reflux  Coronary artery disease  Heart attack (CMS-HCC)  Hyperlipidemia  Hypertension   Past Surgical History:  COLONOSCOPY 09/11/2011 ([place] Adenomatous Polyps: CBF 08/2016; Recall Ltr mailed 07/28/2016 (dw))  Coronary artery stent placement 03/06/2016  Colon surgery  COLONOSCOPY 01/27/2004, 11/28/2002 (Adenomatous Polyps)  EGD 11/28/2002, 05/31/2001  KNEE ARTHROSCOPY Left  TONSILLECTOMY   Current Medications:  atorvastatin (LIPITOR) 40 MG tablet Take 40 mg by mouth once daily  cholecalciferol (VITAMIN D3) 1000 unit capsule Take 1,000 Units by mouth once daily  clopidogrel (PLAVIX) 75 mg tablet Take 75 mg by mouth once daily  ezetimibe (ZETIA) 10 mg tablet Take 1 tablet by mouth once daily  magnesium oxide (MAG-OX) 400 mg (241.3 mg magnesium) tablet TAKE ONE (1)  TABLET EACH DAY  omeprazole (PRILOSEC) 20 MG DR capsule Take 20 mg by mouth once daily  rosuvastatin (CRESTOR) 40 MG tablet  silodosin (RAPAFLO) 8 mg capsule Take 8 mg by mouth daily with breakfast  telmisartan-hydrochlorothiazide (MICARDIS HCT) 40-12.5 mg tablet Take 1 tablet by mouth once daily  zinc gluconate 50 mg tablet Take 50 mg by mouth once daily   Allergies:  Beta-Blockers (Beta-Adrenergic Blocking Agts) Other (See Comments)  Heart rate drops into the 20s if he takes beta blockers.   Social History:   Socioeconomic History:  Marital status: Married  Tobacco Use  Smoking status: Never  Smokeless tobacco: Never  Vaping Use  Vaping Use: Never used  Substance and Sexual Activity  Alcohol use: Yes  Drug use: Never  Sexual activity: Yes  Partners: Female   Family History:  Breast cancer Mother  Hyperlipidemia (Elevated cholesterol) Mother  Heart disease Father  Epilepsy Son   Review of Systems:   A 10+ ROS was performed, reviewed, and the pertinent orthopaedic findings are documented in the HPI.   Physical Examination:  BP 136/86  Ht 185.4 cm ('6\' 1"'$ )  Wt 100.5 kg (221 lb 9.6 oz)  BMI 29.24 kg/m   Patient is a well-developed, well-nourished male in no acute distress. Patient has normal mood and affect. Patient is alert and oriented to person, place, and time.   HEENT: Atraumatic, normocephalic. Pupils equal and reactive to light. Extraocular motion intact. Noninjected sclera.  Cardiovascular: Regular rate and rhythm, with no murmurs, rubs, or gallops. Distal pulses palpable.  Respiratory: Lungs clear to auscultation bilaterally.   Left knee exam: GAIT: normal and uses no assistive devices. ALIGNMENT: significant varus >  10 degrees SKIN: Notable for well-healed arthroscopic portal sites, otherwise unremarkable. SWELLING: minimal EFFUSION: trace WARMTH: no warmth TENDERNESS: mild over the medial joint line ROM: 0/0/120 McMURRAY'S: equivocal PATELLOFEMORAL:  normal tracking with no peri-patellar tenderness and negative apprehension sign CREPITUS: no LACHMAN'S: 1+ PIVOT SHIFT: Not evaluated ANTERIOR DRAWER: trace positive POSTERIOR DRAWER: negative VARUS/VALGUS: positive pseudolaxity to varus stressing  The patient is able to plantarflex and dorsiflex his left ankle. He also is able to flex and extend the toes on his left foot. Sensation intact over the saphenous, lateral sural cutaneous, superficial fibular, and deep fibular nerve distributions.  X-rays: Previous films were reviewed and showed severe loss of medial compartment joint space with osteophyte formation and subchondral sclerosis.  Impression:  1. Post-traumatic osteoarthritis of left knee.  Plan:  The treatment options were discussed with the patient and his wife. In addition, patient educational materials were provided regarding the diagnosis and treatment options. The patient is quite frustrated by his symptoms and function limitations, and is ready to consider more aggressive treatment options. Therefore, I have recommended a surgical procedure, specifically a left total knee arthroplasty. The procedure was discussed with the patient, as were the potential risks (including bleeding, infection, nerve and/or blood vessel injury, persistent or recurrent pain, loosening and/or failure of the components, dislocation, need for further surgery, blood clots, strokes, heart attacks and/or arhythmias, pneumonia, etc.) and benefits. The patient states his understanding and wishes to proceed. All of the patient's questions and concerns were answered. He can call any time with further concerns. He will return to work without restrictions. He will follow up post-surgery, routine.     H&P reviewed and patient re-examined. No changes.

## 2022-03-28 NOTE — Anesthesia Procedure Notes (Signed)
Spinal  Patient location during procedure: OR Start time: 03/28/2022 7:30 AM End time: 03/28/2022 7:38 AM Reason for block: surgical anesthesia Staffing Performed: resident/CRNA  Anesthesiologist: Molli Barrows, MD Resident/CRNA: Fredderick Phenix, CRNA Performed by: Fredderick Phenix, CRNA Authorized by: Molli Barrows, MD   Preanesthetic Checklist Completed: patient identified, IV checked, site marked, risks and benefits discussed, surgical consent, monitors and equipment checked, pre-op evaluation and timeout performed Spinal Block Patient position: sitting Prep: DuraPrep Patient monitoring: heart rate, cardiac monitor, continuous pulse ox and blood pressure Approach: midline Location: L3-4 Injection technique: single-shot Needle Needle type: Sprotte  Needle gauge: 24 G Needle length: 9 cm Assessment Sensory level: T4 Events: CSF return

## 2022-03-28 NOTE — Evaluation (Signed)
Physical Therapy Evaluation Patient Details Name: Kevin Moore MRN: [id number] DOB: [birth year] Today's Date: 03/28/2022  History of Present Illness  Pt is a 72 y.o. male s/p L TKA secondary to DJD 03/28/22.  PMH includes htn, CAD, MI, cardiac stents, melanoma.  Clinical Impression  Prior to surgery, pt was independent with functional mobility; lives with his wife in 1 level home with steps to enter.  During session pt noted with significantly impaired L ankle DF strength--presenting like L foot drop with activities (MD Poggi came during session and notified--MD reporting it may take 2-3 days for strength to return).  Currently pt is modified independent semi-supine to sitting edge of bed; CGA progressing to SBA with transfers using RW; CGA progressing to SBA ambulating 80 feet with RW use; and CGA navigating 4 steps with RW use.  Pt able to safely clear L LE with functional activity during session.  Able to perform L LE SLR independently and L knee AROM 3-90 degrees.  Reviewed home safety and safe car transfer technique: pt and pt's wife verbalizing appropriate understanding.  Pt would benefit from skilled PT to address noted impairments and functional limitations (see below for any additional details).  Upon hospital discharge, pt would benefit from [place].    Recommendations for follow up therapy are one component of a multi-disciplinary discharge planning process, led by the attending physician.  Recommendations may be updated based on patient status, additional functional criteria and insurance authorization.  Follow Up Recommendations Home health PT      Assistance Recommended at Discharge Intermittent Supervision/Assistance  Patient can return home with the following  A little help with walking and/or transfers;A little help with bathing/dressing/bathroom;Assistance with cooking/housework;Assist for transportation;Help with stairs or ramp for entrance    Equipment Recommendations Rolling  walker (2 wheels);BSC/3in1  Recommendations for Other Services       Functional Status Assessment Patient has had a recent decline in their functional status and demonstrates the ability to make significant improvements in function in a reasonable and predictable amount of time.     Precautions / Restrictions Precautions Precautions: Fall;Knee Precaution Booklet Issued: Yes (comment) Restrictions Weight Bearing Restrictions: Yes LLE Weight Bearing: Weight bearing as tolerated      Mobility  Bed Mobility Overal bed mobility: Modified Independent             General bed mobility comments: Semi-supine to sitting with mild increased effort to perform on own    Transfers Overall transfer level: Needs assistance Equipment used: Rolling walker (2 wheels) Transfers: Sit to/from Stand, Bed to chair/wheelchair/BSC Sit to Stand: Supervision   Step pivot transfers: Min guard       General transfer comment: x1 trial from bed and x 2 trials from recliner (sit to stand); inital vc's for UE/LE placement and then pt able to perform safely on own; stand step turn bed to recliner with RW use    Ambulation/Gait Ambulation/Gait assistance: Min guard, Supervision Gait Distance (Feet): 80 Feet Assistive device: Rolling walker (2 wheels)   Gait velocity: decreased     General Gait Details: mild decreased stance time L LE; step to gait pattern; initial vc's for clearing L foot, L LE positioning when advancing L LE, and overall positioning within walker; increased L hip flexion to advance L LE d/t L DF weakness  Stairs Stairs: Yes Stairs assistance: Min guard Stair Management: Step to pattern, Backwards, Forwards, With walker Number of Stairs: 4 General stair comments: ascended backwards and descended forwards with RW  4 steps; initial vc's for LE sequencing and walker placement; steady and safe  Wheelchair Mobility    Modified Rankin (Stroke Patients Only)       Balance  Overall balance assessment: Needs assistance Sitting-balance support: No upper extremity supported, Feet supported Sitting balance-Leahy Scale: Normal Sitting balance - Comments: steady sitting reaching outside BOS   Standing balance support: No upper extremity supported Standing balance-Leahy Scale: Good Standing balance comment: steady standing reaching within BOS                             Pertinent Vitals/Pain Pain Assessment Pain Assessment: No/denies pain Vitals (HR and O2 on room air) stable throughout treatment session.    Home Living Family/patient expects to be discharged to:: Private residence Living Arrangements: Spouse/significant other Available Help at Discharge: Family Type of Home: House Home Access: Stairs to enter Entrance Stairs-Rails: None Entrance Stairs-Number of Steps: 2 (wall on R side ascending)   Home Layout: One level Home Equipment: Conservation officer, nature (2 wheels);BSC/3in1;Shower seat - built in;Hand held shower head      Prior Function Prior Level of Function : Independent/Modified Independent             Mobility Comments: No AD use prior to surgery.  H/o 1 loss of balance in past 6 months (tripped over stick in woods).[name] Dominance        Extremity/Trunk Assessment   Upper Extremity Assessment Upper Extremity Assessment: Overall WFL for tasks assessed    Lower Extremity Assessment Lower Extremity Assessment: LLE deficits/detail LLE Deficits / Details: DF 2-/5; able to perform L LE SLR independently; at least 3/5 hip flexion    Cervical / Trunk Assessment Cervical / Trunk Assessment: Normal  Communication   Communication: No difficulties  Cognition Arousal/Alertness: Awake/alert Behavior During Therapy: WFL for tasks assessed/performed Overall Cognitive Status: Within Functional Limits for tasks assessed                                          General Comments  Nursing cleared pt for  participation in physical therapy.  Pt agreeable to PT session.  Pt's wife present during session.    Exercises Total Joint Exercises Ankle Circles/Pumps: AROM, Strengthening, Both, 10 reps, Supine (minimal AROM L foot) Quad Sets: AROM, Strengthening, Left, 10 reps, Supine Short Arc Quad: AROM, Strengthening, Left, 10 reps, Supine Heel Slides: AROM, Strengthening, Left, 10 reps, Supine Hip ABduction/ADduction: AROM, Strengthening, Left, 10 reps, Supine Straight Leg Raises: AROM, Strengthening, Left, 10 reps, Supine Long Arc Quad: AROM, Strengthening, Left, 10 reps, Seated Goniometric ROM: L knee AROM 3-90 degrees Other Exercises Other Exercises: 3x15 seconds L knee AROM flexion stretching in sitting   Assessment/Plan    PT Assessment Patient needs continued PT services  PT Problem List Decreased strength;Decreased range of motion;Decreased activity tolerance;Decreased balance;Decreased mobility;Decreased knowledge of use of DME;Decreased knowledge of precautions;Decreased skin integrity;Pain       PT Treatment Interventions DME instruction;Gait training;Stair training;Functional mobility training;Therapeutic activities;Therapeutic exercise;Balance training;Patient/family education    PT Goals (Current goals can be found in the Care Plan section)  Acute Rehab PT Goals Patient Stated Goal: to improve mobility and pain PT Goal Formulation: With patient Time For Goal Achievement: 04/11/22 Potential to Achieve Goals: Good    Frequency BID     Co-evaluation  AM-PAC PT "6 Clicks" Mobility  Outcome Measure Help needed turning from your back to your side while in a flat bed without using bedrails?: None Help needed moving from lying on your back to sitting on the side of a flat bed without using bedrails?: None Help needed moving to and from a bed to a chair (including a wheelchair)?: A Little Help needed standing up from a chair using your arms (e.g., wheelchair  or bedside chair)?: A Little Help needed to walk in hospital room?: A Little Help needed climbing 3-5 steps with a railing? : A Little 6 Click Score: 20    End of Session Equipment Utilized During Treatment: Gait belt Activity Tolerance: Patient tolerated treatment well Patient left: in chair (with pt's nurse and pt's wife present) Nurse Communication: Mobility status;Precautions;Weight bearing status PT Visit Diagnosis: Other abnormalities of gait and mobility (R26.89);Muscle weakness (generalized) (M62.81);Pain Pain - Right/Left: Left Pain - part of body: Knee    Time: 9539-6728 PT Time Calculation (min) (ACUTE ONLY): 57 min   Charges:   PT Evaluation $PT Eval Low Complexity: 1 Low PT Treatments $Gait Training: 8-22 mins $Therapeutic Exercise: 8-22 mins       Leitha Bleak, PT 03/28/22, 3:56 PM

## 2022-03-28 NOTE — Anesthesia Preprocedure Evaluation (Signed)
Anesthesia Evaluation  Patient identified by MRN, date of birth, ID band Patient awake    Reviewed: Allergy & Precautions, H&P , NPO status , Patient's Chart, lab work & pertinent test results, reviewed documented beta blocker date and time   Airway Mallampati: II   Neck ROM: full    Dental  (+) Poor Dentition   Pulmonary neg pulmonary ROS   Pulmonary exam normal        Cardiovascular Exercise Tolerance: Good hypertension, On Medications + CAD, + Past MI and + Cardiac Stents  Normal cardiovascular exam Rhythm:regular Rate:Normal     Neuro/Psych negative neurological ROS  negative psych ROS   GI/Hepatic Neg liver ROS,GERD  Medicated,,  Endo/Other  negative endocrine ROS    Renal/GU negative Renal ROS  negative genitourinary   Musculoskeletal   Abdominal   Peds  Hematology negative hematology ROS (+)   Anesthesia Other Findings Past Medical History: 10/17/2014: Acute ST elevation myocardial infarction (STEMI) of  inferior wall ([place])     Comment:  a.) LHC 10/17/2014: <50% LAD, 50-60% prox mid diag, 50%               pLCx, 99% mid major marg, 80-90% prox 2/3s RCA - during               cath, spontaneous occ of subtotal LCx caused VF (defib               200J x 2). PCI of LCx (2.5 x 12 mm Xience Alpine).               Started on amio gtt (bradycardia). Started on dopamine               (SOB - d/c'd). Post-cath, dev WCT/polymorphic VT. EP               consulted and lidocaine gtt started; b.) PCI of RCA (DES               x2) on 10/20/2014 No date: Beta-blocker intolerance     Comment:  a.) causes profound bradycardia No date: Coronary artery disease     Comment:  a.) LHC 10/17/2014: <50% LAD, 50-60% prox portion of mid              diagonal, 50% pLCx, 99% mid major marginal, 80-90%               proximal 2/3s RCA  --> PCI performed placing a 2.5 x 12               mm Xience Alpine DES x 1 mLCx.; b.) PCI  10/20/2014:               80-90% o-mRCA --> 3.0 x 32 mm (proximal) and 2.5 x 38 mm               (distal) Synergy DES placed. No date: GERD (gastroesophageal reflux disease) No date: History of colonic polyps No date: HLD (hyperlipidemia) No date: Hypertension No date: Long term current use of antithrombotics/antiplatelets     Comment:  a.) clopidogrel No date: Melanoma ([place]) No date: Osteoarthritis 10/17/2014: Polymorphic ventricular tachycardia ([facility]CC)     Comment:  a.) post cardiac catheterization --> developed               WCT/polymorphic VT. EP consulted and lidocaine gtt               started --> felt to be secondary to known RCA  disease.               PCI of RCA recommended; no AICD deemed necessary at that               time. No date: Prostatitis 10/17/2014: Ventricular fibrillation ([place])     Comment:  a.) in setting of STEMI 10/17/2014 --> during cath,               spontaneous occlusion of subtotal LCx caused VF requring               defib 200J x 2. PCI of LCx (2.5 x 12 mm Xience Alpine               DES). Started on amiodarone gtt, which caused               bradycardia. Started on dopamine, which caused SOB               (d/c'd). Past Surgical History: No date: COLON SURGERY     Comment:  partial removal of colon with perforation due to               suspected fish bone. 10/17/2014: CORONARY ANGIOPLASTY WITH STENT PLACEMENT     Comment:  Procedure: CORONARY ANGIOPLASTY WITH STENT PLACEMENT               (2.5 x 12 mm Xience Alpine to [employer]); Location: [facility] 10/20/2014: CORONARY STENT INTERVENTION; Left     Comment:  Procedure: CORONARY STENT INTERVENTION (3.0 x 32 mm               Synergy DES pRCA, 2.5 x 38 mm Synergy DES dRCA);               Location: [facility] No date: ESOPHAGEAL DILATION No date: KNEE ARTHROSCOPY; Left No date: TONSILLECTOMY BMI    Body Mass Index: 28.33 kg/m     Reproductive/Obstetrics negative OB ROS                              Anesthesia Physical Anesthesia Plan  ASA: 3  Anesthesia Plan: Spinal   Post-op Pain Management:    Induction:   PONV Risk Score and Plan:   Airway Management Planned:   Additional Equipment:   Intra-op Plan:   Post-operative Plan:   Informed Consent: I have reviewed the patients History and Physical, chart, labs and discussed the procedure including the risks, benefits and alternatives for the proposed anesthesia with the patient or authorized representative who has indicated his/her understanding and acceptance.     Dental Advisory Given  Plan Discussed with: CRNA  Anesthesia Plan Comments: (Pt is reports being very sensitive to beta blockers. ja)       Anesthesia Quick Evaluation

## 2022-03-28 NOTE — Transfer of Care (Signed)
Immediate Anesthesia Transfer of Care Note  Patient: Kevin Moore  Procedure(s) Performed: TOTAL KNEE ARTHROPLASTY (Left: Knee)  Patient Location: PACU  Anesthesia Type:Spinal  Level of Consciousness: awake and alert   Airway & Oxygen Therapy: Patient Spontanous Breathing and Patient connected to face mask oxygen  Post-op Assessment: Report given to RN and Post -op Vital signs reviewed and stable  Post vital signs: Reviewed and stable  Last Vitals:  Vitals Value Taken Time  BP 100/70 03/28/22 1005  Temp    Pulse 52 03/28/22 1005  Resp 13 03/28/22 1005  SpO2 100 % 03/28/22 1005  Vitals shown include unvalidated device data.  Last Pain:  Vitals:   03/28/22 0616  TempSrc: Temporal  PainSc: 0-No pain         Complications: No notable events documented.

## 2022-03-28 NOTE — Op Note (Signed)
03/28/2022  10:06 AM  Patient:   Kevin Moore  Pre-Op Diagnosis:   Degenerative joint disease, left knee.  Post-Op Diagnosis:   Same  Procedure:   Left TKA using all-cemented [employer] Vanguard system with a 75 mm PCR femur, an  83  mm tibial tray with a 16 mm anterior stabilized E-poly insert, and a 37 x 10 mm all-poly 3-pegged domed patella.  Surgeon:   Pascal Lux, MD  Assistant:   Cameron Proud, PA-C   Anesthesia:   Spinal  Findings:   As above  Complications:   None  EBL:   10 cc  Fluids:   1000 cc crystalloid  UOP:   None  TT:   100 minutes at 300 mmHg  Drains:   None  Closure:   Staples  Implants:   As above  Brief Clinical Note:   The patient is a 72 year old male with a long history of progressively worsening left knee pain. The patient's symptoms have progressed despite medications, activity modification, injections, etc. The patient's history and examination were consistent with advanced degenerative joint disease of the left knee confirmed by plain radiographs. The patient presents at this time for a left total knee arthroplasty.  Procedure:   The patient was brought into the operating room. After adequate spinal anesthesia was obtained, the patient was lain in the supine position before the left lower extremity was prepped with ChloraPrep solution and draped sterilely. Preoperative antibiotics were administered. A timeout was performed to verify the appropriate surgical site before the limb was exsanguinated with an Esmarch and the tourniquet inflated to 300 mmHg.   A standard anterior approach to the knee was made through an approximately 7 inch incision. The incision was carried down through the subcutaneous tissues to expose superficial retinaculum. This was split the length of the incision and the medial flap elevated sufficiently to expose the medial retinaculum. The medial retinaculum was incised, leaving a 3-4 mm cuff of tissue on the patella. This  was extended distally along the medial border of the patellar tendon and proximally through the medial third of the quadriceps tendon. A subtotal fat pad excision was performed before the soft tissues were elevated off the anteromedial and anterolateral aspects of the proximal tibia to the level of the collateral ligaments. The anterior portions of the medial and lateral menisci were removed, as was the anterior cruciate ligament. With the knee flexed to 90, the external tibial guide was positioned and the appropriate proximal tibial cut made. This piece was taken to the back table where it was measured and found to be optimally replicated by an  83  mm component.  Attention was directed to the distal femur. The intramedullary canal was accessed through a 3/8" drill hole. The intramedullary guide was inserted and positioned in order to obtain a neutral flexion gap. The intercondylar block was positioned with care taken to avoid notching the anterior cortex of the femur. The appropriate cut was made. Next, the distal cutting block was placed at 5 of valgus alignment. Using the 9 mm slot, the distal cut was made. The distal femur was measured and found to be optimally replicated by the 75 mm component. The 75 mm 4-in-1 cutting block was positioned and first the posterior, then the posterior chamfer, the anterior chamfer, and finally the anterior cuts were made. At this point, the posterior portions medial and lateral menisci were removed. A trial reduction was performed using the appropriate femoral and tibial components with first  the 12 mm and then the 16 mm insert. The 16 mm insert demonstrated excellent stability to varus and valgus stressing both in flexion and extension while permitting full extension. Patella tracking was assessed and found to be excellent. Therefore, the tibial guide position was marked on the proximal tibia. The patella thickness was measured and found to be 26 mm. Therefore, the  appropriate cut was made. The patellar surface was measured and found to be optimally replicated by the 37 mm component. The three peg holes were drilled in place before the trial button was inserted. Patella tracking was assessed and found to be excellent, passing the "no thumb test". The lug holes were drilled into the distal femur before the trial component was removed, leaving only the tibial tray. The keel was then created using the appropriate tower, reamer, and punch.  The bony surfaces were prepared for cementing by irrigating them thoroughly with sterile saline solution via the jet lavage system. A bone plug was fashioned from some of the bone that had been removed previously and used to plug the distal femoral canal. In addition, 20 cc of Exparel diluted out to 60 cc with normal saline and 30 cc of 0.5% Sensorcaine were injected into the postero-medial and postero-lateral aspects of the knee, the medial and lateral gutter regions, and the peri-incisional tissues to help with postoperative analgesia. Meanwhile, the cement was being mixed on the back table. When it was ready, the tibial tray was cemented in first. The excess cement was removed using [employer] fast streamer. Next, the femoral component was impacted into place. Again, the excess cement was removed using [employer] fast streamer. The 16 mm trial insert was positioned and the knee brought into extension while the cement hardened. Finally, the patella was cemented into place and secured using the patellar clamp. Again, the excess cement was removed using [employer] fast streamer. Once the cement had hardened, the knee was placed through a range of motion with the findings as described above. Therefore, the trial insert was removed and, after verifying that no cement had been retained posteriorly, the permanent 16 mm anterior stabilized E-polyethylene insert was positioned and secured using the appropriate key locking mechanism. Again the knee was placed through a range  of motion with the findings as described above.  The wound was copiously irrigated with sterile saline solution using the jet lavage system before the quadriceps tendon and retinacular layer were reapproximated using #0 Vicryl interrupted sutures. The superficial retinacular layer also was closed using a running #0 Vicryl suture. A total of 10 cc of transexemic acid (TXA) was injected intra-articularly before the subcutaneous tissues were closed in several layers using 2-0 Vicryl interrupted sutures. The skin was closed using staples. A sterile honeycomb dressing was applied to the skin before the leg was wrapped with an Ace wrap to accommodate the Polar Care device. The patient was then awakened and returned to the recovery room in satisfactory condition after tolerating the procedure well.

## 2022-03-28 NOTE — Progress Notes (Cosign Needed)
Patient is not able to walk the distance required to go the bathroom, or he/she is unable to safely negotiate stairs required to access the bathroom.  A 3in1 BSC will alleviate this problem  

## 2022-03-28 NOTE — TOC Progression Note (Signed)
Transition of Care [facility]) - Progression Note    Patient Details  Name: Kevin Moore MRN: [id number] Date of Birth: July 07, 1950  [facility] CM/SW [place], RN Phone Number: 03/28/2022, 8:56 AM  Clinical Narrative:    The patient will go home after surgery, Open with [place] for [facility] services prior to Surgery by surgeons office [place] [employer] company to deliver a RW and 3 in 1 to the bedside   Expected Discharge Plan: [place]    Expected Discharge Plan and Services   Discharge Planning Services: CM Consult                     DME Arranged: Gilford Rile rolling, 3-N-1 DME Agency: [employer] Date DME Agency Contacted: 03/28/22 Time DME Agency Contacted: [phone] Representative spoke with at DME Agency: [place]: PT [place]: [place] Date [place]: 03/28/22 Time [place]: [phone] Representative spoke with at [place]: [place]   Social Determinants of Health ([place]) Interventions SDOH Screenings   Food Insecurity: No Food Insecurity (07/15/2020)  Housing: Low Risk  (07/15/2020)  Transportation Needs: No Transportation Needs (07/15/2020)  Alcohol Screen: Low Risk  (10/17/2018)  Depression (PHQ2-9): Low Risk  (07/15/2020)  Financial Resource Strain: Low Risk  (07/15/2020)  Physical Activity: Sufficiently Active (07/15/2020)  Social Connections: Unknown (07/15/2020)  Stress: No Stress Concern Present (07/15/2020)  Tobacco Use: Low Risk  (03/28/2022)    Readmission Risk Interventions     No data to display

## 2022-03-28 NOTE — Progress Notes (Addendum)
Patient received 30 mg toradol injected at the surgical site. Has an order from Dr. Roland Rack to give 15 mg toradol IV in PACU. Confirmed that this was still okay to give with Dr. Roland Rack. Per MD, it is okay to be administered.

## 2022-03-29 ENCOUNTER — Encounter: Payer: Self-pay | Admitting: Surgery

## 2022-03-29 ENCOUNTER — Encounter: Payer: Self-pay | Admitting: Urology

## 2022-03-29 DIAGNOSIS — Z86718 Personal history of other venous thrombosis and embolism: Secondary | ICD-10-CM | POA: Diagnosis not present

## 2022-03-29 DIAGNOSIS — Z87891 Personal history of nicotine dependence: Secondary | ICD-10-CM | POA: Diagnosis not present

## 2022-03-29 DIAGNOSIS — I10 Essential (primary) hypertension: Secondary | ICD-10-CM | POA: Diagnosis not present

## 2022-03-29 DIAGNOSIS — I252 Old myocardial infarction: Secondary | ICD-10-CM | POA: Diagnosis not present

## 2022-03-29 DIAGNOSIS — Z955 Presence of coronary angioplasty implant and graft: Secondary | ICD-10-CM | POA: Diagnosis not present

## 2022-03-29 DIAGNOSIS — Z9049 Acquired absence of other specified parts of digestive tract: Secondary | ICD-10-CM | POA: Diagnosis not present

## 2022-03-29 DIAGNOSIS — Z9181 History of falling: Secondary | ICD-10-CM | POA: Diagnosis not present

## 2022-03-29 DIAGNOSIS — I2510 Atherosclerotic heart disease of native coronary artery without angina pectoris: Secondary | ICD-10-CM | POA: Diagnosis not present

## 2022-03-29 DIAGNOSIS — E785 Hyperlipidemia, unspecified: Secondary | ICD-10-CM | POA: Diagnosis not present

## 2022-03-29 DIAGNOSIS — Z7901 Long term (current) use of anticoagulants: Secondary | ICD-10-CM | POA: Diagnosis not present

## 2022-03-29 DIAGNOSIS — Z85820 Personal history of malignant melanoma of skin: Secondary | ICD-10-CM | POA: Diagnosis not present

## 2022-03-29 DIAGNOSIS — Z96652 Presence of left artificial knee joint: Secondary | ICD-10-CM | POA: Diagnosis not present

## 2022-03-29 DIAGNOSIS — K219 Gastro-esophageal reflux disease without esophagitis: Secondary | ICD-10-CM | POA: Diagnosis not present

## 2022-03-29 DIAGNOSIS — Z471 Aftercare following joint replacement surgery: Secondary | ICD-10-CM | POA: Diagnosis not present

## 2022-03-29 NOTE — Telephone Encounter (Signed)
Spoke to patient and per [facility] catheter needs to stay in for 7 days. Patient's wife will remove the catheter on Tuesday 04/04/2022. I have scheduled him to come in at 1 for a PM PVR.

## 2022-03-30 DIAGNOSIS — Z96652 Presence of left artificial knee joint: Secondary | ICD-10-CM | POA: Insufficient documentation

## 2022-04-03 NOTE — Anesthesia Postprocedure Evaluation (Signed)
Anesthesia Post Note  Patient: Kevin Moore  Procedure(s) Performed: TOTAL KNEE ARTHROPLASTY (Left: Knee)  Patient location during evaluation: PACU Anesthesia Type: Spinal Level of consciousness: awake and alert Pain management: pain level controlled Vital Signs Assessment: post-procedure vital signs reviewed and stable Respiratory status: spontaneous breathing, nonlabored ventilation, respiratory function stable and patient connected to nasal cannula oxygen Cardiovascular status: blood pressure returned to baseline and stable Postop Assessment: no apparent nausea or vomiting Anesthetic complications: no   No notable events documented.   Last Vitals:  Vitals:   03/28/22 1521 03/28/22 1633  BP: (!) 150/95 121/72  Pulse: 66 64  Resp: 18 18  Temp:  (!) 36.4 C  SpO2: 96% 95%    Last Pain:  Vitals:   03/29/22 0904  TempSrc:   PainSc: 0-No pain                 Molli Barrows

## 2022-04-04 ENCOUNTER — Encounter: Payer: Self-pay | Admitting: Physician Assistant

## 2022-04-04 ENCOUNTER — Ambulatory Visit (INDEPENDENT_AMBULATORY_CARE_PROVIDER_SITE_OTHER): Payer: PPO | Admitting: Physician Assistant

## 2022-04-04 DIAGNOSIS — N9989 Other postprocedural complications and disorders of genitourinary system: Secondary | ICD-10-CM

## 2022-04-04 DIAGNOSIS — R338 Other retention of urine: Secondary | ICD-10-CM

## 2022-04-04 LAB — BLADDER SCAN AMB NON-IMAGING

## 2022-04-04 NOTE — Progress Notes (Signed)
04/04/2022 1:19 PM   Kevin Moore March 18, 1951 [id number]  CC: Chief Complaint  Patient presents with   Follow-up   Urinary Retention    PVR   HPI: Kevin Moore is a 72 y.o. male with PMH BPH with incomplete bladder emptying on silodosin, elevated PSA, and recurrent prostatitis who developed postoperative urinary retention after undergoing left total knee arthroplasty with Dr. Roland Rack 7 days ago requiring Foley catheter placement who presents today for voiding trial.  He is accompanied today by his wife, who contributes to HPI.  His wife removed his catheter at home this morning around 7:45 AM.  He reports he has urinated 3-4 times since without difficulty.  He denies dysuria and has no acute concerns.  He remains on silodosin.  PVR 122 mL.  PMH: Past Medical History:  Diagnosis Date   Acute ST elevation myocardial infarction (STEMI) of inferior wall ([place]) 10/17/2014   a.) LHC 10/17/2014: <50% LAD, 50-60% prox mid diag, 50% pLCx, 99% mid major marg, 80-90% prox 2/3s RCA - during cath, spontaneous occ of subtotal LCx caused VF (defib 200J x 2). PCI of LCx (2.5 x 12 mm Xience Alpine). Started on amio gtt (bradycardia). Started on dopamine (SOB - d/c'd). Post-cath, dev WCT/polymorphic VT. EP consulted and lidocaine gtt started; b.) PCI of RCA (DES x2) on 10/20/2014   Beta-blocker intolerance    a.) causes profound bradycardia   Coronary artery disease    a.) LHC 10/17/2014: <50% LAD, 50-60% prox portion of mid diagonal, 50% pLCx, 99% mid major marginal, 80-90% proximal 2/3s RCA  --> PCI performed placing a 2.5 x 12 mm Xience Alpine DES x 1 mLCx.; b.) PCI 10/20/2014: 80-90% o-mRCA --> 3.0 x 32 mm (proximal) and 2.5 x 38 mm (distal) Synergy DES placed.   GERD (gastroesophageal reflux disease)    History of colonic polyps    HLD (hyperlipidemia)    Hypertension    Long term current use of antithrombotics/antiplatelets    a.) clopidogrel   Melanoma ([place])    Osteoarthritis     Polymorphic ventricular tachycardia ([place]) 10/17/2014   a.) post cardiac catheterization --> developed WCT/polymorphic VT. EP consulted and lidocaine gtt started --> felt to be secondary to known RCA disease. PCI of RCA recommended; no AICD deemed necessary at that time.   Prostatitis    Ventricular fibrillation ([place]) 10/17/2014   a.) in setting of STEMI 10/17/2014 --> during cath, spontaneous occlusion of subtotal LCx caused VF requring defib 200J x 2. PCI of LCx (2.5 x 12 mm Xience Alpine DES). Started on amiodarone gtt, which caused bradycardia. Started on dopamine, which caused SOB (d/c'd).    Surgical History: Past Surgical History:  Procedure Laterality Date   COLON SURGERY     partial removal of colon with perforation due to suspected fish bone.   CORONARY ANGIOPLASTY WITH STENT PLACEMENT  10/17/2014   Procedure: CORONARY ANGIOPLASTY WITH STENT PLACEMENT (2.5 x 12 mm Xience Alpine to [employer]); Location: [facility]   CORONARY STENT INTERVENTION Left 10/20/2014   Procedure: CORONARY STENT INTERVENTION (3.0 x 32 mm Synergy DES pRCA, 2.5 x 38 mm Synergy DES dRCA); Location: [facility]   ESOPHAGEAL DILATION     KNEE ARTHROSCOPY Left    TONSILLECTOMY     TOTAL KNEE ARTHROPLASTY Left 03/28/2022   Procedure: TOTAL KNEE ARTHROPLASTY;  Surgeon: Corky Mull, MD;  Location: [facility];  Service: Orthopedics;  Laterality: Left;    Home Medications:  Allergies as of 04/04/2022  Reactions   Beta Adrenergic Blockers Other (See Comments)   Heart rate drops into the 20s if he takes beta blockers.        Medication List        Accurate as of April 04, 2022  1:19 PM. If you have any questions, ask your nurse or doctor.          apixaban 2.5 MG Tabs tablet Commonly known as: Eliquis Take 1 tablet (2.5 mg total) by mouth 2 (two) times daily.   Cholecalciferol 25 MCG (1000 UT) capsule Take 1,000 Units by mouth daily.   ezetimibe 10 MG tablet Commonly known as: ZETIA TAKE 1 TABLET BY MOUTH  DAILY   Magnesium 250 MG Tabs Take 1 tablet by mouth daily.   omeprazole 20 MG capsule Commonly known as: PRILOSEC Take 1 capsule (20 mg total) by mouth daily.   oxyCODONE 5 MG immediate release tablet Commonly known as: Roxicodone Take 1-2 tablets (5-10 mg total) by mouth every 4 (four) hours as needed for moderate pain or severe pain.   rosuvastatin 40 MG tablet Commonly known as: CRESTOR TAKE 1 TABLET BY MOUTH DAILY   silodosin 8 MG Caps capsule Commonly known as: RAPAFLO Take 1 capsule (8 mg total) by mouth daily with breakfast.   telmisartan-hydrochlorothiazide 40-12.5 MG tablet Commonly known as: MICARDIS HCT TAKE 1 TABLET BY MOUTH DAILY   vitamin C with rose hips 500 MG tablet Take 500 mg by mouth daily.   zinc gluconate 50 MG tablet Take 50 mg by mouth daily.        Allergies:  Allergies  Allergen Reactions   Beta Adrenergic Blockers Other (See Comments)    Heart rate drops into the 20s if he takes beta blockers.    Family History: Family History  Problem Relation Age of Onset   Hyperlipidemia Mother    Cancer Mother        Ovarian, died age 55   Heart disease Father        bypass at age 69   Protein C deficiency Brother    Epilepsy Son     Social History:   reports that he has never smoked. He has never been exposed to tobacco smoke. He has never used smokeless tobacco. He reports current alcohol use of about 3.0 standard drinks of alcohol per week. He reports that he does not use drugs.  Physical Exam: There were no vitals taken for this visit.  Constitutional:  Alert and oriented, no acute distress, nontoxic appearing HEENT: [place], AT Cardiovascular: No clubbing, cyanosis, or edema Respiratory: Normal respiratory effort, no increased work of breathing Skin: No rashes, bruises or suspicious lesions Neurologic: Grossly intact, no focal deficits, moving all 4 extremities Psychiatric: Normal mood and affect  Laboratory Data: Results for orders  placed or performed in visit on 04/04/22  BLADDER SCAN AMB NON-IMAGING  Result Value Ref Range   Scan Result 162m    Assessment & Plan:   1. Postoperative urinary retention Resolved.  He is emptying at his baseline and remains on silodosin.  Okay to keep annual follow-up with Dr. SBernardo Heaterlater this year, and return to clinic sooner if needed.  He expressed understanding.  I did encourage him to to notify subsequent surgical teams of his history of postoperative urinary retention in case of recurrence. - BLADDER SCAN AMB NON-IMAGING  Return if symptoms worsen or fail to improve.  SDebroah Loop PA-C  [facility] [address] [place]  [address] [phone]    

## 2022-04-05 ENCOUNTER — Telehealth: Payer: Self-pay | Admitting: *Deleted

## 2022-04-05 DIAGNOSIS — R338 Other retention of urine: Secondary | ICD-10-CM

## 2022-04-05 MED ORDER — SULFAMETHOXAZOLE-TRIMETHOPRIM 800-160 MG PO TABS: 1.0000 | ORAL_TABLET | Freq: Two times a day (BID) | ORAL | 0 refills | Status: DC

## 2022-04-05 NOTE — Telephone Encounter (Signed)
Patient's wife notified and they will start ABX.

## 2022-04-05 NOTE — Telephone Encounter (Signed)
Pt's wife calling stating that after is OV yesterday, pt started with chills and urinary frequency. Wife called after hours and they suggested collecting a urine and maybe starting Bactrim. Wife cleaned a cup, collected urine and put in the refrigerator, she had some left over bactrim and gave him one. Wife would like to drop the urine off and get rx for bactrim. I advised they would need to be seen, they declined due to pt having knee surgery and he refused coming in. Please advise

## 2022-04-05 NOTE — Telephone Encounter (Signed)
They may discard the urine sample, as it was not collected in a sterile container so results are unlikely to be accurate.  I have sent empiric Bactrim DS twice daily x 7 days to [place].  If symptoms do not improve, he needs to be seen in clinic again.

## 2022-04-07 ENCOUNTER — Telehealth: Payer: Self-pay | Admitting: Family

## 2022-04-07 ENCOUNTER — Telehealth: Payer: Self-pay

## 2022-04-07 ENCOUNTER — Ambulatory Visit
Admission: EM | Admit: 2022-04-07 | Discharge: 2022-04-07 | Disposition: A | Discharge status: Home / Self Care | Payer: PPO | Attending: Emergency Medicine | Admitting: Emergency Medicine

## 2022-04-07 ENCOUNTER — Emergency Department
Admission: EM | Admit: 2022-04-07 | Discharge: 2022-04-07 | Disposition: A | Discharge status: Home / Self Care | Payer: PPO | Attending: Emergency Medicine | Admitting: Emergency Medicine

## 2022-04-07 ENCOUNTER — Other Ambulatory Visit: Payer: Self-pay

## 2022-04-07 DIAGNOSIS — R350 Frequency of micturition: Secondary | ICD-10-CM | POA: Diagnosis not present

## 2022-04-07 DIAGNOSIS — R339 Retention of urine, unspecified: Secondary | ICD-10-CM | POA: Diagnosis present

## 2022-04-07 DIAGNOSIS — N390 Urinary tract infection, site not specified: Secondary | ICD-10-CM | POA: Insufficient documentation

## 2022-04-07 DIAGNOSIS — R1030 Lower abdominal pain, unspecified: Secondary | ICD-10-CM | POA: Diagnosis not present

## 2022-04-07 DIAGNOSIS — D72829 Elevated white blood cell count, unspecified: Secondary | ICD-10-CM | POA: Insufficient documentation

## 2022-04-07 DIAGNOSIS — E871 Hypo-osmolality and hyponatremia: Secondary | ICD-10-CM

## 2022-04-07 DIAGNOSIS — R140 Abdominal distension (gaseous): Secondary | ICD-10-CM | POA: Diagnosis not present

## 2022-04-07 DIAGNOSIS — R972 Elevated prostate specific antigen [PSA]: Secondary | ICD-10-CM | POA: Diagnosis not present

## 2022-04-07 LAB — COMPREHENSIVE METABOLIC PANEL
ALT: 104 U/L — ABNORMAL HIGH (ref 0–44)
AST: 58 U/L — ABNORMAL HIGH (ref 15–41)
Albumin: 3.8 g/dL (ref 3.5–5.0)
Alkaline Phosphatase: 125 U/L (ref 38–126)
Anion gap: 11 (ref 5–15)
BUN: 26 mg/dL — ABNORMAL HIGH (ref 8–23)
CO2: 22 mmol/L (ref 22–32)
Calcium: 8.8 mg/dL — ABNORMAL LOW (ref 8.9–10.3)
Chloride: 95 mmol/L — ABNORMAL LOW (ref 98–111)
Creatinine, Ser: 0.96 mg/dL (ref 0.61–1.24)
GFR, Estimated: >60 mL/min (ref >60)
Glucose, Bld: 115 mg/dL — ABNORMAL HIGH (ref 70–99)
Potassium: 4.8 mmol/L (ref 3.5–5.1)
Sodium: 128 mmol/L — ABNORMAL LOW (ref 135–145)
Total Bilirubin: 1.6 mg/dL — ABNORMAL HIGH (ref 0.3–1.2)
Total Protein: 7.1 g/dL (ref 6.5–8.1)

## 2022-04-07 LAB — URINALYSIS, ROUTINE W REFLEX MICROSCOPIC
Bacteria, UA: NONE SEEN
Bilirubin Urine: NEGATIVE
Glucose, UA: NEGATIVE mg/dL
Hgb urine dipstick: NEGATIVE
Ketones, ur: NEGATIVE mg/dL
Leukocytes,Ua: NEGATIVE
Nitrite: POSITIVE — AB
Protein, ur: 30 mg/dL — AB
Specific Gravity, Urine: 1.023 (ref 1.005–1.030)
WBC, UA: >50 WBC/hpf — ABNORMAL HIGH (ref 0–5)
pH: 5 (ref 5.0–8.0)

## 2022-04-07 LAB — CBC WITH DIFFERENTIAL/PLATELET
Abs Immature Granulocytes: 0.12 10*3/uL — ABNORMAL HIGH (ref 0.00–0.07)
Basophils Absolute: 0 10*3/uL (ref 0.0–0.1)
Basophils Relative: 0 %
Eosinophils Absolute: 0.1 10*3/uL (ref 0.0–0.5)
Eosinophils Relative: 0 %
HCT: 37.8 % — ABNORMAL LOW (ref 39.0–52.0)
Hemoglobin: 12.5 g/dL — ABNORMAL LOW (ref 13.0–17.0)
Immature Granulocytes: 1 %
Lymphocytes Relative: 9 %
Lymphs Abs: 1.3 10*3/uL (ref 0.7–4.0)
MCH: 29.8 pg (ref 26.0–34.0)
MCHC: 33.1 g/dL (ref 30.0–36.0)
MCV: 90 fL (ref 80.0–100.0)
Monocytes Absolute: 1.6 10*3/uL — ABNORMAL HIGH (ref 0.1–1.0)
Monocytes Relative: 12 %
Neutro Abs: 10.7 10*3/uL — ABNORMAL HIGH (ref 1.7–7.7)
Neutrophils Relative %: 78 %
Platelets: 341 10*3/uL (ref 150–400)
RBC: 4.2 MIL/uL — ABNORMAL LOW (ref 4.22–5.81)
RDW: 12.5 % (ref 11.5–15.5)
WBC: 13.9 10*3/uL — ABNORMAL HIGH (ref 4.0–10.5)
nRBC: 0 % (ref 0.0–0.2)

## 2022-04-07 LAB — POCT URINALYSIS DIP (MANUAL ENTRY)
Glucose, UA: 100 mg/dL — AB
Nitrite, UA: POSITIVE — AB
Protein Ur, POC: 30 mg/dL — AB
Spec Grav, UA: 1.015 (ref 1.010–1.025)
Urobilinogen, UA: 2 E.U./dL — AB
pH, UA: 5 (ref 5.0–8.0)

## 2022-04-07 MED ORDER — SODIUM CHLORIDE 0.9 % IV BOLUS
1000.0000 mL | Freq: Once | INTRAVENOUS | Status: AC
  Administered 2022-04-07: 1000 mL via INTRAVENOUS

## 2022-04-07 MED ORDER — CEPHALEXIN 500 MG PO CAPS: 500.0000 mg | ORAL_CAPSULE | Freq: Two times a day (BID) | ORAL | 0 refills | Status: AC

## 2022-04-07 NOTE — Discharge Instructions (Addendum)
Go to the emergency department for evaluation of your firm, tender, distended abdomen.    Discontinue the Bactrim.  Take the Keflex as directed.  The urine culture is pending.  We will call you if it shows the need to change or discontinue your antibiotic.

## 2022-04-07 NOTE — Discharge Instructions (Addendum)
Your exam and labs are consistent with acute urinary retention as well as evidence of UTI.  Take the previously prescribed antibiotic as discussed.  You also have hyponatremia secondary to increased water intake in the postop setting.  You should electrolyte water, and increase your sodium intake over the weekend.  Follow-up with urology for reevaluation.  Return to the ED for any worsening symptoms as discussed.

## 2022-04-07 NOTE — ED Notes (Signed)
Patient is being discharged from the Urgent Care and sent to the Emergency Department via POV . Per Barkley Boards, NP, patient is in need of higher level of care due to abdominal distention. Patient is aware and verbalizes understanding of plan of care.   Vitals:   04/07/22 1414  BP: 116/72  Pulse: 95  Resp: 18  Temp: 98.2 F (36.8 C)  SpO2: 95%

## 2022-04-07 NOTE — ED Triage Notes (Addendum)
Patient to Urgent Care with wife, complaints of urinary frequency and back pain.   Patient had a knee replacement 10 days ago w/ spinal. Patient had a foley cath placed which remained for 7 days (Removed Tuesday). Started having UTI symptoms on Saturday. Started having chills and worsening UTI symptoms Tuesday evening. Denies any known fevers.    Contacted urologist and prescribed abx. Has now take bactrim for 3 days w/ azo. Reports minimal improvement in symptoms.

## 2022-04-07 NOTE — ED Notes (Signed)
Pt bladder sanned in triage and results reads over 999 ml, MD aware, new orders for indwelling foley.

## 2022-04-07 NOTE — Telephone Encounter (Signed)
Mrs Web designer, called stating patient has been on Bactrim for 3 days along with AZO, patient still not improving, urinating every 30 minutes with small amount of urine coming out, has back pain now too. See phone note 04/05/22 in regards to this also. Spoke with Sam and advised Mrs Pavich patient needs to be checked and get UA and culture done, unfortunately we are not able to work patient in today and they were advised to go to Urgent Care. Mrs Kachmar verbalized understanding.

## 2022-04-07 NOTE — ED Triage Notes (Addendum)
Pt presents to ED with c/o of possible UTI and possible urinary retention. Pt states not able to fully empty bladder in 4 days.   Pt states this has happened before. Pt does have some distention to bladder area.

## 2022-04-07 NOTE — ED Provider Notes (Signed)
[facility]    CSN: [id number] Arrival date & time: 04/07/22  1400      History   Chief Complaint Chief Complaint  Patient presents with   Dysuria    HPI Kevin Moore is a 72 y.o. male.  Patient presents with urinary frequency x 3 days.  He had a urinary catheter for 7 days following knee surgery which was removed on 04/04/2022.  He was prescribed Bactrim by his urologist and has been taking it for 3 days.  He is also taking Azo.  His urinary frequency continues and he now has low back pain today.  His abdomen is distended and tender.  Last bowel movement on 04/04/2022.  He denies fever, vomiting, diarrhea, hematuria, or other symptoms.  Patient had knee replacement on 03/28/2022.  The history is provided by the patient, the spouse and medical records.    Past Medical History:  Diagnosis Date   Acute ST elevation myocardial infarction (STEMI) of inferior wall ([place]) 10/17/2014   a.) LHC 10/17/2014: <50% LAD, 50-60% prox mid diag, 50% pLCx, 99% mid major marg, 80-90% prox 2/3s RCA - during cath, spontaneous occ of subtotal LCx caused VF (defib 200J x 2). PCI of LCx (2.5 x 12 mm Xience Alpine). Started on amio gtt (bradycardia). Started on dopamine (SOB - d/c'd). Post-cath, dev WCT/polymorphic VT. EP consulted and lidocaine gtt started; b.) PCI of RCA (DES x2) on 10/20/2014   Beta-blocker intolerance    a.) causes profound bradycardia   Coronary artery disease    a.) LHC 10/17/2014: <50% LAD, 50-60% prox portion of mid diagonal, 50% pLCx, 99% mid major marginal, 80-90% proximal 2/3s RCA  --> PCI performed placing a 2.5 x 12 mm Xience Alpine DES x 1 mLCx.; b.) PCI 10/20/2014: 80-90% o-mRCA --> 3.0 x 32 mm (proximal) and 2.5 x 38 mm (distal) Synergy DES placed.   GERD (gastroesophageal reflux disease)    History of colonic polyps    HLD (hyperlipidemia)    Hypertension    Long term current use of antithrombotics/antiplatelets    a.) clopidogrel   Melanoma ([place])     Osteoarthritis    Polymorphic ventricular tachycardia ([place]) 10/17/2014   a.) post cardiac catheterization --> developed WCT/polymorphic VT. EP consulted and lidocaine gtt started --> felt to be secondary to known RCA disease. PCI of RCA recommended; no AICD deemed necessary at that time.   Prostatitis    Ventricular fibrillation ([place]) 10/17/2014   a.) in setting of STEMI 10/17/2014 --> during cath, spontaneous occlusion of subtotal LCx caused VF requring defib 200J x 2. PCI of LCx (2.5 x 12 mm Xience Alpine DES). Started on amiodarone gtt, which caused bradycardia. Started on dopamine, which caused SOB (d/c'd).    Patient Active Problem List   Diagnosis Date Noted   Chronic rupture of ACL of left knee 10/21/2021   Post-traumatic osteoarthritis of left knee 10/21/2021   Melanoma of skin ([place]) 06/21/2021   Routine physical examination 06/14/2020   History of coronary artery stent placement 03/06/2016   Beat, premature ventricular 01/26/2015   Ventricular tachycardia ([place]) 01/26/2015   Ventricular premature depolarization 01/26/2015   Heart attack ([place]) 10/16/2014   Myocardial infarction ([place]) 10/16/2014   Coronary artery disease 08/25/2014   Essential (primary) hypertension 08/25/2014   Acid reflux 08/25/2014   HLD (hyperlipidemia) 08/25/2014    Past Surgical History:  Procedure Laterality Date   COLON SURGERY     partial removal of colon with perforation due to suspected fish bone.  CORONARY ANGIOPLASTY WITH STENT PLACEMENT  10/17/2014   Procedure: CORONARY ANGIOPLASTY WITH STENT PLACEMENT (2.5 x 12 mm Xience Alpine to [employer]); Location: [facility]   CORONARY STENT INTERVENTION Left 10/20/2014   Procedure: CORONARY STENT INTERVENTION (3.0 x 32 mm Synergy DES pRCA, 2.5 x 38 mm Synergy DES dRCA); Location: [facility]   ESOPHAGEAL DILATION     KNEE ARTHROSCOPY Left    TONSILLECTOMY     TOTAL KNEE ARTHROPLASTY Left 03/28/2022   Procedure: TOTAL KNEE ARTHROPLASTY;  Surgeon: Corky Mull, MD;   Location: [facility];  Service: Orthopedics;  Laterality: Left;       Home Medications    Prior to Admission medications   Medication Sig Start Date End Date Taking? Authorizing Provider  cephALEXin (KEFLEX) 500 MG capsule Take 1 capsule (500 mg total) by mouth 2 (two) times daily for 5 days. 04/07/22 04/12/22 Yes Sharion Balloon, NP  apixaban (ELIQUIS) 2.5 MG TABS tablet Take 1 tablet (2.5 mg total) by mouth 2 (two) times daily. 03/29/22   Poggi, Marshall Cork, MD  Ascorbic Acid (VITAMIN C WITH ROSE HIPS) 500 MG tablet Take 500 mg by mouth daily.    [provider]  Cholecalciferol 25 MCG (1000 UT) capsule Take 1,000 Units by mouth daily.    [provider]  ezetimibe (ZETIA) 10 MG tablet TAKE 1 TABLET BY MOUTH DAILY 08/08/21   Minna Merritts, MD  Magnesium 250 MG TABS Take 1 tablet by mouth daily.    [provider]  omeprazole (PRILOSEC) 20 MG capsule Take 1 capsule (20 mg total) by mouth daily. 08/09/21   Minna Merritts, MD  oxyCODONE (ROXICODONE) 5 MG immediate release tablet Take 1-2 tablets (5-10 mg total) by mouth every 4 (four) hours as needed for moderate pain or severe pain. 03/28/22   Poggi, Marshall Cork, MD  rosuvastatin (CRESTOR) 40 MG tablet TAKE 1 TABLET BY MOUTH DAILY 08/08/21   Minna Merritts, MD  silodosin (RAPAFLO) 8 MG CAPS capsule Take 1 capsule (8 mg total) by mouth daily with breakfast. 02/02/22   Stoioff, Ronda Fairly, MD  sulfamethoxazole-trimethoprim (BACTRIM DS) 800-160 MG tablet Take 1 tablet by mouth 2 (two) times daily for 7 days. 04/05/22 04/12/22  Vaillancourt, Aldona Bar, PA-C  telmisartan-hydrochlorothiazide (MICARDIS HCT) 40-12.5 MG tablet TAKE 1 TABLET BY MOUTH DAILY 08/08/21   Minna Merritts, MD  zinc gluconate 50 MG tablet Take 50 mg by mouth daily.    [provider]    Family History Family History  Problem Relation Age of Onset   Hyperlipidemia Mother    Cancer Mother        Ovarian, died age 80   Heart disease Father        bypass  at age 39   Protein C deficiency Brother    Epilepsy Son     Social History Social History   Tobacco Use   Smoking status: Never    Passive exposure: Never   Smokeless tobacco: Never  Vaping Use   Vaping Use: Never used  Substance Use Topics   Alcohol use: Yes    Alcohol/week: 3.0 standard drinks of alcohol    Types: 3 Cans of beer per week    Comment: occassional beer   Drug use: No     Allergies   Beta adrenergic blockers   Review of Systems Review of Systems  Constitutional:  Negative for chills and fever.  Gastrointestinal:  Positive for abdominal pain and constipation. Negative for diarrhea, nausea and  vomiting.  Genitourinary:  Positive for frequency. Negative for dysuria, flank pain and hematuria.  Musculoskeletal:  Positive for back pain.  Skin:  Negative for color change and rash.  All other systems reviewed and are negative.    Physical Exam Triage Vital Signs ED Triage Vitals  Enc Vitals Group     BP 04/07/22 1414 116/72     Pulse Rate 04/07/22 1414 95     Resp 04/07/22 1414 18     Temp 04/07/22 1414 98.2 F (36.8 C)     Temp src --      SpO2 04/07/22 1414 95 %     Weight 04/07/22 1412 220 lb 10.9 oz (100.1 kg)     Height 04/07/22 1412 '6\' 2"'$  (1.88 m)     Head Circumference --      Peak Flow --      Pain Score 04/07/22 1411 0     Pain Loc --      Pain Edu? --      Excl. in [place]? --    No data found.  Updated Vital Signs BP 116/72   Pulse 95   Temp 98.2 F (36.8 C)   Resp 18   Ht '6\' 2"'$  (1.88 m)   Wt 220 lb 10.9 oz (100.1 kg)   SpO2 95%   BMI 28.33 kg/m   Visual Acuity Right Eye Distance:   Left Eye Distance:   Bilateral Distance:    Right Eye Near:   Left Eye Near:    Bilateral Near:     Physical Exam Vitals and nursing note reviewed.  Constitutional:      General: He is not in acute distress.    Appearance: He is well-developed. He is not ill-appearing.  HENT:     Mouth/Throat:     Mouth: Mucous membranes are moist.   Cardiovascular:     Rate and Rhythm: Normal rate and regular rhythm.     Heart sounds: Normal heart sounds.  Pulmonary:     Effort: Pulmonary effort is normal. No respiratory distress.     Breath sounds: Normal breath sounds.  Abdominal:     General: Bowel sounds are decreased. There is distension.     Palpations: Abdomen is soft.     Tenderness: There is abdominal tenderness in the right lower quadrant and left lower quadrant. There is no right CVA tenderness, left CVA tenderness, guarding or rebound.     Comments: Lower abdomen if firm, tender, distended.  No CVAT.   Musculoskeletal:     Cervical back: Neck supple.  Skin:    General: Skin is warm and dry.  Neurological:     Mental Status: He is alert.  Psychiatric:        Mood and Affect: Mood normal.        Behavior: Behavior normal.      UC Treatments / Results  Labs (all labs ordered are listed, but only abnormal results are displayed) Labs Reviewed  POCT URINALYSIS DIP (MANUAL ENTRY) - Abnormal; Notable for the following components:      Result Value   Color, UA orange (*)    Clarity, UA cloudy (*)    Glucose, UA =100 (*)    Bilirubin, UA small (*)    Ketones, POC UA trace (5) (*)    Blood, UA trace-intact (*)    Protein Ur, POC =30 (*)    Urobilinogen, UA 2.0 (*)    Nitrite, UA Positive (*)    Leukocytes, UA Large (  3+) (*)    All other components within normal limits  URINE CULTURE    EKG   Radiology No results found.  Procedures Procedures (including critical care time)  Medications Ordered in UC Medications - No data to display  Initial Impression / Assessment and Plan / UC Course  I have reviewed the triage vital signs and the nursing notes.  Pertinent labs & imaging results that were available during my care of the patient were reviewed by me and considered in my medical decision making (see chart for details).    Abdominal distention, lower abdominal pain, UTI, urinary frequency.  Afebrile  and vital signs are stable.  Lower abdomen is firm and distended, mildly tender.  Patient is currently on Bactrim x 3 days for urinary symptoms; this was prescribed by his urologist.  He had a urinary catheter for 7 days following the surgery.  The catheter was removed on 04/04/2022 by his wife.  His symptoms developed at that evening.  Last bowel movement 04/04/2022.  Based on his firm tender abdomen, sending him to the ED for evaluation.  Instructed him to stop the Bactrim and prescribed cephalexin.  Urine culture pending.  Patient and his wife agreed to plan of care.  His wife will drive him to the ED.  Final Clinical Impressions(s) / UC Diagnoses   Final diagnoses:  Abdominal distention  Lower abdominal pain  Urinary tract infection without hematuria, site unspecified  Urinary frequency     Discharge Instructions      Go to the emergency department for evaluation of your firm, tender, distended abdomen.    Discontinue the Bactrim.  Take the Keflex as directed.  The urine culture is pending.  We will call you if it shows the need to change or discontinue your antibiotic.        ED Prescriptions     Medication Sig Dispense Auth. Provider   cephALEXin (KEFLEX) 500 MG capsule Take 1 capsule (500 mg total) by mouth 2 (two) times daily for 5 days. 10 capsule Sharion Balloon, NP      PDMP not reviewed this encounter.   Sharion Balloon, NP 04/07/22 1452

## 2022-04-07 NOTE — ED Provider Notes (Signed)
[facility] Emergency Department Provider Note     Event Date/Time   First MD Initiated Contact with Patient 04/07/22 1517     (approximate)   History   Urinary Retention   HPI  Kevin Moore is a 72 y.o. male  with history of BPH with incomplete bladder emptying on silodosin, elevated PSA, and recurrent prostatitis, presents with c/o urinary frequency x 3 days. He had a urinary catheter for 7 days following knee replacement surgery on 03/28/2022, which was removed on 04/04/2022. He was prescribed Bactrim by his urologist and has been taking it for 3 days. He is also taking Azo. His urinary frequency continues and he now has low back pain with onset today. He reports his last bowel movement was on 04/04/2022. He denies FCS, NVD, hematuria, or other symptoms.      Physical Exam   Triage Vital Signs: ED Triage Vitals [04/07/22 1504]  Enc Vitals Group     BP (!) 154/87     Pulse Rate 87     Resp 17     Temp 97.7 F (36.5 C)     Temp Source Oral     SpO2 95 %     Weight      Height      Head Circumference      Peak Flow      Pain Score 6     Pain Loc      Pain Edu?      Excl. in [place]?     Most recent vital signs: Vitals:   04/07/22 1504  BP: (!) 154/87  Pulse: 87  Resp: 17  Temp: 97.7 F (36.5 C)  SpO2: 95%    General Awake, no distress.  CV:  Good peripheral perfusion.  RESP:  Normal effort.  ABD:  No distention.  GU:   deferred   ED Results / Procedures / Treatments   Labs (all labs ordered are listed, but only abnormal results are displayed) Labs Reviewed  URINALYSIS, ROUTINE W REFLEX MICROSCOPIC - Abnormal; Notable for the following components:      Result Value   Color, Urine AMBER (*)    APPearance CLEAR (*)    Protein, ur 30 (*)    Nitrite POSITIVE (*)    WBC, UA >50 (*)    All other components within normal limits  CBC WITH DIFFERENTIAL/PLATELET - Abnormal; Notable for the following components:   WBC 13.9 (*)     RBC 4.20 (*)    Hemoglobin 12.5 (*)    HCT 37.8 (*)    Neutro Abs 10.7 (*)    Monocytes Absolute 1.6 (*)    Abs Immature Granulocytes 0.12 (*)    All other components within normal limits  COMPREHENSIVE METABOLIC PANEL - Abnormal; Notable for the following components:   Sodium 128 (*)    Chloride 95 (*)    Glucose, Bld 115 (*)    BUN 26 (*)    Calcium 8.8 (*)    AST 58 (*)    ALT 104 (*)    Total Bilirubin 1.6 (*)    All other components within normal limits  URINE CULTURE     EKG   RADIOLOGY  No results found.   PROCEDURES:  Critical Care performed: No  Procedures   MEDICATIONS ORDERED IN ED: Medications  sodium chloride 0.9 % bolus 1,000 mL (1,000 mLs Intravenous New Bag/Given 04/07/22 1859)     IMPRESSION / MDM / ASSESSMENT AND PLAN /  ED COURSE  I reviewed the triage vital signs and the nursing notes.                              Differential diagnosis includes, but is not limited to, urinary retention, BPH, UTI, nephrolithiasisx  Patient's presentation is most consistent with acute complicated illness / injury requiring diagnostic workup.  Patient's diagnosis is consistent with urinary retention likely secondary to BPH.  Patient with symptoms in the postop setting, complicated by exceptional intake of water, HCTZ for blood pressure, and static irritation secondary to Foley catheter placement.  Patient with UA evidence of nitrite positive urine and bacteriuria; with a prescription for Keflex pending at the pharmacy from his urgent care provider.  Patient CBC with a leukocytosis at 13.9, likely again representative of his postop state and kidney irritation.  Patient with a mild transaminitis and a mild hyponatremia noted on CMP.  Patient without evidence of acute symptomatic hyponatremia as he denies any nausea, vomiting, dizziness/confusion, or muscle cramps.  Patient will be given an empiric dose of saline in the ED, and will otherwise be advised to increase  electrolyte and salt intake over the weekend.  Patient was considered for admission for his hyponatremia, but considering symptoms are absent, and his hyponatremia is mild and likely due to his postop state and increased water intake, likely will be able to correct in the outpatient setting.  Patient is to follow up with urology in 2 to 3 days as discussed, as needed or otherwise directed. Patient is given ED precautions to return to the ED for any worsening or new symptoms.     FINAL CLINICAL IMPRESSION(S) / ED DIAGNOSES   Final diagnoses:  Urinary retention  Lower urinary tract infectious disease  Hyponatremia     Rx / DC Orders   ED Discharge Orders     None        Note:  This document was prepared using Dragon voice recognition software and may include unintentional dictation errors.    Melvenia Needles, PA-C 04/07/22 Neta Mends, MD 04/07/22 1928

## 2022-04-07 NOTE — Telephone Encounter (Signed)
Pt wife would like to be called regarding the pt surgery on 1/2

## 2022-04-09 LAB — URINE CULTURE: Culture: 20000 — AB

## 2022-04-10 ENCOUNTER — Encounter: Payer: Self-pay | Admitting: Family

## 2022-04-10 ENCOUNTER — Other Ambulatory Visit: Payer: Self-pay | Admitting: *Deleted

## 2022-04-10 DIAGNOSIS — N9989 Other postprocedural complications and disorders of genitourinary system: Secondary | ICD-10-CM

## 2022-04-10 LAB — URINE CULTURE: Culture: 20000 — AB

## 2022-04-10 MED ORDER — SULFAMETHOXAZOLE-TRIMETHOPRIM 800-160 MG PO TABS: 1.0000 | ORAL_TABLET | Freq: Two times a day (BID) | ORAL | 0 refills | Status: AC

## 2022-04-10 NOTE — Telephone Encounter (Addendum)
Talked with Kevin Moore today  about Kevin Moore. He was asking if he was on the right antibiotic . Kevin Moore states for him to stop the keflex and start the bactrim ds for 7 days. I advised patient. He is scheduled for v & t on  04/17/2022

## 2022-04-11 DIAGNOSIS — Z9181 History of falling: Secondary | ICD-10-CM | POA: Diagnosis not present

## 2022-04-11 DIAGNOSIS — Z96652 Presence of left artificial knee joint: Secondary | ICD-10-CM | POA: Diagnosis not present

## 2022-04-11 DIAGNOSIS — Z86718 Personal history of other venous thrombosis and embolism: Secondary | ICD-10-CM | POA: Diagnosis not present

## 2022-04-11 DIAGNOSIS — K219 Gastro-esophageal reflux disease without esophagitis: Secondary | ICD-10-CM | POA: Diagnosis not present

## 2022-04-11 DIAGNOSIS — Z7901 Long term (current) use of anticoagulants: Secondary | ICD-10-CM | POA: Diagnosis not present

## 2022-04-11 DIAGNOSIS — Z87891 Personal history of nicotine dependence: Secondary | ICD-10-CM | POA: Diagnosis not present

## 2022-04-11 DIAGNOSIS — Z955 Presence of coronary angioplasty implant and graft: Secondary | ICD-10-CM | POA: Diagnosis not present

## 2022-04-11 DIAGNOSIS — Z85820 Personal history of malignant melanoma of skin: Secondary | ICD-10-CM | POA: Diagnosis not present

## 2022-04-11 DIAGNOSIS — I10 Essential (primary) hypertension: Secondary | ICD-10-CM | POA: Diagnosis not present

## 2022-04-11 DIAGNOSIS — Z9049 Acquired absence of other specified parts of digestive tract: Secondary | ICD-10-CM | POA: Diagnosis not present

## 2022-04-11 DIAGNOSIS — Z471 Aftercare following joint replacement surgery: Secondary | ICD-10-CM | POA: Diagnosis not present

## 2022-04-11 DIAGNOSIS — E785 Hyperlipidemia, unspecified: Secondary | ICD-10-CM | POA: Diagnosis not present

## 2022-04-11 DIAGNOSIS — I2510 Atherosclerotic heart disease of native coronary artery without angina pectoris: Secondary | ICD-10-CM | POA: Diagnosis not present

## 2022-04-11 DIAGNOSIS — I252 Old myocardial infarction: Secondary | ICD-10-CM | POA: Diagnosis not present

## 2022-04-11 NOTE — Telephone Encounter (Signed)
Spoke to pt wife and appt is scheduled for 04/18/22 @ 9:30 am

## 2022-04-12 DIAGNOSIS — Z96652 Presence of left artificial knee joint: Secondary | ICD-10-CM | POA: Diagnosis not present

## 2022-04-12 DIAGNOSIS — M25562 Pain in left knee: Secondary | ICD-10-CM | POA: Diagnosis not present

## 2022-04-12 NOTE — Telephone Encounter (Signed)
See previous message

## 2022-04-14 DIAGNOSIS — Z96652 Presence of left artificial knee joint: Secondary | ICD-10-CM | POA: Diagnosis not present

## 2022-04-17 ENCOUNTER — Ambulatory Visit: Payer: PPO | Admitting: Physician Assistant

## 2022-04-17 ENCOUNTER — Ambulatory Visit (INDEPENDENT_AMBULATORY_CARE_PROVIDER_SITE_OTHER): Payer: PPO | Admitting: Urology

## 2022-04-17 ENCOUNTER — Encounter: Payer: Self-pay | Admitting: Urology

## 2022-04-17 DIAGNOSIS — N401 Enlarged prostate with lower urinary tract symptoms: Secondary | ICD-10-CM

## 2022-04-17 DIAGNOSIS — R338 Other retention of urine: Secondary | ICD-10-CM

## 2022-04-17 DIAGNOSIS — R339 Retention of urine, unspecified: Secondary | ICD-10-CM | POA: Diagnosis not present

## 2022-04-17 LAB — BLADDER SCAN AMB NON-IMAGING: Scan Result: >640

## 2022-04-17 NOTE — Progress Notes (Signed)
 04/17/2022 8:52 PM   Kevin Moore 02/15/1951 [id number]  Referring provider: Arnett, Margaret G, FNP [address] [place],  [place] [address]  Chief Complaint  Patient presents with   Urinary Retention    HPI: 71 y.o. male with history of recurrent prostatitis and mild PSA elevation along with BPH and incomplete bladder emptying.  His residuals were ~ 300 mL however after starting silodosin last residual was 168 mL.  He underwent total knee replacement in early January and has had recurrent urinary retention.  He had a second voiding trial today.  He has voided but has sensation of incomplete emptying and residual 640 mL.  He elected to start intermittent catheterization   PMH: Past Medical History:  Diagnosis Date   Acute ST elevation myocardial infarction (STEMI) of inferior wall (HCC) 10/17/2014   a.) LHC 10/17/2014: <50% LAD, 50-60% prox mid diag, 50% pLCx, 99% mid major marg, 80-90% prox 2/3s RCA - during cath, spontaneous occ of subtotal LCx caused VF (defib 200J x 2). PCI of LCx (2.5 x 12 mm Xience Alpine). Started on amio gtt (bradycardia). Started on dopamine (SOB - d/c'd). Post-cath, dev WCT/polymorphic VT. EP consulted and lidocaine gtt started; b.) PCI of RCA (DES x2) on 10/20/2014   Beta-blocker intolerance    a.) causes profound bradycardia   Coronary artery disease    a.) LHC 10/17/2014: <50% LAD, 50-60% prox portion of mid diagonal, 50% pLCx, 99% mid major marginal, 80-90% proximal 2/3s RCA  --> PCI performed placing a 2.5 x 12 mm Xience Alpine DES x 1 mLCx.; b.) PCI 10/20/2014: 80-90% o-mRCA --> 3.0 x 32 mm (proximal) and 2.5 x 38 mm (distal) Synergy DES placed.   GERD (gastroesophageal reflux disease)    History of colonic polyps    HLD (hyperlipidemia)    Hypertension    Long term current use of antithrombotics/antiplatelets    a.) clopidogrel   Melanoma (HCC)    Osteoarthritis    Polymorphic ventricular tachycardia (HCC) 10/17/2014   a.)  post cardiac catheterization --> developed WCT/polymorphic VT. EP consulted and lidocaine gtt started --> felt to be secondary to known RCA disease. PCI of RCA recommended; no AICD deemed necessary at that time.   Prostatitis    Ventricular fibrillation (HCC) 10/17/2014   a.) in setting of STEMI 10/17/2014 --> during cath, spontaneous occlusion of subtotal LCx caused VF requring defib 200J x 2. PCI of LCx (2.5 x 12 mm Xience Alpine DES). Started on amiodarone gtt, which caused bradycardia. Started on dopamine, which caused SOB (d/c'd).    Surgical History: Past Surgical History:  Procedure Laterality Date   COLON SURGERY     partial removal of colon with perforation due to suspected fish bone.   CORONARY ANGIOPLASTY WITH STENT PLACEMENT  10/17/2014   Procedure: CORONARY ANGIOPLASTY WITH STENT PLACEMENT (2.5 x 12 mm Xience Alpine to mLCx); Location: [facility]   CORONARY STENT INTERVENTION Left 10/20/2014   Procedure: CORONARY STENT INTERVENTION (3.0 x 32 mm Synergy DES pRCA, 2.5 x 38 mm Synergy DES dRCA); Location: [facility]   ESOPHAGEAL DILATION     KNEE ARTHROSCOPY Left    TONSILLECTOMY     TOTAL KNEE ARTHROPLASTY Left 03/28/2022   Procedure: TOTAL KNEE ARTHROPLASTY;  Surgeon: Poggi, John J, MD;  Location: [facility] ORS;  Service: Orthopedics;  Laterality: Left;    Home Medications:  Allergies as of 04/17/2022       Reactions   Beta Adrenergic Blockers Other (See Comments)   Heart rate   drops into the 20s if he takes beta blockers.        Medication List        Accurate as of April 17, 2022  8:52 PM. If you have any questions, ask your nurse or doctor.          apixaban 2.5 MG Tabs tablet Commonly known as: Eliquis Take 1 tablet (2.5 mg total) by mouth 2 (two) times daily.   Cholecalciferol 25 MCG (1000 UT) capsule Take 1,000 Units by mouth daily.   ezetimibe 10 MG tablet Commonly known as: ZETIA TAKE 1 TABLET BY MOUTH DAILY   Magnesium 250 MG Tabs Take 1 tablet by mouth daily.    omeprazole 20 MG capsule Commonly known as: PRILOSEC Take 1 capsule (20 mg total) by mouth daily.   oxyCODONE 5 MG immediate release tablet Commonly known as: Roxicodone Take 1-2 tablets (5-10 mg total) by mouth every 4 (four) hours as needed for moderate pain or severe pain.   rosuvastatin 40 MG tablet Commonly known as: CRESTOR TAKE 1 TABLET BY MOUTH DAILY   silodosin 8 MG Caps capsule Commonly known as: RAPAFLO Take 1 capsule (8 mg total) by mouth daily with breakfast.   sulfamethoxazole-trimethoprim 800-160 MG tablet Commonly known as: BACTRIM DS Take 1 tablet by mouth 2 (two) times daily for 7 days.   telmisartan-hydrochlorothiazide 40-12.5 MG tablet Commonly known as: MICARDIS HCT TAKE 1 TABLET BY MOUTH DAILY   vitamin C with rose hips 500 MG tablet Take 500 mg by mouth daily.   zinc gluconate 50 MG tablet Take 50 mg by mouth daily.        Allergies:  Allergies  Allergen Reactions   Beta Adrenergic Blockers Other (See Comments)    Heart rate drops into the 20s if he takes beta blockers.    Family History: Family History  Problem Relation Age of Onset   Hyperlipidemia Mother    Cancer Mother        Ovarian, died age 83   Heart disease Father        bypass at age 79   Protein C deficiency Brother    Epilepsy Son     Social History:  reports that he has never smoked. He has never been exposed to tobacco smoke. He has never used smokeless tobacco. He reports current alcohol use of about 3.0 standard drinks of alcohol per week. He reports that he does not use drugs.   Physical Exam: There were no vitals taken for this visit.  Constitutional:  Alert and oriented, No acute distress. Psychiatric: Normal mood and affect.   Assessment & Plan:    1.  Urinary retention He will start intermittent catheterization and if he does not start voiding spontaneously we discussed outlet procedures.  DRE estimated 60 g.  He has not had a recent CT or TRUS to  calculate his prostate volume.  If interested in pursuing outlet surgery will need transrectal ultrasound for volume.  He will call back if interested in pursuing surgery\  Haya Hemler C Othar Curto, MD  [facility] [address], [address] [place], [place] [address] [phone]   

## 2022-04-17 NOTE — Progress Notes (Signed)
Continuous Intermittent Catheterization  Due to urinary retention patient is present today for a teaching of self I & O Catheterization. Patient was given detailed verbal and printed instructions of self catheterization. Patient was cleaned and prepped in a sterile fashion.  With instruction and assistance patient inserted a 14FR and urine return was noted 640 ml, urine was dark yellow in color. Patient tolerated well, no complications were noted Patient was given a sample bag with supplies to take home.  Instructions were given per [place] for patient to cath  intermittently as needed daily.  Patient will discuss surgery times with Melissa.  Performed by: Elberta Leatherwood, [place]

## 2022-04-17 NOTE — H&P (View-Only) (Signed)
04/17/2022 8:52 PM   Serita Grit Wolters [birth year] [id number]  Referring provider: Burnard Hawthorne, FNP [address] [place],  [place] [address]  Chief Complaint  Patient presents with   Urinary Retention    HPI: 72 y.o. male with history of recurrent prostatitis and mild PSA elevation along with BPH and incomplete bladder emptying.  His residuals were ~ 300 mL however after starting silodosin last residual was 168 mL.  He underwent total knee replacement in early January and has had recurrent urinary retention.  He had a second voiding trial today.  He has voided but has sensation of incomplete emptying and residual 640 mL.  He elected to start intermittent catheterization   PMH: Past Medical History:  Diagnosis Date   Acute ST elevation myocardial infarction (STEMI) of inferior wall ([place]) 10/17/2014   a.) LHC 10/17/2014: <50% LAD, 50-60% prox mid diag, 50% pLCx, 99% mid major marg, 80-90% prox 2/3s RCA - during cath, spontaneous occ of subtotal LCx caused VF (defib 200J x 2). PCI of LCx (2.5 x 12 mm Xience Alpine). Started on amio gtt (bradycardia). Started on dopamine (SOB - d/c'd). Post-cath, dev WCT/polymorphic VT. EP consulted and lidocaine gtt started; b.) PCI of RCA (DES x2) on 10/20/2014   Beta-blocker intolerance    a.) causes profound bradycardia   Coronary artery disease    a.) LHC 10/17/2014: <50% LAD, 50-60% prox portion of mid diagonal, 50% pLCx, 99% mid major marginal, 80-90% proximal 2/3s RCA  --> PCI performed placing a 2.5 x 12 mm Xience Alpine DES x 1 mLCx.; b.) PCI 10/20/2014: 80-90% o-mRCA --> 3.0 x 32 mm (proximal) and 2.5 x 38 mm (distal) Synergy DES placed.   GERD (gastroesophageal reflux disease)    History of colonic polyps    HLD (hyperlipidemia)    Hypertension    Long term current use of antithrombotics/antiplatelets    a.) clopidogrel   Melanoma ([place])    Osteoarthritis    Polymorphic ventricular tachycardia ([place]) 10/17/2014   a.)  post cardiac catheterization --> developed WCT/polymorphic VT. EP consulted and lidocaine gtt started --> felt to be secondary to known RCA disease. PCI of RCA recommended; no AICD deemed necessary at that time.   Prostatitis    Ventricular fibrillation ([name]) 10/17/2014   a.) in setting of STEMI 10/17/2014 --> during cath, spontaneous occlusion of subtotal LCx caused VF requring defib 200J x 2. PCI of LCx (2.5 x 12 mm Xience Alpine DES). Started on amiodarone gtt, which caused bradycardia. Started on dopamine, which caused SOB (d/c'd).    Surgical History: Past Surgical History:  Procedure Laterality Date   COLON SURGERY     partial removal of colon with perforation due to suspected fish bone.   CORONARY ANGIOPLASTY WITH STENT PLACEMENT  10/17/2014   Procedure: CORONARY ANGIOPLASTY WITH STENT PLACEMENT (2.5 x 12 mm Xience Alpine to [employer]); Location: [facility]   CORONARY STENT INTERVENTION Left 10/20/2014   Procedure: CORONARY STENT INTERVENTION (3.0 x 32 mm Synergy DES pRCA, 2.5 x 38 mm Synergy DES dRCA); Location: [facility]   ESOPHAGEAL DILATION     KNEE ARTHROSCOPY Left    TONSILLECTOMY     TOTAL KNEE ARTHROPLASTY Left 03/28/2022   Procedure: TOTAL KNEE ARTHROPLASTY;  Surgeon: Corky Mull, MD;  Location: [facility];  Service: Orthopedics;  Laterality: Left;    Home Medications:  Allergies as of 04/17/2022       Reactions   Beta Adrenergic Blockers Other (See Comments)   Heart rate  drops into the 20s if he takes beta blockers.        Medication List        Accurate as of April 17, 2022  8:52 PM. If you have any questions, ask your nurse or doctor.          apixaban 2.5 MG Tabs tablet Commonly known as: Eliquis Take 1 tablet (2.5 mg total) by mouth 2 (two) times daily.   Cholecalciferol 25 MCG (1000 UT) capsule Take 1,000 Units by mouth daily.   ezetimibe 10 MG tablet Commonly known as: ZETIA TAKE 1 TABLET BY MOUTH DAILY   Magnesium 250 MG Tabs Take 1 tablet by mouth daily.    omeprazole 20 MG capsule Commonly known as: PRILOSEC Take 1 capsule (20 mg total) by mouth daily.   oxyCODONE 5 MG immediate release tablet Commonly known as: Roxicodone Take 1-2 tablets (5-10 mg total) by mouth every 4 (four) hours as needed for moderate pain or severe pain.   rosuvastatin 40 MG tablet Commonly known as: CRESTOR TAKE 1 TABLET BY MOUTH DAILY   silodosin 8 MG Caps capsule Commonly known as: RAPAFLO Take 1 capsule (8 mg total) by mouth daily with breakfast.   sulfamethoxazole-trimethoprim 800-160 MG tablet Commonly known as: BACTRIM DS Take 1 tablet by mouth 2 (two) times daily for 7 days.   telmisartan-hydrochlorothiazide 40-12.5 MG tablet Commonly known as: MICARDIS HCT TAKE 1 TABLET BY MOUTH DAILY   vitamin C with rose hips 500 MG tablet Take 500 mg by mouth daily.   zinc gluconate 50 MG tablet Take 50 mg by mouth daily.        Allergies:  Allergies  Allergen Reactions   Beta Adrenergic Blockers Other (See Comments)    Heart rate drops into the 20s if he takes beta blockers.    Family History: Family History  Problem Relation Age of Onset   Hyperlipidemia Mother    Cancer Mother        Ovarian, died age 39   Heart disease Father        bypass at age 74   Protein C deficiency Brother    Epilepsy Son     Social History:  reports that he has never smoked. He has never been exposed to tobacco smoke. He has never used smokeless tobacco. He reports current alcohol use of about 3.0 standard drinks of alcohol per week. He reports that he does not use drugs.   Physical Exam: There were no vitals taken for this visit.  Constitutional:  Alert and oriented, No acute distress. Psychiatric: Normal mood and affect.   Assessment & Plan:    1.  Urinary retention He will start intermittent catheterization and if he does not start voiding spontaneously we discussed outlet procedures.  DRE estimated 60 g.  He has not had a recent CT or TRUS to  calculate his prostate volume.  If interested in pursuing outlet surgery will need transrectal ultrasound for volume.  He will call back if interested in pursuing surgery\  Abbie Sons, MD  [facility] [address], [place], [place] [address] [phone]

## 2022-04-18 ENCOUNTER — Encounter: Payer: Self-pay | Admitting: Family

## 2022-04-18 ENCOUNTER — Telehealth: Payer: Self-pay | Admitting: *Deleted

## 2022-04-18 ENCOUNTER — Ambulatory Visit (INDEPENDENT_AMBULATORY_CARE_PROVIDER_SITE_OTHER): Payer: PPO | Admitting: Family

## 2022-04-18 VITALS — BP 130/76 | HR 76 | Temp 97.8°F | Ht 74.0 in | Wt 215.4 lb

## 2022-04-18 DIAGNOSIS — R3914 Feeling of incomplete bladder emptying: Secondary | ICD-10-CM

## 2022-04-18 DIAGNOSIS — R899 Unspecified abnormal finding in specimens from other organs, systems and tissues: Secondary | ICD-10-CM | POA: Diagnosis not present

## 2022-04-18 DIAGNOSIS — Z96652 Presence of left artificial knee joint: Secondary | ICD-10-CM | POA: Diagnosis not present

## 2022-04-18 DIAGNOSIS — Z23 Encounter for immunization: Secondary | ICD-10-CM

## 2022-04-18 DIAGNOSIS — N401 Enlarged prostate with lower urinary tract symptoms: Secondary | ICD-10-CM

## 2022-04-18 DIAGNOSIS — I10 Essential (primary) hypertension: Secondary | ICD-10-CM | POA: Diagnosis not present

## 2022-04-18 DIAGNOSIS — M25562 Pain in left knee: Secondary | ICD-10-CM | POA: Diagnosis not present

## 2022-04-18 LAB — COMPREHENSIVE METABOLIC PANEL
ALT: 86 U/L — ABNORMAL HIGH (ref 0–53)
AST: 31 U/L (ref 0–37)
Albumin: 4.1 g/dL (ref 3.5–5.2)
Alkaline Phosphatase: 149 U/L — ABNORMAL HIGH (ref 39–117)
BUN: 19 mg/dL (ref 6–23)
CO2: 26 mEq/L (ref 19–32)
Calcium: 9.5 mg/dL (ref 8.4–10.5)
Chloride: 97 mEq/L (ref 96–112)
Creatinine, Ser: 0.94 mg/dL (ref 0.40–1.50)
GFR: 82.55 mL/min (ref >60.00)
Glucose, Bld: 82 mg/dL (ref 70–99)
Potassium: 4.5 mEq/L (ref 3.5–5.1)
Sodium: 131 mEq/L — ABNORMAL LOW (ref 135–145)
Total Bilirubin: 0.7 mg/dL (ref 0.2–1.2)
Total Protein: 7.5 g/dL (ref 6.0–8.3)

## 2022-04-18 LAB — GAMMA GT: GGT: 91 U/L — ABNORMAL HIGH (ref 7–51)

## 2022-04-18 NOTE — Progress Notes (Unsigned)
Assessment & Plan:  There are no diagnoses linked to this encounter.   Return precautions given.   Risks, benefits, and alternatives of the medications and treatment plan prescribed today were discussed, and patient expressed understanding.   Education regarding symptom management and diagnosis given to patient on AVS either electronically or printed.  No follow-ups on file.  Mable Paris, FNP  Subjective:    Patient ID: Kevin Moore, male    DOB: [birth year], 72 y.o.   MRN: [id number]  CC: Kevin Moore is a 72 y.o. male who presents today for follow up.   HPI: Accompanied by wife Urinary freqeuncy, chills has resolved     S/p left TKA Dr Roland Rack 03/28/22. Complicated by urinary catheter for 7 days after, removed 04/04/22.   Had been following with Dr Bernardo Heater for incomplete bladder emptying prior to surgery. After surgery seen by urology for postoperative urinary retention which had resolved at that visit.  He is compliant with sildodosin Presents emergency room 04/07/2022 for urinary frequency. He had been on azo, bactrim ( from urology) at that time. Changed to cephalexin. Urine culture later reflected resistance to first generation cephalosporin. Bactrim sensitive < 20 Follow-up Dr. Bernardo Heater yesterday BPH with urinary retention.  He will start intermittent catheterization if he does not start voiding spontaneously.  Discussed outlet procedures and surgery.   Last seen by cardiology 08/02/2021 , and, Dr. Rockey Situ follow-up CAD s/p stent,.  He remains compliant with Plavix.   HLD-compliant with Crestor, Zetia. Hypertension-compliant with telmisartan, HCTZ Allergies: Beta adrenergic blockers Current Outpatient Medications on File Prior to Visit  Medication Sig Dispense Refill   Ascorbic Acid (VITAMIN C WITH ROSE HIPS) 500 MG tablet Take 500 mg by mouth daily.     Cholecalciferol 25 MCG (1000 UT) capsule Take 1,000 Units by mouth daily.     ezetimibe (ZETIA) 10 MG  tablet TAKE 1 TABLET BY MOUTH DAILY 30 tablet 11   Magnesium 250 MG TABS Take 1 tablet by mouth daily.     omeprazole (PRILOSEC) 20 MG capsule Take 1 capsule (20 mg total) by mouth daily. 30 capsule 11   oxyCODONE (ROXICODONE) 5 MG immediate release tablet Take 1-2 tablets (5-10 mg total) by mouth every 4 (four) hours as needed for moderate pain or severe pain. 40 tablet 0   rosuvastatin (CRESTOR) 40 MG tablet TAKE 1 TABLET BY MOUTH DAILY 90 tablet 3   silodosin (RAPAFLO) 8 MG CAPS capsule Take 1 capsule (8 mg total) by mouth daily with breakfast. 30 capsule 3   telmisartan-hydrochlorothiazide (MICARDIS HCT) 40-12.5 MG tablet TAKE 1 TABLET BY MOUTH DAILY 90 tablet 3   zinc gluconate 50 MG tablet Take 50 mg by mouth daily.     apixaban (ELIQUIS) 2.5 MG TABS tablet Take 1 tablet (2.5 mg total) by mouth 2 (two) times daily. (Patient not taking: Reported on 04/18/2022) 30 tablet 0   No current facility-administered medications on file prior to visit.    Review of Systems    Objective:    BP 130/76   Pulse 76   Temp 97.8 F (36.6 C) (Oral)   Ht '6\' 2"'$  (1.88 m)   Wt 215 lb 6.4 oz (97.7 kg)   SpO2 95%   BMI 27.66 kg/m  BP Readings from Last 3 Encounters:  04/18/22 130/76  04/07/22 133/70  04/07/22 116/72   Wt Readings from Last 3 Encounters:  04/18/22 215 lb 6.4 oz (97.7 kg)  04/07/22 220 lb 10.9 oz (100.1 kg)  03/28/22 220 lb 10.9 oz (100.1 kg)    Physical Exam

## 2022-04-18 NOTE — Telephone Encounter (Signed)
Pt and wife calling stating that pt is now CIC and last night they saw blood, they removed 51m at bedtime and 10065mthis morning and saw blood again. Should pt have cath put back in? Please advise

## 2022-04-18 NOTE — Telephone Encounter (Signed)
Blood is not uncommon with intermittent catheterization.  The decision to continue CIC versus an indwelling Foley would be based on their preference

## 2022-04-19 ENCOUNTER — Encounter: Payer: Self-pay | Admitting: Urology

## 2022-04-19 ENCOUNTER — Ambulatory Visit (INDEPENDENT_AMBULATORY_CARE_PROVIDER_SITE_OTHER): Payer: PPO | Admitting: Urology

## 2022-04-19 ENCOUNTER — Other Ambulatory Visit: Payer: Self-pay | Admitting: Family

## 2022-04-19 VITALS — BP 147/88 | HR 93 | Ht 72.0 in | Wt 215.0 lb

## 2022-04-19 DIAGNOSIS — R338 Other retention of urine: Secondary | ICD-10-CM

## 2022-04-19 DIAGNOSIS — N9989 Other postprocedural complications and disorders of genitourinary system: Secondary | ICD-10-CM

## 2022-04-19 DIAGNOSIS — N400 Enlarged prostate without lower urinary tract symptoms: Secondary | ICD-10-CM | POA: Insufficient documentation

## 2022-04-19 DIAGNOSIS — R899 Unspecified abnormal finding in specimens from other organs, systems and tissues: Secondary | ICD-10-CM

## 2022-04-19 LAB — BILIRUBIN, FRACTIONATED(TOT/DIR/INDIR)
Bilirubin, Direct: 0.2 mg/dL (ref 0.0–0.2)
Indirect Bilirubin: 0.5 mg/dL (calc) (ref 0.2–1.2)
Total Bilirubin: 0.7 mg/dL (ref 0.2–1.2)

## 2022-04-19 NOTE — Assessment & Plan Note (Signed)
Chronic, stable.  Continue micardis 40-12.'5mg'$ . Close follow up.

## 2022-04-19 NOTE — Assessment & Plan Note (Signed)
ED course reviewed with patient. Consider his options for treatment. He is considering HoLEP and will reach out to further discuss with Dr Diamantina Providence.

## 2022-04-19 NOTE — Telephone Encounter (Signed)
Patient notified and is wanting to do the TRU for prostate measurement so he can get set up for surgery. Appointment made.

## 2022-04-21 ENCOUNTER — Other Ambulatory Visit: Payer: Self-pay | Admitting: Urology

## 2022-04-21 DIAGNOSIS — N401 Enlarged prostate with lower urinary tract symptoms: Secondary | ICD-10-CM

## 2022-04-21 DIAGNOSIS — Z96652 Presence of left artificial knee joint: Secondary | ICD-10-CM | POA: Diagnosis not present

## 2022-04-21 DIAGNOSIS — M25562 Pain in left knee: Secondary | ICD-10-CM | POA: Diagnosis not present

## 2022-04-21 NOTE — Progress Notes (Unsigned)
Surgical Physician Order Form [facility] [place]  * Scheduling expectation : ASAP  *Length of Case: 30 minutes  *Clearance needed: yes, hold Plavix.  Had recent knee replacement  *Anticoagulation Instructions: Hold all anticoagulants  *Aspirin Instructions: N/A  *Post-op visit Date/Instructions:  1-3 day voiding trial  *Diagnosis: BPH w/urinary obstruction  *Procedure:   UroLift   Additional orders: N/A  -Admit type: OUTpatient  -Anesthesia: MAC  -VTE Prophylaxis Standing Order SCD's       Other:   -Standing Lab Orders Per Anesthesia    Lab other: UA&Urine Culture  -Standing Test orders EKG/Chest x-ray per Anesthesia       Test other:   - Medications:   TBD  -Other orders: Let Cristie Hem or Thurmond Butts know

## 2022-04-21 NOTE — Progress Notes (Signed)
   04/21/22  Chief Complaint  Patient presents with   Other     HPI: 72 y.o. with urinary retention status post total knee replacement.  He has failed voiding trials x 2 currently on CIC   Please see previous notes for details.     Blood pressure (!) 147/88, pulse 93, height 6' (1.829 m), weight 215 lb (97.5 kg). NED. A&Ox3.   No respiratory distress   Abd soft, NT, ND   Prostate transrectal ultrasound sizing   Informed consent was obtained after discussing risks/benefits of the procedure.  A time out was performed to ensure correct patient identity.   Pre-Procedure: -Transrectal probe was placed without difficulty -Transrectal Ultrasound performed revealing a 58 gm prostate measuring 3.6 x 5.7 x 5.4 cm (length) -No significant hypoechoic or median lobe noted      Assessment/ Plan: Moderate prostate enlargement on ultrasound He would be a candidate for HoLEP as we have previously discussed however would also be a UroLift candidate.  UroLift was discussed in detail and he was provided literature. He will continue self catheter now and will think over these options.   Abbie Sons, MD

## 2022-04-24 ENCOUNTER — Telehealth: Payer: Self-pay

## 2022-04-24 NOTE — Telephone Encounter (Signed)
Tried calling patient to set up surgery, no answer. Did leave detailed voicemail to call back. Will try again.

## 2022-04-25 ENCOUNTER — Telehealth: Payer: Self-pay | Admitting: *Deleted

## 2022-04-25 DIAGNOSIS — Z96652 Presence of left artificial knee joint: Secondary | ICD-10-CM | POA: Diagnosis not present

## 2022-04-25 NOTE — Progress Notes (Signed)
   [facility]  Surgery Date: Date: 05/09/2022  Surgeon: Dr. John Giovanni, MD  Inpt ( No  )   Outpt (Yes)   Obs ( No  )   Diagnosis: N40.1, N13.8 Benign Prostatic Hyperplasia with Urinary Obstruction  -CPT: 52441, [phone]  Surgery: Cystoscopy with Insertion of Urolift   Stop Anticoagulations: Yes would like for patient to hold Plavix  Cardiac/Medical/Pulmonary Clearance needed: Yes  Clearance needed from Dr: Rockey Situ  Clearance request sent on: Date: 04/25/22  *Orders entered into EPIC  Date: 04/25/22   *Case booked in EPIC  Date: 04/24/2022  *Notified pt of Surgery: Date: 04/24/2022  PRE-OP UA & CX: yes, will obtain in clinic on 04/27/2022  *Placed into Prior Authorization Work Fabio Bering Date: 04/25/22  Assistant/laser/rep:No

## 2022-04-25 NOTE — Progress Notes (Signed)
   PHONE NUMBER: [phone] for Surgical Coordinator FAX NUMBER: [phone]   REQUEST FOR SURGICAL CLEARANCE       Date: Date: 04/25/2022  Faxed to: Dr. Rockey Situ, MD  Surgeon: Dr. John Giovanni, MD     Date of Surgery: 05/09/2022  Operation: Cystoscopy with Insertion of Urolift    Anesthesia Type: MAC   Diagnosis: Benign Prostatic Hyperplasia with Urinary Obstruction  Patient Requires:   Cardiac / Vascular Clearance : Yes  Reason: Would like for patient to hold Plavix prior to procedure  Risk Assessment:    Low   _0       Moderate   _1     High   _2           This patient is optimized for surgery  YES _3       NO   _4    I recommend further assessment/workup prior to surgery. YES _5      NO  _6   Appointment scheduled for: _______________________   Further recommendations: ____________________________________     Physician Signature:__________________________________   Printed Name: ________________________________________   Date: _________________

## 2022-04-25 NOTE — Telephone Encounter (Signed)
I spoke with Mr. Gotay. We have discussed possible surgery dates and Tuesday February 13th, 2024 was agreed upon by all parties. Patient given information about surgery date, what to expect pre-operatively and post operatively.  We discussed that a Pre-Admission Testing office will be calling to set up the pre-op visit that will take place prior to surgery, and that these appointments are typically done over the phone with a Pre-Admissions RN. Informed patient that our office will communicate any additional care to be provided after surgery. Patients questions or concerns were discussed during our call. Advised to call our office should there be any additional information, questions or concerns that arise. Patient verbalized understanding.

## 2022-04-25 NOTE — Telephone Encounter (Signed)
   Pre-operative Risk Assessment    Patient Name: Kevin Moore  DOB: Jul 16, 1950 MRN: [id number]      Request for Surgical Clearance    Procedure:   CYSTOSCOPY WITH INSERTION OF UROLIFT  Date of Surgery:  Clearance 05/09/22                                 Surgeon:  DR. Nicki Reaper STOIOFF Surgeon's Group or Practice Name:  [facility] Phone number:  [phone] Fax number:  [phone]   Type of Clearance Requested:   - Medical  - Pharmacy:  Hold Clopidogrel (Plavix)     Type of Anesthesia:  MAC   Additional requests/questions:    Jiles Prows   04/25/2022, 4:57 PM

## 2022-04-26 NOTE — Telephone Encounter (Signed)
   Primary Cardiologist: Ida Rogue, MD  Chart reviewed as part of pre-operative protocol coverage. Given past medical history and time since last visit, based on ACC/AHA guidelines, Gilbert Manolis Willers would be at acceptable risk for the planned procedure without further cardiovascular testing.   I contacted the patient by phone since he was recently cleared for knee surgery. He reports he has been doing well with no concerning cardiac symptoms. He resumed Plavix after taking a course of Eliquis as prescribed by orthopedic surgeon. He was advised that if he develops new symptoms prior to surgery to contact our office to arrange a follow-up appointment. He verbalized understanding.  Per office protocol, he may hold Plavix for 5 days prior to procedure and should resume as soon as hemodynamically stable postoperatively.   I will route this recommendation to the requesting party via Epic fax function and remove from pre-op pool.  Please call with questions.  Emmaline Life, NP-C  04/26/2022, 9:03 AM [address], [address] Office [phone] Fax [phone]

## 2022-04-27 ENCOUNTER — Other Ambulatory Visit: Payer: PPO

## 2022-04-27 DIAGNOSIS — M25562 Pain in left knee: Secondary | ICD-10-CM | POA: Diagnosis not present

## 2022-04-27 DIAGNOSIS — Z96652 Presence of left artificial knee joint: Secondary | ICD-10-CM | POA: Diagnosis not present

## 2022-04-28 ENCOUNTER — Other Ambulatory Visit: Payer: PPO

## 2022-04-28 DIAGNOSIS — N138 Other obstructive and reflux uropathy: Secondary | ICD-10-CM | POA: Diagnosis not present

## 2022-04-28 DIAGNOSIS — N401 Enlarged prostate with lower urinary tract symptoms: Secondary | ICD-10-CM | POA: Diagnosis not present

## 2022-04-28 LAB — MICROSCOPIC EXAMINATION: Epithelial Cells (non renal): >10 /hpf — AB (ref 0–10)

## 2022-04-28 LAB — URINALYSIS, COMPLETE
Bilirubin, UA: NEGATIVE
Glucose, UA: NEGATIVE
Ketones, UA: NEGATIVE
Nitrite, UA: NEGATIVE
Protein,UA: NEGATIVE
RBC, UA: NEGATIVE
Specific Gravity, UA: 1.01 (ref 1.005–1.030)
Urobilinogen, Ur: 0.2 mg/dL (ref 0.2–1.0)
pH, UA: 6 (ref 5.0–7.5)

## 2022-05-01 ENCOUNTER — Encounter
Admission: RE | Admit: 2022-05-01 | Discharge: 2022-05-01 | Disposition: A | Discharge status: Home / Self Care | Payer: PPO | Source: Ambulatory Visit | Attending: Urology | Admitting: Urology

## 2022-05-01 NOTE — Patient Instructions (Addendum)
Your procedure is scheduled on: Tuesday, February 13 Report to the Registration Desk on the 1st floor of the [facility]. To find out your arrival time, please call [phone] between 1PM - 3PM on: Monday, February 12 If your arrival time is 6:00 am, do not arrive prior to that time as the [facility] entrance doors do not open until 6:00 am.  REMEMBER: Instructions that are not followed completely may result in serious medical risk, up to and including death; or upon the discretion of your surgeon and anesthesiologist your surgery may need to be rescheduled.  Do not eat or drink after midnight the night before surgery.  No gum chewing, lozengers or hard candies.  TAKE THESE MEDICATIONS THE MORNING OF SURGERY WITH A SIP OF WATER:  Ezetimibe (Zetia) Omeprazole (Prilosec) - (take one the night before and one on the morning of surgery - helps to prevent nausea after surgery.) Rosuvastatin (Crestor) Silodosin (Rapaflo)  Per Dr. Donivan Scull note: hold clopidogrel (Plavix) for 5 days before surgery. Last day to take is Wednesday, February 7. Resume AFTER surgery per surgeon instruction.  One week prior to surgery: starting February 6 Stop Anti-inflammatories (NSAIDS) such as Advil, Aleve, Ibuprofen, Motrin, Naproxen, Naprosyn and Aspirin based products such as Excedrin, Goodys Powder, BC Powder. Stop ANY OVER THE COUNTER supplements until after surgery. You may however, continue to take Tylenol if needed for pain up until the day of surgery.  No Alcohol for 24 hours before or after surgery.  No Smoking including e-cigarettes for 24 hours prior to surgery.  No chewable tobacco products for at least 6 hours prior to surgery.  No nicotine patches on the day of surgery.  Do not use any "recreational" drugs for at least a week prior to your surgery.  Please be advised that the combination of cocaine and anesthesia may have negative outcomes, up to and including death. If you test positive  for cocaine, your surgery will be cancelled.  On the morning of surgery brush your teeth with toothpaste and water, you may rinse your mouth with mouthwash if you wish. Do not swallow any toothpaste or mouthwash.  Do not wear jewelry, make-up, hairpins, clips or nail polish.  Do not wear lotions, powders, or perfumes.   Do not shave body from the neck down 48 hours prior to surgery just in case you cut yourself which could leave a site for infection.   Contact lenses, hearing aids and dentures may not be worn into surgery.  Do not bring valuables to the hospital. [facility] is not responsible for any missing/lost belongings or valuables.   Notify your doctor if there is any change in your medical condition (cold, fever, infection).  Wear comfortable clothing (specific to your surgery type) to the hospital.  After surgery, you can help prevent lung complications by doing breathing exercises.  Take deep breaths and cough every 1-2 hours. Your doctor may order a device called an Incentive Spirometer to help you take deep breaths.  If you are being discharged the day of surgery, you will not be allowed to drive home. You will need a responsible individual to drive you home and stay with you for 24 hours after surgery.   If you are taking public transportation, you will need to have a responsible adult (18 years or older) with you. Please confirm with your physician that it is acceptable to use public transportation.   Please call the [facility] at [phone] if you have  any questions about these instructions.  Surgery Visitation Policy:  Patients undergoing a surgery or procedure may have two family members or support persons with them as long as the person is not COVID-19 positive or experiencing its symptoms.

## 2022-05-02 DIAGNOSIS — Z96652 Presence of left artificial knee joint: Secondary | ICD-10-CM | POA: Diagnosis not present

## 2022-05-02 DIAGNOSIS — M25562 Pain in left knee: Secondary | ICD-10-CM | POA: Diagnosis not present

## 2022-05-02 LAB — CULTURE, URINE COMPREHENSIVE

## 2022-05-03 NOTE — Progress Notes (Signed)
Perioperative Services  Pre-Admission/Anesthesia Testing Clinical Review  Date: 05/03/22  Patient Demographics:  Name: Kevin Moore DOB:   Aug 29, 1950 MRN:   [id number]  Planned Surgical Procedure(s):    Case: [id number] Date/Time: 05/09/22 0714   Procedure: CYSTOSCOPY WITH INSERTION OF UROLIFT   Anesthesia type: Monitor Anesthesia Care   Pre-op diagnosis: Benign Prostatic Hyperplasia with Urinary Obstruction   Location: [facility] OR ROOM 10 / [place] [facility]S FOR ANESTHESIA GROUP   Surgeons: Abbie Sons, MD   NOTE: Available PAT nursing documentation and vital signs have been reviewed. Clinical nursing staff has updated patient's PMH/Moore, current medication list, and drug allergies/intolerances to ensure comprehensive history available to assist in medical decision making as it pertains to the aforementioned surgical procedure and anticipated anesthetic course. Extensive review of available clinical information performed. Kevin Moore updated with any diagnoses/procedures that  may have been inadvertently omitted during his intake with the pre-admission testing department's nursing staff.  Clinical Discussion:  Kevin Moore is a 72 y.o. male who is submitted for pre-surgical anesthesia review and clearance prior to him undergoing the above procedure. Patient has never been a smoker. Pertinent PMH includes: CAD, inferior STEMI, ventricular fibrillation (during cardiac catheterization), ventricular tachycardia (post cardiac catheterization), HTN, HLD, GERD, OA, recurrent bacterial prostatitis.  Patient is followed by cardiology Rockey Situ, MD). He was last seen in the cardiology clinic on 08/02/2021; notes reviewed. At the time of his clinic visit, patient doing well overall from a cardiovascular perspective. Patient denied any chest pain, shortness of breath, PND, orthopnea, palpitations, significant peripheral edema, weakness, fatigue, vertiginous symptoms, or  presyncope/syncope. Patient with a past medical history significant for cardiovascular diagnoses. Documented physical exam was grossly benign, providing no evidence of acute exacerbation and/or decompensation of the patient's known cardiovascular conditions.  Patient suffered an inferior wall STEMI on 10/17/2014.    Diagnostic LEFT heart catheterization was performed revealing multivessel CAD; less than 50% LAD, 50-60% proximal portion of the mid diagonal, 50% proximal LCx, 99% mid major marginal, and 80-90% stenosis of the proximal two thirds of the RCA.    During his catheterization, there was a spontaneous occlusion of the subtotal LCx causing patient to develop ventricular fibrillation.  Review of notes did not indicate cardiac arrest during this event.  Patient was defibrillated x 2 (200 J) prior to restoring NSR.  Procedure was continued and PCI was performed placing a 2.5 x 12 mm Xience Alpine DES x 1 to the LCx.  Following ventricular dysrhythmia, patient was started on an amiodarone drip, which resulted in bradycardia.  Patient was subsequently started on dopamine drip, however this medication caused shortness of breath and was discontinued.    While admitted to the ICU postcatheterization, patient developed polymorphic ventricular tachycardia.  Electrophysiology was consulted and lidocaine drip was ordered. Patient stabilized with the addition of the lidocaine. Given the refractory arrhythmias and frequent PVCs, which were felt to be secondary to known disease of the RCA, EP recommended PCI of the RCA. EP also made the recommendation to defer AICD placement at that point.   TTE performed on 10/17/2014 revealed a normal left ventricular systolic function with an EF of 55%.  There was hypokinesis of the basal posterolateral wall.  Trace tricuspid and mild mitral valve regurgitation noted.  There was mild aortic valve sclerosis with no evidence of stenosis.  Mild mitral annular calcification  observed.  PASP mildly elevated at 36 mmHg.  Patient underwent recommended staged PCI procedure on 10/20/2014 placing a 3.0  x 32 mm Synergy DES to the proximal RCA and a 2.5 x 38 mm Synergy DES to the distal RCA.  Procedure yielded excellent angiographic result and TIMI-3 flow.  Cardiac MRI performed on 02/04/2015 revealed a normal left ventricular systolic function with an EF of 66.7%.  Right ventricular size and function normal.  Left atrium was dilated.  Abnormal LGE noted with evidence of prior MI in the LCx/RCA territory.  There was subendocardial infarct and patchy scar to the basal to mid inferior and inferolateral walls in close proximity to the posteromedial papillary muscle.  Some portions of the scar appear to be transmural or nearly transmural.  Following MI and stent placement, patient remains on daily antiplatelet therapy using clopidogrel.  Patient is reportedly compliant with therapy with no evidence or reports of GI bleeding. Blood pressure mildly elevated at 146/82 mmHg on currently prescribed ARB (telmisartan) and diuretic (HCTZ) therapies. Patient is intolerant of beta-blockers as they resulted in profound bradycardia. Patient is on rosuvastatin + ezetimibe for his HLD diagnosis and further ASCVD prevention. He is not diabetic. Patient does not have an OSAH diagnosis. Patient continues to work and maintains an active lifestyle. Functional capacity somewhat limited by chronic knee pain, however with that being said, patient still felt to be able to achieve at least 4 METS of physical activity without experiencing any angina/anginal equivalent symptoms. No changes were made to his medication regimen. Patient to follow-up with outpatient cardiology and 1 year or sooner if needed.  Kevin Moore is scheduled for a CYSTOSCOPY WITH INSERTION OF UROLIFT on 05/09/2022 with Dr. John Giovanni, MD. Given patient's past medical history significant for cardiovascular diagnoses, presurgical  cardiac clearance was sought by the PAT team. Per cardiology, "based ACC/AHA guidelines, the patient's past medical history, and the amount of time since his last clinic visit, this patient would be at an overall ACCEPTABLE risk for the planned procedure without further cardiovascular testing or intervention at this time".   In review of his medication reconciliation, it is noted that patient is currently on prescribed daily antiplatelet therapy. He has been instructed on recommendations for holding his clopidogrel for 5 days prior to his procedure with plans to restart as soon as postoperative bleeding risk felt to be minimized by his attending surgeon. The patient has been instructed that his last dose of his clopidogrel should be on 05/03/2022.  Patient denies previous perioperative complications with anesthesia in the past.  In review his EMR, patient underwent a general anesthetic course (ASA III) here at [facility] in 03/2022 with no documented complications.      05/01/2022    1:30 PM 04/19/2022    2:17 PM 04/18/2022    9:21 AM  Vitals with BMI  Height '6\' 1"'$  '6\' 0"'$  '6\' 2"'$   Weight 215 lbs 215 lbs 215 lbs 6 oz  BMI 28.37 61.60 73.71  Systolic  062 694  Diastolic  88 76  Pulse  93 76    Providers/Specialists:   NOTE: Primary physician provider listed below. Patient may have been seen by APP or partner within same practice.   PROVIDER ROLE / SPECIALTY LAST Claud Kelp, MD Urology (Surgeon) 04/21/2022  Burnard Hawthorne, FNP Primary Care Provider 04/18/2022  Ida Rogue, MD Cardiology 08/02/2021   Allergies:  Beta adrenergic blockers  Current Home Medications:   No current facility-administered medications for this encounter.    Ascorbic Acid (VITAMIN C WITH ROSE HIPS) 500 MG tablet  Cholecalciferol 25 MCG (1000 UT) capsule   clopidogrel (PLAVIX) 75 MG tablet   ezetimibe (ZETIA) 10 MG tablet   Magnesium 250 MG TABS   omeprazole  (PRILOSEC) 20 MG capsule   rosuvastatin (CRESTOR) 40 MG tablet   silodosin (RAPAFLO) 8 MG CAPS capsule   telmisartan-hydrochlorothiazide (MICARDIS HCT) 40-12.5 MG tablet   zinc gluconate 50 MG tablet   History:   Past Medical History:  Diagnosis Date   Acute ST elevation myocardial infarction (STEMI) of inferior wall ([place]) 10/17/2014   a.) LHC 10/17/2014: <50% LAD, 50-60% prox mid diag, 50% pLCx, 99% mid major marg, 80-90% prox 2/3s RCA - during cath, spontaneous occ of subtotal LCx caused VF (defib 200J x 2). PCI of LCx (2.5 x 12 mm Xience Alpine). Started on amio gtt (bradycardia). Started on dopamine (SOB - d/c'd). Post-cath, dev WCT/polymorphic VT. EP consulted and lidocaine gtt started; b.) PCI of RCA (DES x2) on 10/20/2014   Beta-blocker intolerance    a.) causes profound bradycardia   BPH without urinary obstruction 03/2022   Coronary artery disease    a.) LHC 10/17/2014: <50% LAD, 50-60% prox portion of mid diagonal, 50% pLCx, 99% mid major marginal, 80-90% proximal 2/3s RCA  --> PCI performed placing a 2.5 x 12 mm Xience Alpine DES x 1 mLCx.; b.) PCI 10/20/2014: 80-90% o-mRCA --> 3.0 x 32 mm (proximal) and 2.5 x 38 mm (distal) Synergy DES placed.   GERD (gastroesophageal reflux disease)    History of colonic polyps    HLD (hyperlipidemia)    Hypertension    Long term current use of antithrombotics/antiplatelets    a.) clopidogrel   Melanoma ([place])    Osteoarthritis    Polymorphic ventricular tachycardia ([place]) 10/17/2014   a.) post cardiac catheterization --> developed WCT/polymorphic VT. EP consulted and lidocaine gtt started --> felt to be secondary to known RCA disease. PCI of RCA recommended; no AICD deemed necessary at that time.   Prostatitis    Ventricular fibrillation ([place]) 10/17/2014   a.) in setting of STEMI 10/17/2014 --> during cath, spontaneous occlusion of subtotal LCx caused VF requring defib 200J x 2. PCI of LCx (2.5 x 12 mm Xience Alpine DES). Started on  amiodarone gtt, which caused bradycardia. Started on dopamine, which caused SOB (d/c'd).   Past Surgical History:  Procedure Laterality Date   COLON SURGERY     partial removal of colon with perforation due to suspected fish bone.   CORONARY ANGIOPLASTY WITH STENT PLACEMENT  10/17/2014   Procedure: CORONARY ANGIOPLASTY WITH STENT PLACEMENT (2.5 x 12 mm Xience Alpine to [employer]); Location: [facility]   CORONARY STENT INTERVENTION Left 10/20/2014   Procedure: CORONARY STENT INTERVENTION (3.0 x 32 mm Synergy DES pRCA, 2.5 x 38 mm Synergy DES dRCA); Location: [facility]   ESOPHAGEAL DILATION     KNEE ARTHROSCOPY Left    TONSILLECTOMY     TOTAL KNEE ARTHROPLASTY Left 03/28/2022   Procedure: TOTAL KNEE ARTHROPLASTY;  Surgeon: Corky Mull, MD;  Location: [facility] ORS;  Service: Orthopedics;  Laterality: Left;   Family History  Problem Relation Age of Onset   Hyperlipidemia Mother    Cancer Mother        Ovarian, died age 61   Heart disease Father        bypass at age 53   Protein C deficiency Brother    Epilepsy Son    Social History   Tobacco Use   Smoking status: Never    Passive exposure: Never   Smokeless  tobacco: Never  Vaping Use   Vaping Use: Never used  Substance Use Topics   Alcohol use: Yes    Alcohol/week: 3.0 standard drinks of alcohol    Types: 3 Cans of beer per week    Comment: occassional beer   Drug use: No    Pertinent Clinical Results:  LABS: Labs reviewed: Acceptable for surgery.  Lab Results  Component Value Date   WBC 13.9 (H) 04/07/2022   HGB 12.5 (L) 04/07/2022   HCT 37.8 (L) 04/07/2022   MCV 90.0 04/07/2022   PLT 341 04/07/2022   Lab Results  Component Value Date   NA 131 (L) 04/18/2022   K 4.5 04/18/2022   CO2 26 04/18/2022   GLUCOSE 82 04/18/2022   BUN 19 04/18/2022   CREATININE 0.94 04/18/2022   CALCIUM 9.5 04/18/2022   GFRNONAA >60 04/07/2022   No visits with results within 3 Day(s) from this visit.  Latest known visit with results is:  Appointment  on 04/28/2022  Component Date Value Ref Range Status   Specific Gravity, UA 04/28/2022 1.010  1.005 - 1.030 Final   pH, UA 04/28/2022 6.0  5.0 - 7.5 Final   Color, UA 04/28/2022 Yellow  Yellow Final   Appearance Ur 04/28/2022 Hazy (A)  Clear Final   Leukocytes,UA 04/28/2022 Trace (A)  Negative Final   Protein,UA 04/28/2022 Negative  Negative/Trace Final   Glucose, UA 04/28/2022 Negative  Negative Final   Ketones, UA 04/28/2022 Negative  Negative Final   RBC, UA 04/28/2022 Negative  Negative Final   Bilirubin, UA 04/28/2022 Negative  Negative Final   Urobilinogen, Ur 04/28/2022 0.2  0.2 - 1.0 mg/dL Final   Nitrite, UA 04/28/2022 Negative  Negative Final   Microscopic Examination 04/28/2022 See below:   Final   WBC, UA 04/28/2022 0-5  0 - 5 /hpf Final   RBC, Urine 04/28/2022 0-2  0 - 2 /hpf Final   Epithelial Cells (non renal) 04/28/2022 >10 (A)  0 - 10 /hpf Final   Casts 04/28/2022 Present (A)  None seen /lpf Final   Cast Type 04/28/2022 Granular casts (A)  N/A Final   Bacteria, UA 04/28/2022 Few  None seen/Few Final   Urine Culture, Comprehensive 04/28/2022 Final report   Final   Organism ID, Bacteria 04/28/2022 Comment   Final   No growth in 36 - 48 hours.    ECG: Date: 03/15/2022 Time ECG obtained: 1405 PM Rate: 55 bpm Rhythm:  Sinus bradycardia with short PR Axis (leads I and aVF): Normal Intervals: PR 108 ms. QRS 88 ms. QTc 4 3 ms. ST segment and T wave changes: Nonacute T wave abnormality in lead III.   Comparison: Similar to previous tracing obtained on 08/02/2021   IMAGING / PROCEDURES: PROSTATE TRANSRECTAL ULTRASOUND SIZING performed on 04/19/2022 Transrectal probe was placed without difficulty Transrectal Ultrasound performed revealing a 58 gm prostate measuring 3.6 x 5.7 x 5.4 cm (length) No significant hypoechoic or median lobe noted  Moderate prostate enlargement on ultrasound He would be a candidate for HoLEP as we have previously discussed however would also  be a UroLift candidate. UroLift was discussed in detail and he was provided literature.  MR CARD MORPHOLOGY WO/W CM performed on 02/04/2015 Abnormal delayed enhancement with evidence of prior MI in the LCx/RCA territory.  There is subendocardial infarct and patchy scar to the basal to mid inferior and inferolateral walls in close proximity to the posteromedial papillary muscle.  Some portions of the scar appear transmural or near transmural.  Normal left ventricular systolic function with an EF of 66.7% The right ventricle is normal in size with normal systolic function Dilated left atrium Normal rest perfusion  LEFT HEART CATHETERIZATION AND CORONARY ANGIOGRAPHY performed on 10/20/2014 Diffuse stenosis of the RCA from the ostium to the mid vessel of up to 80-90%.  Successful PCI to the ostial/proximal RCA and mid RCA with placement of overlapping Synergy 3.0 x 32 mm (proximal) and Synergy 2.5 x 38 mm (distal) drug eluting stents with excellent angiographic result and TIMI 3 flow. Recommendations:  Aggressive secondary prevention.  Dual antiplatelet therapy for at least 12 months, ideally longer.   TRANSTHORACIC ECHOCARDIOGRAM performed on 10/17/2014 Normal left ventricular systolic function with an EF of 55% Hypokinesis of the basal posterolateral wall Normal left ventricular diastolic Doppler parameters Normal right ventricular systolic function Mild mitral valve regurgitation Trace tricuspid valve regurgitation Mildly elevated PASP = 36 mmHg Normal transvalvular gradients; no valvular stenosis No pericardial effusion  Impression and Plan:  Kevin Moore has been referred for pre-anesthesia review and clearance prior to him undergoing the planned anesthetic and procedural courses. Available labs, pertinent testing, and imaging results were personally reviewed by me in preparation for upcoming operative/procedural course.  This patient has been appropriately cleared by cardiology  with an overall ACCEPTABLE risk of significant perioperative cardiovascular complications. Based on clinical review performed today (05/03/22), barring any significant acute changes in the patient's overall condition, it is anticipated that he will be able to proceed with the planned surgical intervention. Any acute changes in clinical condition may necessitate his procedure being postponed and/or cancelled. Patient will meet with anesthesia team (MD and/or CRNA) on the day of his procedure for preoperative evaluation/assessment. Questions regarding anesthetic course will be fielded at that time.   Pre-surgical instructions were reviewed with the patient during his PAT appointment, and questions were fielded to satisfaction by PAT clinical staff. He has been instructed on which medications that he will need to hold prior to surgery, as well as the ones that have been deemed safe/appropriate to take of the day of his procedure. As part of the general education provided by PAT, patient made aware both verbally and in writing, that he would need to abstain from the use of any illegal substances during '@HIS'$ @ perioperative course. He was advised that failure to follow the provided instructions could necessitate case cancellation or result serious perioperative complications up to and including death. Patient encouraged to contact PAT and/or his surgeon's office to discuss any questions or concerns that may arise prior to surgery; verbalized understanding.   [name], MSN, APRN, FNP-C, CEN [facility]  Peri-operative Services Nurse Practitioner Phone: [phone] Fax: [phone] 05/03/22 11:56 AM  NOTE: This note has been prepared using Dragon dictation software. Despite my best ability to proofread, there is always the potential that unintentional transcriptional errors may still occur from this process.

## 2022-05-04 DIAGNOSIS — M25562 Pain in left knee: Secondary | ICD-10-CM | POA: Diagnosis not present

## 2022-05-04 DIAGNOSIS — Z96652 Presence of left artificial knee joint: Secondary | ICD-10-CM | POA: Diagnosis not present

## 2022-05-08 ENCOUNTER — Other Ambulatory Visit (INDEPENDENT_AMBULATORY_CARE_PROVIDER_SITE_OTHER): Payer: PPO

## 2022-05-08 DIAGNOSIS — R899 Unspecified abnormal finding in specimens from other organs, systems and tissues: Secondary | ICD-10-CM

## 2022-05-08 DIAGNOSIS — M25562 Pain in left knee: Secondary | ICD-10-CM | POA: Diagnosis not present

## 2022-05-08 DIAGNOSIS — Z96652 Presence of left artificial knee joint: Secondary | ICD-10-CM | POA: Diagnosis not present

## 2022-05-08 LAB — COMPREHENSIVE METABOLIC PANEL
ALT: 28 U/L (ref 0–53)
AST: 16 U/L (ref 0–37)
Albumin: 3.8 g/dL (ref 3.5–5.2)
Alkaline Phosphatase: 108 U/L (ref 39–117)
BUN: 12 mg/dL (ref 6–23)
CO2: 29 mEq/L (ref 19–32)
Calcium: 9.4 mg/dL (ref 8.4–10.5)
Chloride: 103 mEq/L (ref 96–112)
Creatinine, Ser: 0.91 mg/dL (ref 0.40–1.50)
GFR: 85.79 mL/min (ref >60.00)
Glucose, Bld: 113 mg/dL — ABNORMAL HIGH (ref 70–99)
Potassium: 4 mEq/L (ref 3.5–5.1)
Sodium: 142 mEq/L (ref 135–145)
Total Bilirubin: 0.6 mg/dL (ref 0.2–1.2)
Total Protein: 6.1 g/dL (ref 6.0–8.3)

## 2022-05-08 LAB — GAMMA GT: GGT: 37 U/L (ref 7–51)

## 2022-05-08 MED ORDER — LACTATED RINGERS IV SOLN: INTRAVENOUS | Status: DC

## 2022-05-08 MED ORDER — CEFAZOLIN SODIUM-DEXTROSE 2-4 GM/100ML-% IV SOLN
2.0000 g | INTRAVENOUS | Status: AC
  Administered 2022-05-09: 2 g via INTRAVENOUS

## 2022-05-08 MED ORDER — CHLORHEXIDINE GLUCONATE 0.12 % MT SOLN: 15.0000 mL | Freq: Once | OROMUCOSAL | Status: AC

## 2022-05-08 MED ORDER — ORAL CARE MOUTH RINSE: 15.0000 mL | Freq: Once | OROMUCOSAL | Status: AC

## 2022-05-09 ENCOUNTER — Encounter: Payer: Self-pay | Admitting: Urology

## 2022-05-09 ENCOUNTER — Other Ambulatory Visit: Payer: Self-pay

## 2022-05-09 ENCOUNTER — Encounter
Admission: RE | Disposition: A | Discharge status: Home / Self Care | Payer: Self-pay | Source: Home / Self Care | Attending: Urology

## 2022-05-09 ENCOUNTER — Ambulatory Visit: Payer: PPO | Admitting: Urgent Care

## 2022-05-09 ENCOUNTER — Ambulatory Visit
Admission: RE | Admit: 2022-05-09 | Discharge: 2022-05-09 | Disposition: A | Discharge status: Home / Self Care | Payer: PPO | Attending: Urology | Admitting: Urology

## 2022-05-09 DIAGNOSIS — N401 Enlarged prostate with lower urinary tract symptoms: Secondary | ICD-10-CM

## 2022-05-09 DIAGNOSIS — Z96652 Presence of left artificial knee joint: Secondary | ICD-10-CM | POA: Insufficient documentation

## 2022-05-09 DIAGNOSIS — K219 Gastro-esophageal reflux disease without esophagitis: Secondary | ICD-10-CM | POA: Diagnosis not present

## 2022-05-09 DIAGNOSIS — I10 Essential (primary) hypertension: Secondary | ICD-10-CM | POA: Insufficient documentation

## 2022-05-09 DIAGNOSIS — Z7902 Long term (current) use of antithrombotics/antiplatelets: Secondary | ICD-10-CM | POA: Diagnosis not present

## 2022-05-09 DIAGNOSIS — R338 Other retention of urine: Secondary | ICD-10-CM | POA: Insufficient documentation

## 2022-05-09 DIAGNOSIS — N138 Other obstructive and reflux uropathy: Secondary | ICD-10-CM | POA: Diagnosis not present

## 2022-05-09 DIAGNOSIS — R339 Retention of urine, unspecified: Secondary | ICD-10-CM

## 2022-05-09 DIAGNOSIS — I2510 Atherosclerotic heart disease of native coronary artery without angina pectoris: Secondary | ICD-10-CM | POA: Diagnosis not present

## 2022-05-09 DIAGNOSIS — I252 Old myocardial infarction: Secondary | ICD-10-CM | POA: Diagnosis not present

## 2022-05-09 HISTORY — PX: CYSTOSCOPY WITH INSERTION OF UROLIFT: SHX6678

## 2022-05-09 SURGERY — CYSTOSCOPY WITH INSERTION OF UROLIFT
Anesthesia: General | Site: Prostate

## 2022-05-09 MED ORDER — OXYCODONE HCL 5 MG PO TABS
ORAL_TABLET | ORAL | Status: AC
  Filled 2022-05-09: qty 1

## 2022-05-09 MED ORDER — STERILE WATER FOR IRRIGATION IR SOLN
Status: DC | PRN
  Administered 2022-05-09 (×2): 3000 mL via INTRAVESICAL

## 2022-05-09 MED ORDER — ONDANSETRON HCL 4 MG/2ML IJ SOLN: 4.0000 mg | Freq: Once | INTRAMUSCULAR | Status: DC | PRN

## 2022-05-09 MED ORDER — ACETAMINOPHEN 10 MG/ML IV SOLN: 1000.0000 mg | Freq: Once | INTRAVENOUS | Status: DC | PRN

## 2022-05-09 MED ORDER — LACTATED RINGERS IV SOLN: INTRAVENOUS | Status: DC | PRN

## 2022-05-09 MED ORDER — PROPOFOL 1000 MG/100ML IV EMUL
INTRAVENOUS | Status: AC
  Filled 2022-05-09: qty 100

## 2022-05-09 MED ORDER — LIDOCAINE HCL URETHRAL/MUCOSAL 2 % EX GEL
CUTANEOUS | Status: AC
  Filled 2022-05-09: qty 10

## 2022-05-09 MED ORDER — SULFAMETHOXAZOLE-TRIMETHOPRIM 800-160 MG PO TABS: 1.0000 | ORAL_TABLET | Freq: Two times a day (BID) | ORAL | 0 refills | Status: AC

## 2022-05-09 MED ORDER — CHLORHEXIDINE GLUCONATE 0.12 % MT SOLN
OROMUCOSAL | Status: AC
  Administered 2022-05-09: 15 mL via OROMUCOSAL
  Filled 2022-05-09: qty 15

## 2022-05-09 MED ORDER — PROPOFOL 10 MG/ML IV BOLUS
INTRAVENOUS | Status: AC
  Filled 2022-05-09: qty 20

## 2022-05-09 MED ORDER — PROPOFOL 500 MG/50ML IV EMUL
INTRAVENOUS | Status: DC | PRN
  Administered 2022-05-09: 50 mg via INTRAVENOUS
  Administered 2022-05-09: 30 mg via INTRAVENOUS
  Administered 2022-05-09: 150 ug/kg/min via INTRAVENOUS
  Administered 2022-05-09: 50 mg via INTRAVENOUS

## 2022-05-09 MED ORDER — CEFAZOLIN SODIUM-DEXTROSE 2-4 GM/100ML-% IV SOLN
INTRAVENOUS | Status: AC
  Filled 2022-05-09: qty 100

## 2022-05-09 MED ORDER — FENTANYL CITRATE (PF) 100 MCG/2ML IJ SOLN
INTRAMUSCULAR | Status: DC | PRN
  Administered 2022-05-09: 25 ug via INTRAVENOUS
  Administered 2022-05-09: 50 ug via INTRAVENOUS
  Administered 2022-05-09: 25 ug via INTRAVENOUS

## 2022-05-09 MED ORDER — FENTANYL CITRATE (PF) 100 MCG/2ML IJ SOLN: 25.0000 ug | INTRAMUSCULAR | Status: DC | PRN

## 2022-05-09 MED ORDER — FENTANYL CITRATE (PF) 100 MCG/2ML IJ SOLN
INTRAMUSCULAR | Status: AC
  Filled 2022-05-09: qty 2

## 2022-05-09 MED ORDER — LACTATED RINGERS IV SOLN: INTRAVENOUS | Status: DC

## 2022-05-09 MED ORDER — OXYCODONE HCL 5 MG/5ML PO SOLN: 5.0000 mg | Freq: Once | ORAL | Status: AC | PRN

## 2022-05-09 MED ORDER — ONDANSETRON HCL 4 MG/2ML IJ SOLN
INTRAMUSCULAR | Status: DC | PRN
  Administered 2022-05-09: 4 mg via INTRAVENOUS

## 2022-05-09 MED ORDER — OXYCODONE HCL 5 MG PO TABS
5.0000 mg | ORAL_TABLET | Freq: Once | ORAL | Status: AC | PRN
  Administered 2022-05-09: 5 mg via ORAL

## 2022-05-09 SURGICAL SUPPLY — 20 items
BAG DRAIN SIEMENS DORNER NS (MISCELLANEOUS) ×1 IMPLANT
BAG DRN NS LF (MISCELLANEOUS) ×1
BRUSH SCRUB EZ  4% CHG (MISCELLANEOUS) ×1
BRUSH SCRUB EZ 4% CHG (MISCELLANEOUS) ×1 IMPLANT
CATH FOLEY 2WAY SIL 16X30 (CATHETERS) IMPLANT
GAUZE 4X4 16PLY ~~LOC~~+RFID DBL (SPONGE) ×2 IMPLANT
GLOVE SURG UNDER POLY LF SZ7.5 (GLOVE) ×1 IMPLANT
GOWN STRL REUS W/ TWL LRG LVL3 (GOWN DISPOSABLE) ×1 IMPLANT
GOWN STRL REUS W/ TWL XL LVL3 (GOWN DISPOSABLE) ×1 IMPLANT
GOWN STRL REUS W/TWL LRG LVL3 (GOWN DISPOSABLE) ×1
GOWN STRL REUS W/TWL XL LVL3 (GOWN DISPOSABLE) ×1
HOLDER FOLEY CATH W/STRAP (MISCELLANEOUS) IMPLANT
KIT TURNOVER CYSTO (KITS) ×1 IMPLANT
PACK CYSTO AR (MISCELLANEOUS) ×1 IMPLANT
SET CYSTO W/LG BORE CLAMP LF (SET/KITS/TRAYS/PACK) ×1 IMPLANT
SURGILUBE 2OZ TUBE FLIPTOP (MISCELLANEOUS) IMPLANT
SYSTEM UROLIFT 2 CART W/ HNDL (Male Continence) ×1 IMPLANT
SYSTEM UROLIFT 2 CARTRIDGE (Male Continence) IMPLANT
WATER STERILE IRR 3000ML UROMA (IV SOLUTION) ×1 IMPLANT
WATER STERILE IRR 500ML POUR (IV SOLUTION) ×1 IMPLANT

## 2022-05-09 NOTE — Anesthesia Preprocedure Evaluation (Addendum)
Anesthesia Evaluation  Patient identified by MRN, date of birth, ID band Patient awake    Reviewed: Allergy & Precautions, NPO status , Patient's Chart, lab work & pertinent test results  History of Anesthesia Complications Negative for: history of anesthetic complications  Airway Mallampati: III   Neck ROM: Full    Dental no notable dental hx.    Pulmonary neg pulmonary ROS   Pulmonary exam normal breath sounds clear to auscultation       Cardiovascular hypertension, + CAD (s/p MI and stents on Plavix)  Normal cardiovascular exam Rhythm:Regular Rate:Normal  ECG 03/15/22: SB with short PR; unchanged TWI   Neuro/Psych negative neurological ROS     GI/Hepatic ,GERD  ,,  Endo/Other  negative endocrine ROS    Renal/GU negative Renal ROS   BPH    Musculoskeletal  (+) Arthritis ,  Melanoma    Abdominal   Peds  Hematology negative hematology ROS (+)   Anesthesia Other Findings Reviewed and agree with Bayard Males pre-anesthesia clinical review note.   Cardiology note 03/21/22 (prior to TKA):  1.  Preoperative Cardiovascular Risk Assessment:   The patient was advised that if he develops new symptoms prior to surgery to contact our office to arrange for a follow-up visit, and he verbalized understanding.    According to the Revised Cardiac Risk Index (RCRI), his Perioperative Risk of Major Cardiac Event is (%): 0.9   His Functional Capacity in METs is: 8.97 according to the [employer] Activity Status Index (DASI).   Therefore, based on ACC/AHA guidelines, patient would be at acceptable risk for the planned procedure without further cardiovascular testing.    Recommendations: According to ACC/AHA guidelines, no further cardiovascular testing needed.  The patient may proceed to surgery at acceptable risk.     Cardiology note 04/25/22:  Primary Cardiologist: Ida Rogue, MD   Chart reviewed as part of pre-operative  protocol coverage. Given past medical history and time since last visit, based on ACC/AHA guidelines, Daylon Rohlfs Kinsel would be at acceptable risk for the planned procedure without further cardiovascular testing.    I contacted the patient by phone since he was recently cleared for knee surgery. He reports he has been doing well with no concerning cardiac symptoms. He resumed Plavix after taking a course of Eliquis as prescribed by orthopedic surgeon. He was advised that if he develops new symptoms prior to surgery to contact our office to arrange a follow-up appointment. He verbalized understanding.   Per office protocol, he may hold Plavix for 5 days prior to procedure and should resume as soon as hemodynamically stable postoperatively.     I will route this recommendation to the requesting party via Epic fax function and remove from pre-op pool.   Please call with questions.   Emmaline Life, NP-C   Reproductive/Obstetrics                             Anesthesia Physical Anesthesia Plan  ASA: 3  Anesthesia Plan: General   Post-op Pain Management:    Induction: Intravenous  PONV Risk Score and Plan: 2 and Propofol infusion, TIVA, Treatment may vary due to age or medical condition and Ondansetron  Airway Management Planned: Natural Airway  Additional Equipment:   Intra-op Plan:   Post-operative Plan:   Informed Consent: I have reviewed the patients History and Physical, chart, labs and discussed the procedure including the risks, benefits and alternatives for the proposed anesthesia with  the patient or authorized representative who has indicated his/her understanding and acceptance.       Plan Discussed with: CRNA  Anesthesia Plan Comments: (LMA/GETA backup discussed.  Patient consented for risks of anesthesia including but not limited to:  - adverse reactions to medications - damage to eyes, teeth, lips or other oral mucosa - nerve  damage due to positioning  - sore throat or hoarseness - damage to heart, brain, nerves, lungs, other parts of body or loss of life  Informed patient about role of CRNA in peri- and intra-operative care.  Patient voiced understanding.)        Anesthesia Quick Evaluation

## 2022-05-09 NOTE — Discharge Instructions (Addendum)
AMBULATORY SURGERY  DISCHARGE INSTRUCTIONS   The drugs that you were given will stay in your system until tomorrow so for the next 24 hours you should not:  Drive an automobile Make any legal decisions Drink any alcoholic beverage   You may resume regular meals tomorrow.  Today it is better to start with liquids and gradually work up to solid foods.  You may eat anything you prefer, but it is better to start with liquids, then soup and crackers, and gradually work up to solid foods.   Please notify your doctor immediately if you have any unusual bleeding, trouble breathing, redness and pain at the surgery site, drainage, fever, or pain not relieved by medication.     Your post-operative visit with Dr.                                       is: Date:                        Time:    Please call to schedule your post-operative visit.  Additional Instructions:  Urolift Post-Operative Instructions   1. Mild hematuria for about 1 week.  2. Dysuria, frequency, and urgency for 10 days.  3. Mild pelvic pain 1-2 weeks.      Return to Activity     1. Drink water post procedure.  2. Take meds as needed.  3. No lifting or straining 48hrs.  4. Other activity when they feel up to it.     Medications     1. NSAIDS as needed.  2. Pyridium / Uribel for dysuria x10 days as needed  3. Continue BPH Medications ( if on any ) for 10-14 days.     Follow-Up     1. Catheter removal schedule 05/11/2022  2. Visit at 4wks, 22moand annually with bladder scan.

## 2022-05-09 NOTE — Transfer of Care (Signed)
Immediate Anesthesia Transfer of Care Note  Patient: Kevin Moore  Procedure(s) Performed: CYSTOSCOPY WITH INSERTION OF UROLIFT (Prostate)  Patient Location: PACU  Anesthesia Type:MAC  Level of Consciousness: awake  Airway & Oxygen Therapy: Patient Spontanous Breathing and Patient connected to face mask oxygen  Post-op Assessment: Report given to RN and Post -op Vital signs reviewed and stable  Post vital signs: Reviewed  Last Vitals:  Vitals Value Taken Time  BP 114/70 05/09/22 0820  Temp 98.11F   Pulse 67 05/09/22 0822  Resp 20 05/09/22 0822  SpO2 98 % 05/09/22 0822  Vitals shown include unvalidated device data.  Last Pain:  Vitals:   05/09/22 0647  TempSrc: Core (Comment)  PainSc:          Complications: No notable events documented.

## 2022-05-09 NOTE — Op Note (Signed)
   Preoperative diagnosis: BPH with urinary retention  Postoperative diagnosis: Same  Procedure: Urolift (placement of 6 implants)  Surgeon: Bernardo Heater  Anesthesia: MAC  Complications: None  Drains: 69 French Foley catheter  Estimated blood loss: < 5 mL  Indications: 72 y.o. male with a history of BPH with incomplete bladder emptying and prostatitis.  Status post left total knee replacement 03/28/2022 with postoperative urinary retention.  He has failed voiding trials x 3.  Management options including TURP with resection/ablation of the prostate as well as Urolift were discussed.  The patient has chosen to have a Urolift procedure.  He has been instructed to the procedure as well as risks and complications which include but are not limited to infection, bleeding, and inadequate treatment with the Urolift procedure alone, anesthetic complications, among others.  He understands these and desires to proceed.  Findings: Urethra normal in caliber without stricture.  Moderate lateral lobe enlargement.  Bladder mucosa with mild-moderate trabeculation.  No solid or papillary lesions.  Description of procedure: The patient was properly identified in the holding area.  He received preoperative IV antibiotics.  He was taken to the operating room where IV send patient was obtained by anesthesia.  He was placed in the dorsolithotomy position.  Genitalia and perineum were prepped and draped.  Proper timeout was performed.  A 72F cystoscope was inserted into the bladder. The cystoscopy bridge was replaced with a UroLift delivery device.  The 1st pair of implants were placed ~ 2 cm from the bladder neck and the anterior aspect of the prostatic urethra at the 2:00/10:00 positions.  The 2nd pair of implants were placed anteriorly at the verumontanum.  The right distal implant was remarkable for a bone strike and the implant did not fire necessitating a new implant at this position. The delivery device was then  replaced with keyhole insert and cystoscopy was performed.  A continuous anterior channel was noted with the cystoscope positioned at the verumontanum.  In the midportion of the urethra on the left a small portion of adenoma slightly encroach the anterior channel and an additional implant was placed in this area.  Final cystoscopy performed after this implant remarkable for a continuous anterior channel with the cystoscope position at the verumontanum.    A total of 6 implants were used (5 deployed; 1 bone strike/9 deployed).  He was performing CIC preoperatively and a 88 French Foley catheter was placed to gravity drainage  He tolerated procedure well and was transported to the PACU in stable condition.  Plan: Office follow-up 05/11/2022 for catheter removal/voiding trial   John Giovanni. MD

## 2022-05-09 NOTE — Anesthesia Postprocedure Evaluation (Signed)
Anesthesia Post Note  Patient: Kevin Moore  Procedure(s) Performed: CYSTOSCOPY WITH INSERTION OF UROLIFT (Prostate)  Patient location during evaluation: PACU Anesthesia Type: General Level of consciousness: awake and alert, oriented and patient cooperative Pain management: pain level controlled Vital Signs Assessment: post-procedure vital signs reviewed and stable Respiratory status: spontaneous breathing, nonlabored ventilation and respiratory function stable Cardiovascular status: blood pressure returned to baseline and stable Postop Assessment: adequate PO intake Anesthetic complications: no   No notable events documented.   Last Vitals:  Vitals:   05/09/22 0900 05/09/22 0912  BP: (!) 147/79 (!) 159/83  Pulse: 61 (!) 55  Resp: (!) 21 20  Temp: 36.7 C (!) 36.1 C  SpO2: 97% 99%    Last Pain:  Vitals:   05/09/22 0912  TempSrc: Temporal  PainSc: 0-No pain                 Darrin Nipper

## 2022-05-09 NOTE — Interval H&P Note (Signed)
History and Physical Interval Note:  CV:RRR Lungs:clear  05/09/2022 7:18 AM  Kevin Moore  has presented today for surgery, with the diagnosis of Benign Prostatic Hyperplasia with Urinary Obstruction.  The various methods of treatment have been discussed with the patient and family. After consideration of risks, benefits and other options for treatment, the patient has consented to  Procedure(s): CYSTOSCOPY WITH INSERTION OF UROLIFT (N/A) as a surgical intervention.  The patient's history has been reviewed, patient examined, no change in status, stable for surgery.  I have reviewed the patient's chart and labs.  Questions were answered to the patient's satisfaction.     [name]

## 2022-05-10 ENCOUNTER — Encounter: Payer: Self-pay | Admitting: Urology

## 2022-05-11 ENCOUNTER — Ambulatory Visit: Payer: PPO | Admitting: Physician Assistant

## 2022-05-11 DIAGNOSIS — N138 Other obstructive and reflux uropathy: Secondary | ICD-10-CM | POA: Diagnosis not present

## 2022-05-11 DIAGNOSIS — N401 Enlarged prostate with lower urinary tract symptoms: Secondary | ICD-10-CM

## 2022-05-11 LAB — BLADDER SCAN AMB NON-IMAGING: Scan Result: 214

## 2022-05-11 NOTE — Patient Instructions (Signed)
Congratulations on your recent Urolift procedure! As discussed in clinic today, some common postoperative findings include: Burning or pain with urination Lower abdominal/pelvic pain Blood in the urine Urinary leakage Typically, these are mild and resolve within 2-4 weeks of surgery. If you have any concerns, please contact our office.

## 2022-05-11 NOTE — Progress Notes (Signed)
Afternoon follow-up  Patient returned to clinic this afternoon for repeat PVR. He has been able to urinate several times. PVR 257m.  Results for orders placed or performed in visit on 05/11/22  BLADDER SCAN AMB NON-IMAGING  Result Value Ref Range   Scan Result 214     Voiding trial passed. Counseled patient on normal postop findings including dysuria/pelvic pain, hematuria, and urinary urgency/leakage.  Follow up: Return in about 4 weeks (around 06/08/2022) for Postop f/u with PVR.

## 2022-05-11 NOTE — Progress Notes (Signed)
Catheter Removal  Patient is present today for a catheter removal.  59m of water was drained from the balloon. A 16FR foley cath was removed from the bladder, no complications were noted. Patient tolerated well.  Performed by: CElberta Leatherwood CMA  Follow up/ Additional notes: PM PVR

## 2022-05-12 DIAGNOSIS — Z96652 Presence of left artificial knee joint: Secondary | ICD-10-CM | POA: Diagnosis not present

## 2022-05-12 DIAGNOSIS — M1732 Unilateral post-traumatic osteoarthritis, left knee: Secondary | ICD-10-CM | POA: Diagnosis not present

## 2022-05-12 LAB — ALKALINE PHOSPHATASE, ISOENZYMES
Alkaline Phosphatase: 126 IU/L — ABNORMAL HIGH (ref 44–121)
BONE FRACTION: 22 % (ref 12–68)
INTESTINAL FRAC.: 7 % (ref 0–18)
LIVER FRACTION: 71 % (ref 13–88)

## 2022-05-17 DIAGNOSIS — M25562 Pain in left knee: Secondary | ICD-10-CM | POA: Diagnosis not present

## 2022-05-17 DIAGNOSIS — Z96652 Presence of left artificial knee joint: Secondary | ICD-10-CM | POA: Diagnosis not present

## 2022-05-19 ENCOUNTER — Telehealth: Payer: Self-pay

## 2022-05-19 DIAGNOSIS — Z96652 Presence of left artificial knee joint: Secondary | ICD-10-CM | POA: Diagnosis not present

## 2022-05-19 DIAGNOSIS — M25562 Pain in left knee: Secondary | ICD-10-CM | POA: Diagnosis not present

## 2022-05-19 NOTE — Telephone Encounter (Signed)
LM FOR PT TO CB     Kevin Moore,   Alkaline phosphatase has improved from 3 weeks ago.  It does appear to be more of liver etiology.  Recommend a liver ultrasound and to repeat this lab in 6 weeks.   I know that you have a lot going on.  No particular rush in obtaining a right upper quadrant ultrasound.  Please let me know if you would like me to order. We can schedule a month or out.  Please let me know as we well when you would like to repeat labs.     Regards, Joycelyn Schmid

## 2022-05-23 ENCOUNTER — Other Ambulatory Visit: Payer: Self-pay | Admitting: Family

## 2022-05-23 DIAGNOSIS — R899 Unspecified abnormal finding in specimens from other organs, systems and tissues: Secondary | ICD-10-CM

## 2022-05-25 DIAGNOSIS — Z96652 Presence of left artificial knee joint: Secondary | ICD-10-CM | POA: Diagnosis not present

## 2022-05-30 DIAGNOSIS — Z96652 Presence of left artificial knee joint: Secondary | ICD-10-CM | POA: Diagnosis not present

## 2022-05-30 DIAGNOSIS — M25562 Pain in left knee: Secondary | ICD-10-CM | POA: Diagnosis not present

## 2022-06-01 DIAGNOSIS — M25562 Pain in left knee: Secondary | ICD-10-CM | POA: Diagnosis not present

## 2022-06-01 DIAGNOSIS — Z96652 Presence of left artificial knee joint: Secondary | ICD-10-CM | POA: Diagnosis not present

## 2022-06-05 DIAGNOSIS — M25562 Pain in left knee: Secondary | ICD-10-CM | POA: Diagnosis not present

## 2022-06-05 DIAGNOSIS — Z96652 Presence of left artificial knee joint: Secondary | ICD-10-CM | POA: Diagnosis not present

## 2022-06-08 DIAGNOSIS — M25562 Pain in left knee: Secondary | ICD-10-CM | POA: Diagnosis not present

## 2022-06-08 DIAGNOSIS — Z96652 Presence of left artificial knee joint: Secondary | ICD-10-CM | POA: Diagnosis not present

## 2022-06-12 ENCOUNTER — Ambulatory Visit: Payer: PPO | Admitting: Urology

## 2022-06-12 ENCOUNTER — Encounter: Payer: Self-pay | Admitting: Urology

## 2022-06-12 VITALS — BP 125/83 | HR 80 | Ht 74.0 in | Wt 215.0 lb

## 2022-06-12 DIAGNOSIS — N138 Other obstructive and reflux uropathy: Secondary | ICD-10-CM

## 2022-06-12 DIAGNOSIS — N401 Enlarged prostate with lower urinary tract symptoms: Secondary | ICD-10-CM | POA: Diagnosis not present

## 2022-06-12 LAB — BLADDER SCAN AMB NON-IMAGING

## 2022-06-12 NOTE — Progress Notes (Signed)
06/12/2022 3:08 PM   Kevin Moore [birth year] [id number]  Referring provider: Burnard Hawthorne, FNP [address] [place],  [place] [address]  Chief Complaint  Patient presents with   Urinary Retention    HPI: 72 y.o. male presents for postop follow-up  History of BPH with incomplete bladder emptying but developed symptomatic urinary retention after total knee replacement and failed several voiding trials Underwent UroLift 05/09/2022 States he is voiding with an excellent stream and no bothersome frequency, urgency, dysuria Stopped alpha-blocker 2 weeks ago PVR today 196 mL   PMH: Past Medical History:  Diagnosis Date   Acute ST elevation myocardial infarction (STEMI) of inferior wall ([place]) 10/17/2014   a.) LHC 10/17/2014: <50% LAD, 50-60% prox mid diag, 50% pLCx, 99% mid major marg, 80-90% prox 2/3s RCA - during cath, spontaneous occ of subtotal LCx caused VF (defib 200J x 2). PCI of LCx (2.5 x 12 mm Xience Alpine). Started on amio gtt (bradycardia). Started on dopamine (SOB - d/c'd). Post-cath, dev WCT/polymorphic VT. EP consulted and lidocaine gtt started; b.) PCI of RCA (DES x2) on 10/20/2014   Beta-blocker intolerance    a.) causes profound bradycardia   BPH without urinary obstruction 03/2022   Coronary artery disease    a.) LHC 10/17/2014: <50% LAD, 50-60% prox portion of mid diagonal, 50% pLCx, 99% mid major marginal, 80-90% proximal 2/3s RCA  --> PCI performed placing a 2.5 x 12 mm Xience Alpine DES x 1 mLCx.; b.) PCI 10/20/2014: 80-90% o-mRCA --> 3.0 x 32 mm (proximal) and 2.5 x 38 mm (distal) Synergy DES placed.   GERD (gastroesophageal reflux disease)    History of colonic polyps    HLD (hyperlipidemia)    Hypertension    Long term current use of antithrombotics/antiplatelets    a.) clopidogrel   Melanoma ([place])    Osteoarthritis    Polymorphic ventricular tachycardia ([place]) 10/17/2014   a.) post cardiac catheterization --> developed  WCT/polymorphic VT. EP consulted and lidocaine gtt started --> felt to be secondary to known RCA disease. PCI of RCA recommended; no AICD deemed necessary at that time.   Prostatitis    Ventricular fibrillation ([place]) 10/17/2014   a.) in setting of STEMI 10/17/2014 --> during cath, spontaneous occlusion of subtotal LCx caused VF requring defib 200J x 2. PCI of LCx (2.5 x 12 mm Xience Alpine DES). Started on amiodarone gtt, which caused bradycardia. Started on dopamine, which caused SOB (d/c'd).    Surgical History: Past Surgical History:  Procedure Laterality Date   COLON SURGERY     partial removal of colon with perforation due to suspected fish bone.   CORONARY ANGIOPLASTY WITH STENT PLACEMENT  10/17/2014   Procedure: CORONARY ANGIOPLASTY WITH STENT PLACEMENT (2.5 x 12 mm Xience Alpine to [employer]); Location: [facility]   CORONARY STENT INTERVENTION Left 10/20/2014   Procedure: CORONARY STENT INTERVENTION (3.0 x 32 mm Synergy DES pRCA, 2.5 x 38 mm Synergy DES dRCA); Location: [facility]   CYSTOSCOPY WITH INSERTION OF UROLIFT N/A 05/09/2022   Procedure: CYSTOSCOPY WITH INSERTION OF UROLIFT;  Surgeon: Abbie Sons, MD;  Location: [facility];  Service: Urology;  Laterality: N/A;   ESOPHAGEAL DILATION     KNEE ARTHROSCOPY Left    TONSILLECTOMY     TOTAL KNEE ARTHROPLASTY Left 03/28/2022   Procedure: TOTAL KNEE ARTHROPLASTY;  Surgeon: Corky Mull, MD;  Location: [facility];  Service: Orthopedics;  Laterality: Left;    Home Medications:  Allergies as of 06/12/2022  Reactions   Beta Adrenergic Blockers Other (See Comments)   Heart rate drops into the 20s if he takes beta blockers.        Medication List        Accurate as of June 12, 2022  3:08 PM. If you have any questions, ask your nurse or doctor.          Cholecalciferol 25 MCG (1000 UT) capsule Take 1,000 Units by mouth daily.   clopidogrel 75 MG tablet Commonly known as: PLAVIX Take 75 mg by mouth daily.   ezetimibe 10 MG  tablet Commonly known as: ZETIA TAKE 1 TABLET BY MOUTH DAILY   Magnesium 250 MG Tabs Take 1 tablet by mouth daily.   omeprazole 20 MG capsule Commonly known as: PRILOSEC Take 1 capsule (20 mg total) by mouth daily.   rosuvastatin 40 MG tablet Commonly known as: CRESTOR TAKE 1 TABLET BY MOUTH DAILY   silodosin 8 MG Caps capsule Commonly known as: RAPAFLO Take 1 capsule (8 mg total) by mouth daily with breakfast.   telmisartan-hydrochlorothiazide 40-12.5 MG tablet Commonly known as: MICARDIS HCT TAKE 1 TABLET BY MOUTH DAILY   vitamin C with rose hips 500 MG tablet Take 500 mg by mouth as needed.   zinc gluconate 50 MG tablet Take 50 mg by mouth as needed.        Allergies:  Allergies  Allergen Reactions   Beta Adrenergic Blockers Other (See Comments)    Heart rate drops into the 20s if he takes beta blockers.    Family History: Family History  Problem Relation Age of Onset   Hyperlipidemia Mother    Cancer Mother        Ovarian, died age 21   Heart disease Father        bypass at age 56   Protein C deficiency Brother    Epilepsy Son     Social History:  reports that he has never smoked. He has never been exposed to tobacco smoke. He has never used smokeless tobacco. He reports current alcohol use of about 3.0 standard drinks of alcohol per week. He reports that he does not use drugs.   Physical Exam: BP 125/83 (BP Location: Left Arm, Patient Position: Sitting, Cuff Size: Large)   Pulse 80   Ht 6\' 2"  (1.88 m)   Wt 215 lb (97.5 kg)   BMI 27.60 kg/m   Constitutional:  Alert, No acute distress. Psychiatric: Normal mood and affect.   Assessment & Plan:    1. Benign prostatic hyperplasia with urinary obstruction Doing well status post UroLift PVR 196 mL which is baseline 6 month follow-up with PVR/IPSS   Abbie Sons, MD  [facility] [address], [place], [place] [address] [phone]

## 2022-06-15 DIAGNOSIS — D2261 Melanocytic nevi of right upper limb, including shoulder: Secondary | ICD-10-CM | POA: Diagnosis not present

## 2022-06-15 DIAGNOSIS — X32XXXA Exposure to sunlight, initial encounter: Secondary | ICD-10-CM | POA: Diagnosis not present

## 2022-06-15 DIAGNOSIS — D225 Melanocytic nevi of trunk: Secondary | ICD-10-CM | POA: Diagnosis not present

## 2022-06-15 DIAGNOSIS — D2272 Melanocytic nevi of left lower limb, including hip: Secondary | ICD-10-CM | POA: Diagnosis not present

## 2022-06-15 DIAGNOSIS — C44212 Basal cell carcinoma of skin of right ear and external auricular canal: Secondary | ICD-10-CM | POA: Diagnosis not present

## 2022-06-15 DIAGNOSIS — D2262 Melanocytic nevi of left upper limb, including shoulder: Secondary | ICD-10-CM | POA: Diagnosis not present

## 2022-06-15 DIAGNOSIS — L570 Actinic keratosis: Secondary | ICD-10-CM | POA: Diagnosis not present

## 2022-06-15 DIAGNOSIS — D485 Neoplasm of uncertain behavior of skin: Secondary | ICD-10-CM | POA: Diagnosis not present

## 2022-06-15 DIAGNOSIS — Z85828 Personal history of other malignant neoplasm of skin: Secondary | ICD-10-CM | POA: Diagnosis not present

## 2022-06-20 ENCOUNTER — Other Ambulatory Visit (INDEPENDENT_AMBULATORY_CARE_PROVIDER_SITE_OTHER): Payer: PPO

## 2022-06-20 DIAGNOSIS — R899 Unspecified abnormal finding in specimens from other organs, systems and tissues: Secondary | ICD-10-CM

## 2022-06-20 LAB — COMPREHENSIVE METABOLIC PANEL
ALT: 20 U/L (ref 0–53)
AST: 18 U/L (ref 0–37)
Albumin: 4 g/dL (ref 3.5–5.2)
Alkaline Phosphatase: 99 U/L (ref 39–117)
BUN: 10 mg/dL (ref 6–23)
CO2: 30 mEq/L (ref 19–32)
Calcium: 9.5 mg/dL (ref 8.4–10.5)
Chloride: 105 mEq/L (ref 96–112)
Creatinine, Ser: 0.89 mg/dL (ref 0.40–1.50)
GFR: 87.16 mL/min (ref >60.00)
Glucose, Bld: 113 mg/dL — ABNORMAL HIGH (ref 70–99)
Potassium: 4.2 mEq/L (ref 3.5–5.1)
Sodium: 141 mEq/L (ref 135–145)
Total Bilirubin: 0.5 mg/dL (ref 0.2–1.2)
Total Protein: 6.6 g/dL (ref 6.0–8.3)

## 2022-07-05 ENCOUNTER — Telehealth: Payer: Self-pay

## 2022-07-05 NOTE — Progress Notes (Signed)
This patient is appearing on the insurance-provided list for being at risk of failing the adherence measure for cholesterol and hypertension (ACEi/ARB) medications this calendar year.   Medication: telmisartan-HCTZ (last filled 03/31/2022 for 90 day supply), rosuvastatin (last filled 06/13/2022 for 90 day supply)   Outreached patient to discuss adherence or any barriers to adherence. States he has plenty of the above medications on hand. He is due for a refill on telmisartan-HCTZ in a few days.   No intervention needed at this time.   Valeda Malm, Pharm.D. PGY-2 Ambulatory Care Pharmacy Resident

## 2022-07-25 DIAGNOSIS — C44212 Basal cell carcinoma of skin of right ear and external auricular canal: Secondary | ICD-10-CM | POA: Diagnosis not present

## 2022-07-25 DIAGNOSIS — D2321 Other benign neoplasm of skin of right ear and external auricular canal: Secondary | ICD-10-CM | POA: Diagnosis not present

## 2022-08-07 ENCOUNTER — Ambulatory Visit: Payer: PPO | Admitting: Urology

## 2022-08-07 NOTE — Progress Notes (Unsigned)
Cardiology Office Note  Date:  08/08/2022   ID:  Leeroy Bock Degregory, DOB May 29, 1950, MRN [id number]  PCP:  Allegra Grana, FNP   Chief Complaint  Patient presents with   12 month follow up     "Doing well." Medications reviewed by the patient verbally.     HPI:  Mr. 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 He had moderate to severe RCA disease  He  developed polymorphic VT thought secondary to the RCA  Previously unable to tolerate beta blocker as he develops profound bradycardia. EP was consulted. They recommended Lidocaine drip and stenting x2 of the RCA. once he was stented, he had no further ectopy.   Had MRI that showed scar from prior MI likely causing PVC's, EF 66%, Recommendation was made for no AICD.   PMH:   has a past medical history of Acute ST elevation myocardial infarction (STEMI) of inferior wall ([facility]) (10/17/2014), Beta-blocker intolerance, BPH without urinary obstruction  (03/2022), Coronary artery disease, GERD (gastroesophageal reflux disease), History of colonic polyps, HLD (hyperlipidemia), Hypertension, Long term current use of antithrombotics/antiplatelets, Melanoma (H[facility]), Osteoarthritis, Polymorphic ventricular tachycardia ([facility]) (10/17/2014), Prostatitis, and Ventricular fibrillation (H[facility]) (10/17/2014).  PSH:    Past Surgical History:  Procedure Laterality Date   COLON SURGERY     partial removal of colon with perforation due to suspected fish bone.   CORONARY ANGIOPLASTY WITH STENT PLACEMENT  10/17/2014   Procedure: CORONARY ANGIOPLASTY WITH STENT PLACEMENT (2.5 x 12 mm Xience Alpine to [employer]); Location: [facility]   CORONARY STENT INTERVENTION Left 10/20/2014   Procedure: CORONARY STENT INTERVENTION (3.0 x 32 mm Synergy DES pRCA, 2.5 x 38 mm Synergy DES dRCA); Location: [facility]   CYSTOSCOPY WITH INSERTION OF UROLIFT N/A 05/09/2022   Procedure: CYSTOSCOPY WITH INSERTION OF UROLIFT;  Surgeon: Riki Altes, MD;  Location: [facility];  Service: Urology;  Laterality: N/A;   ESOPHAGEAL DILATION     KNEE ARTHROSCOPY Left    TONSILLECTOMY     TOTAL KNEE ARTHROPLASTY Left 03/28/2022   Procedure: TOTAL KNEE ARTHROPLASTY;  Surgeon: Christena Flake, MD;  Location: [facility];  Service: Orthopedics;  Laterality: Left;    Current Outpatient Medications  Medication Sig Dispense Refill   Cholecalciferol 25 MCG (1000 UT) capsule Take 1,000 Units by mouth daily.     clopidogrel (PLAVIX) 75 MG tablet Take 75 mg by mouth daily.     ezetimibe (ZETIA) 10 MG tablet TAKE 1 TABLET BY MOUTH DAILY 30 tablet  11   Magnesium 250 MG TABS Take 1 tablet by mouth daily.     omeprazole (PRILOSEC) 20 MG capsule Take 1 capsule (20 mg total) by mouth daily. 30 capsule 11   rosuvastatin (CRESTOR) 40 MG tablet TAKE 1 TABLET BY MOUTH DAILY 90 tablet 3   telmisartan-hydrochlorothiazide (MICARDIS HCT) 40-12.5 MG tablet TAKE 1 TABLET BY MOUTH DAILY 90 tablet 3   silodosin (RAPAFLO) 8 MG CAPS capsule Take 1  capsule (8 mg total) by mouth daily with breakfast. (Patient not taking: Reported on 08/08/2022) 30 capsule 3   No current facility-administered medications for this visit.     Allergies:   Beta adrenergic blockers   Social History:  The patient  reports that he has never smoked. He has never been exposed to tobacco smoke. He has never used smokeless tobacco. He reports current alcohol use of about 3.0 standard drinks of alcohol per week. He reports that he does not use drugs.   Family History:   family history includes Cancer in his mother; Epilepsy in his son; Heart disease in his father; Hyperlipidemia in his mother; Protein C deficiency in his brother.    Review of Systems: Review of Systems  Constitutional: Negative.   Respiratory: Negative.    Cardiovascular: Negative.   Gastrointestinal: Negative.   Musculoskeletal:  Positive for joint pain.  Neurological: Negative.   Psychiatric/Behavioral: Negative.    All other systems reviewed and are negative.   PHYSICAL EXAM: VS:  BP 120/70 (BP Location: Left Arm, Patient Position: Sitting, Cuff Size: Normal)   Pulse (!) 55   Ht 6\' 2"  (1.88 m)   Wt 229 lb (103.9 kg)   SpO2 98%   BMI 29.40 kg/m  , BMI Body mass index is 29.4 kg/m. Constitutional:  oriented to person, place, and time. No distress.  HENT:  Head: Grossly normal Eyes:  no discharge. No scleral icterus.  Neck: No JVD, no carotid bruits  Cardiovascular: Regular rate and rhythm, no murmurs appreciated Pulmonary/Chest: Clear to auscultation bilaterally, no wheezes or rails Abdominal: Soft.  no distension.  no tenderness.  Musculoskeletal: Normal range of motion Neurological:  normal muscle tone. Coordination normal. No atrophy Skin: Skin warm and dry Psychiatric: normal affect, pleasant  Recent Labs: 04/07/2022: Hemoglobin 12.5; Platelets 341 06/20/2022: ALT 20; BUN 10; Creatinine, Ser 0.89; Potassium 4.2; Sodium 141    Lipid Panel Lab Results  Component Value  Date   CHOL 126 07/12/2021   HDL 44 07/12/2021   LDLCALC 63 07/12/2021   TRIG 105 07/12/2021      Wt Readings from Last 3 Encounters:  08/08/22 229 lb (103.9 kg)  06/12/22 215 lb (97.5 kg)  05/09/22 215 lb (97.5 kg)     ASSESSMENT AND PLAN:  Coronary artery disease involving native coronary artery of native heart with stable angina Currently with no symptoms of angina. No further workup at this time. Continue current medication regimen.  Mixed hyperlipidemia Possible myalgias on Lipitor Tolerating Crestor and Zetia, numbers reviewed, at goal No changes made  Essential (primary) hypertension Continue telmisartan HCTZ, blood pressure well-controlled Weight up several pounds from last year  Ventricular tachycardia (HCC)  prior history of V. fib arrest prior to stent placement left circumflex and RCA  Felt not to be a ICD candidate based on EP workup at [facility] 2016  Not on beta-blocker secondary to bradycardia Denies near-syncope or syncope or tachypalpitations  History of coronary artery stent placement Stent placed to RCA 2, left circumflex 1  Plavix, no  aspirin, cholesterol at goal no anginal symptoms Non-smoker, no diabetes   Total encounter time more than 30 minutes  Greater than 50% was spent in counseling and coordination of care with the patient   No orders of the defined types were placed in this encounter.    Signed, Dossie Arbour, M.D., Ph.D. 08/08/2022  [facility] [place], [place] [phone]

## 2022-08-08 ENCOUNTER — Ambulatory Visit: Payer: PPO | Attending: Cardiovascular Disease | Admitting: Cardiovascular Disease

## 2022-08-08 ENCOUNTER — Encounter: Payer: Self-pay | Admitting: Cardiovascular Disease

## 2022-08-08 VITALS — BP 120/70 | HR 55 | Ht 74.0 in | Wt 229.0 lb

## 2022-08-08 DIAGNOSIS — I472 Ventricular tachycardia, unspecified: Secondary | ICD-10-CM | POA: Diagnosis not present

## 2022-08-08 DIAGNOSIS — I25118 Atherosclerotic heart disease of native coronary artery with other forms of angina pectoris: Secondary | ICD-10-CM | POA: Diagnosis not present

## 2022-08-08 DIAGNOSIS — I10 Essential (primary) hypertension: Secondary | ICD-10-CM | POA: Diagnosis not present

## 2022-08-08 DIAGNOSIS — E782 Mixed hyperlipidemia: Secondary | ICD-10-CM

## 2022-08-08 DIAGNOSIS — I493 Ventricular premature depolarization: Secondary | ICD-10-CM | POA: Diagnosis not present

## 2022-08-08 MED ORDER — CLOPIDOGREL BISULFATE 75 MG PO TABS: 75.0000 mg | ORAL_TABLET | Freq: Every day | ORAL | 3 refills | Status: DC

## 2022-08-08 MED ORDER — EZETIMIBE 10 MG PO TABS: 10.0000 mg | ORAL_TABLET | Freq: Every day | ORAL | 3 refills | Status: DC

## 2022-08-08 MED ORDER — ROSUVASTATIN CALCIUM 40 MG PO TABS: 40.0000 mg | ORAL_TABLET | Freq: Every day | ORAL | 3 refills | Status: DC

## 2022-08-08 MED ORDER — TELMISARTAN-HCTZ 40-12.5 MG PO TABS: 1.0000 | ORAL_TABLET | Freq: Every day | ORAL | 3 refills | Status: DC

## 2022-08-08 NOTE — Patient Instructions (Addendum)
Kevin Moore: [phone]   Medication Instructions:  No changes  If you need a refill on your cardiac medications before your next appointment, please call your pharmacy.   Lab work: No new labs needed  Testing/Procedures: No new testing needed  Follow-Up: At [facility], you and your health needs are our priority.  As part of our continuing mission to provide you with exceptional heart care, we have created designated Provider Care Teams.  These Care Teams include your primary Cardiologist (physician) and Advanced Practice Providers (APPs -  Physician Assistants and Nurse Practitioners) who all work together to provide you with the care you need, when you need it.  You will need a follow up appointment in 12 months with Dr. Mariah Milling or APP team  COVID-19 Vaccine Information can be found at: [email or web address] For questions related to vaccine distribution or appointments, please email [email or web address] or call [phone].

## 2022-08-25 ENCOUNTER — Other Ambulatory Visit: Payer: Self-pay | Admitting: Cardiovascular Disease

## 2022-09-25 DIAGNOSIS — Z96652 Presence of left artificial knee joint: Secondary | ICD-10-CM | POA: Diagnosis not present

## 2022-09-25 DIAGNOSIS — M1732 Unilateral post-traumatic osteoarthritis, left knee: Secondary | ICD-10-CM | POA: Diagnosis not present

## 2022-10-17 ENCOUNTER — Encounter: Payer: Self-pay | Admitting: Family

## 2022-10-17 ENCOUNTER — Ambulatory Visit (INDEPENDENT_AMBULATORY_CARE_PROVIDER_SITE_OTHER): Payer: PPO | Admitting: Family

## 2022-10-17 ENCOUNTER — Telehealth: Payer: Self-pay

## 2022-10-17 VITALS — BP 118/80 | HR 60 | Temp 97.6°F | Ht 74.0 in | Wt 228.1 lb

## 2022-10-17 DIAGNOSIS — I10 Essential (primary) hypertension: Secondary | ICD-10-CM | POA: Diagnosis not present

## 2022-10-17 DIAGNOSIS — Z1211 Encounter for screening for malignant neoplasm of colon: Secondary | ICD-10-CM

## 2022-10-17 DIAGNOSIS — I25118 Atherosclerotic heart disease of native coronary artery with other forms of angina pectoris: Secondary | ICD-10-CM

## 2022-10-17 DIAGNOSIS — Z8639 Personal history of other endocrine, nutritional and metabolic disease: Secondary | ICD-10-CM | POA: Diagnosis not present

## 2022-10-17 NOTE — Progress Notes (Signed)
Assessment & Plan:  Coronary artery disease of native artery of native heart with stable angina pectoris [facility]) Assessment & Plan: Symptomatically stable.  Continue Plavix 75 mg daily, Crestor 40 mg daily as prescribed Dr. Mariah Milling Pending lipid panel   Orders: -     Lipid panel; Future  Screening for colon cancer -     Ambulatory referral to Gastroenterology  History of vitamin D deficiency -     VITAMIN D 25 Hydroxy (Vit-D Deficiency, Fractures); Future  Hypertension, unspecified type Assessment & Plan: Chronic, stable.  Continue micardis 40-12.5mg .       Return precautions given.   Risks, benefits, and alternatives of the medications and treatment plan prescribed today were discussed, and patient expressed understanding.   Education regarding symptom management and diagnosis given to patient on AVS either electronically or printed.  Return in about 6 months (around 04/19/2023) for Fasting labs in 2-3 weeks.  Rennie Plowman, FNP  Subjective:    Patient ID: Kevin Moore, male    DOB: Sep 11, 1950, 72 y.o.   MRN: [id number]  CC: Kevin Moore is a 72 y.o. male who presents today for follow up.   HPI: Feels well today.  No new complaints.  Very pleased with left knee replacement 03/2022.   Underwent UroLift 05/09/2022  Denies urinary hesitancy, decreased urine stream   Follow-up scheduled urology Dr Lonna Cobb Follow-up Dr. Mariah Milling 08/08/2022; continued on current regimen including Crestor, Zetia  Allergies: Beta adrenergic blockers Current Outpatient Medications on File Prior to Visit  Medication Sig Dispense Refill   Cholecalciferol 25 MCG (1000 UT) capsule Take 1,000 Units by mouth daily.     clopidogrel (PLAVIX) 75 MG tablet Take 1 tablet (75 mg total) by mouth daily. 90 tablet 3   ezetimibe (ZETIA) 10 MG tablet Take 1 tablet (10 mg total) by mouth daily. 90 tablet 3   Magnesium 250 MG TABS Take 1 tablet by mouth daily.     omeprazole (PRILOSEC) 20 MG  capsule TAKE 1 CAPSULE BY MOUTH DAILY 30 capsule 11   rosuvastatin (CRESTOR) 40 MG tablet Take 1 tablet (40 mg total) by mouth daily. 90 tablet 3   telmisartan-hydrochlorothiazide (MICARDIS HCT) 40-12.5 MG tablet Take 1 tablet by mouth daily. 90 tablet 3   No current facility-administered medications on file prior to visit.    Review of Systems  Constitutional:  Negative for chills and fever.  Respiratory:  Negative for cough.   Cardiovascular:  Negative for chest pain and palpitations.  Gastrointestinal:  Negative for nausea and vomiting.  Genitourinary:  Negative for decreased urine volume, difficulty urinating, frequency and urgency.      Objective:    BP 118/80   Pulse 60   Temp 97.6 F (36.4 C) (Oral)   Ht 6\' 2"  (1.88 m)   Wt 228 lb 1.6 oz (103.5 kg)   SpO2 97%   BMI 29.29 kg/m  BP Readings from Last 3 Encounters:  10/17/22 118/80  08/08/22 120/70  06/12/22 125/83   Wt Readings from Last 3 Encounters:  10/17/22 228 lb 1.6 oz (103.5 kg)  08/08/22 229 lb (103.9 kg)  06/12/22 215 lb (97.5 kg)    Physical Exam Vitals reviewed.  Constitutional:      Appearance: He is well-developed.  Cardiovascular:     Rate and Rhythm: Regular rhythm.     Heart sounds: Normal heart sounds.  Pulmonary:     Effort: Pulmonary effort is normal. No respiratory distress.     Breath sounds: Normal  breath sounds. No wheezing, rhonchi or rales.  Skin:    General: Skin is warm and dry.  Neurological:     Mental Status: He is alert.  Psychiatric:        Speech: Speech normal.        Behavior: Behavior normal.

## 2022-10-17 NOTE — Assessment & Plan Note (Signed)
Chronic, stable.  Continue micardis 40-12.5mg .

## 2022-10-17 NOTE — Telephone Encounter (Signed)
Gastroenterology Pre-Procedure Review  Request Date: TBD  Requesting Physician: Dr. Jodelle Gross  PATIENT REVIEW QUESTIONS: The patient responded to the following health history questions as indicated:    1. Are you having any GI issues? no 2. Do you have a personal history of Polyps? no 3. Do you have a family history of Colon Cancer or Polyps? no 4. Diabetes Mellitus? no 5. Joint replacements in the past 12 months?yes (left knee replacement, urolift) 6. Major health problems in the past 3 months?no 7. Any artificial heart valves, MVP, or defibrillator?no    MEDICATIONS & ALLERGIES:    Patient reports the following regarding taking any anticoagulation/antiplatelet therapy:   Plavix, Coumadin, Eliquis, Xarelto, Lovenox, Pradaxa, Brilinta, or Effient? yes (Plavix Dr. Mariah Milling prescribed) Aspirin? no  Patient confirms/reports the following medications:  Current Outpatient Medications  Medication Sig Dispense Refill   Cholecalciferol 25 MCG (1000 UT) capsule Take 1,000 Units by mouth daily.     clopidogrel (PLAVIX) 75 MG tablet Take 1 tablet (75 mg total) by mouth daily. 90 tablet 3   ezetimibe (ZETIA) 10 MG tablet Take 1 tablet (10 mg total) by mouth daily. 90 tablet 3   Magnesium 250 MG TABS Take 1 tablet by mouth daily.     omeprazole (PRILOSEC) 20 MG capsule TAKE 1 CAPSULE BY MOUTH DAILY 30 capsule 11   rosuvastatin (CRESTOR) 40 MG tablet Take 1 tablet (40 mg total) by mouth daily. 90 tablet 3   telmisartan-hydrochlorothiazide (MICARDIS HCT) 40-12.5 MG tablet Take 1 tablet by mouth daily. 90 tablet 3   No current facility-administered medications for this visit.    Patient confirms/reports the following allergies:  Allergies  Allergen Reactions   Beta Adrenergic Blockers Other (See Comments)    Heart rate drops into the 20s if he takes beta blockers.    No orders of the defined types were placed in this encounter.   AUTHORIZATION INFORMATION Primary Insurance: 1D#: Group  #:  Secondary Insurance: 1D#: Group #:  SCHEDULE INFORMATION: Date: TBD Time: Location: [facility]

## 2022-10-17 NOTE — Assessment & Plan Note (Signed)
Symptomatically stable.  Continue Plavix 75 mg daily, Crestor 40 mg daily as prescribed Dr. Mariah Milling Pending lipid panel

## 2022-10-20 ENCOUNTER — Other Ambulatory Visit: Payer: Self-pay

## 2022-10-20 ENCOUNTER — Telehealth: Payer: Self-pay | Admitting: *Deleted

## 2022-10-20 DIAGNOSIS — Z1211 Encounter for screening for malignant neoplasm of colon: Secondary | ICD-10-CM

## 2022-10-20 MED ORDER — NA SULFATE-K SULFATE-MG SULF 17.5-3.13-1.6 GM/177ML PO SOLN: 1.0000 | Freq: Once | ORAL | 0 refills | Status: AC

## 2022-10-20 NOTE — Telephone Encounter (Signed)
Patient called back to schedule his colonoscopy with Dr. Servando Snare.  Colonoscopy has been scheduled on Tuesday 12/05/22.  Cardiac Clearance with Medication Advice request has been sent to Cardiovascular Preop and Pharm Team.  Ancil Boozer, CMA

## 2022-10-20 NOTE — Telephone Encounter (Signed)
   Name: Kevin Moore  DOB: [birth year]  MRN: [id number]  Primary Cardiologist: Julien Nordmann, MD   Preoperative team, please contact this patient and set up a phone call appointment for further preoperative risk assessment. Please obtain consent and complete medication review. Thank you for your help.  I confirm that guidance regarding antiplatelet and oral anticoagulation therapy has been completed and, if necessary, noted below.  Per office protocol, if patient is without any new symptoms or concerns at the time of their virtual visit, he/she may hold Plavix for 5 days prior to procedure and ASA for 7 days. . Please resume ASA and Plavix  as soon as possible postprocedure, at the discretion of the surgeon.     Joni Reining, NP 10/20/2022, 11:26 AM [facility]

## 2022-10-20 NOTE — Telephone Encounter (Signed)
   Pre-operative Risk Assessment    Patient Name: Kevin Moore  DOB: 11-11-1950 MRN: [id number]      Request for Surgical Clearance    Procedure:   COLONOSCOPY  Date of Surgery:  Clearance 12/05/22                                 Surgeon:  DR. DARREN WOHL Surgeon's Group or Practice Name:  [facility] Phone number:  [phone] Fax number:  [phone] ATTN: MICHELLE, CMA   Type of Clearance Requested:   - Medical ; PLAVIX   Type of Anesthesia:  General    Additional requests/questions:    [name]   10/20/2022, 9:37 AM   Reason for letter: Clinical [name], CMA on 10/20/2022     [facility] at [facility] [address], [address] [place], [place]  [address] Phone:  [phone]   Fax:  [phone]   10/20/2022   Patient Name: Kevin Moore   DOB: [birth year]   To CV Preop Team and CV Preop Pharm Team:   [name] has been  scheduled for a colonoscopy on 12/05/22, under GENERAL anesthesia with Dr. [name].   Please FAX a note of Medical Clearance  with  Medication Advice (Plavix) to [phone], ATTENTION: [name], CMA.   To advise if this patient will require an office visit or further medical work-up before clearance can be given please call [phone] (desk number) and leave a message with the triage nurse.   Thank you      [facility]     _____  Patient is NOT cleared to have procedure     _____  Patient IS cleared to have procedure and should stop taking               __________________________ Plavix _____ days prior               and _____ restart.   Additional notes:_________________________________________________________   ______________________________________________________________________     _______________________________________________________ Physician Signature                                                    Date    [facility] at [facility] [place], [phone]

## 2022-10-20 NOTE — Telephone Encounter (Signed)
Left message to call back to set up tele pre op appt.  

## 2022-10-20 NOTE — Telephone Encounter (Signed)
-----   Message from Joni Reining sent at 10/20/2022  9:18 AM EDT ----- Regarding: FW: Clinical  ----- Message ----- From: [name], CMA Sent: 10/20/2022   9:00 AM EDT To: Cv Div Preop; Cv Div Preop Pharm Subject: Clinical

## 2022-10-24 ENCOUNTER — Telehealth: Payer: Self-pay | Admitting: *Deleted

## 2022-10-24 NOTE — Telephone Encounter (Signed)
   Name: Kevin Moore  DOB: 05-08-1950  MRN: [id number]  Primary Cardiologist: Julien Nordmann, MD   Preoperative team, please contact this patient and set up a phone call appointment for further preoperative risk assessment. Please obtain consent and complete medication review. Thank you for your help.  I confirm that guidance regarding antiplatelet and oral anticoagulation therapy has been completed and, if necessary, noted below.  Per protocol patient can hold Plavix 5 days prior to procedure and aspirin 7 days prior to procedure.  Medications should be resumed post procedure once hemostasis is achieved.   Napoleon Form, Leodis Rains, NP 10/24/2022, 12:23 PM [facility]

## 2022-10-24 NOTE — Telephone Encounter (Signed)
duplicate

## 2022-10-24 NOTE — Telephone Encounter (Signed)
   Pre-operative Risk Assessment    Patient Name: Kevin Moore  DOB: Apr 27, 1950 MRN: [id number]      Request for Surgical Clearance    Procedure:   COLONOSCOPY  Date of Surgery:  Clearance 12/05/22                                 Surgeon:  DR. DARREN WOHL Surgeon's Group or Practice Name:  [facility] Phone number:  [phone] Fax number:  [phone]   Type of Clearance Requested:   - Medical  - Pharmacy:  Hold Clopidogrel (Plavix) NOT INDICATED HOW LONG   Type of Anesthesia:  General    Additional requests/questions:    Wilhemina Cash   10/24/2022, 12:14 PM

## 2022-10-24 NOTE — Telephone Encounter (Signed)
2nd attempt to reach pt. I called and his vm came on, though before I could leave a vm the call disconnected.

## 2022-10-25 ENCOUNTER — Telehealth: Payer: Self-pay | Admitting: *Deleted

## 2022-10-25 NOTE — Telephone Encounter (Signed)
I s/w the pt and he has been scheduled for tele pre op appt 11/13/22 @ 2 pm. Med rec and consent are done.

## 2022-10-25 NOTE — Telephone Encounter (Signed)
I s/w the pt and he has been scheduled for tele pre op appt 11/13/22 @ 2 pm. Med rec and consent are done.     Patient Consent for Virtual Visit        Kevin Moore has provided verbal consent on 10/25/2022 for a virtual visit (video or telephone).   CONSENT FOR VIRTUAL VISIT FOR:  Kevin Moore  By participating in this virtual visit I agree to the following:  I hereby voluntarily request, consent and authorize [facility] and its employed or contracted physicians, physician assistants, nurse practitioners or other licensed health care professionals (the Practitioner), to provide me with telemedicine health care services (the "Services") as deemed necessary by the treating Practitioner. I acknowledge and consent to receive the Services by the Practitioner via telemedicine. I understand that the telemedicine visit will involve communicating with the Practitioner through live audiovisual communication technology and the disclosure of certain medical information by electronic transmission. I acknowledge that I have been given the opportunity to request an in-person assessment or other available alternative prior to the telemedicine visit and am voluntarily participating in the telemedicine visit.  I understand that I have the right to withhold or withdraw my consent to the use of telemedicine in the course of my care at any time, without affecting my right to future care or treatment, and that the Practitioner or I may terminate the telemedicine visit at any time. I understand that I have the right to inspect all information obtained and/or recorded in the course of the telemedicine visit and may receive copies of available information for a reasonable fee.  I understand that some of the potential risks of receiving the Services via telemedicine include:  Delay or interruption in medical evaluation due to technological equipment failure or disruption; Information transmitted  may not be sufficient (e.g. poor resolution of images) to allow for appropriate medical decision making by the Practitioner; and/or  In rare instances, security protocols could fail, causing a breach of personal health information.  Furthermore, I acknowledge that it is my responsibility to provide information about my medical history, conditions and care that is complete and accurate to the best of my ability. I acknowledge that Practitioner's advice, recommendations, and/or decision may be based on factors not within their control, such as incomplete or inaccurate data provided by me or distortions of diagnostic images or specimens that may result from electronic transmissions. I understand that the practice of medicine is not an exact science and that Practitioner makes no warranties or guarantees regarding treatment outcomes. I acknowledge that a copy of this consent can be made available to me via my patient portal [facility] MyChart), or I can request a printed copy by calling the office of [facility].    I understand that my insurance will be billed for this visit.   I have read or had this consent read to me. I understand the contents of this consent, which adequately explains the benefits and risks of the Services being provided via telemedicine.  I have been provided ample opportunity to ask questions regarding this consent and the Services and have had my questions answered to my satisfaction. I give my informed consent for the services to be provided through the use of telemedicine in my medical care

## 2022-11-01 ENCOUNTER — Ambulatory Visit (INDEPENDENT_AMBULATORY_CARE_PROVIDER_SITE_OTHER): Payer: PPO | Admitting: Emergency Medicine

## 2022-11-01 VITALS — Ht 74.0 in | Wt 223.0 lb

## 2022-11-01 DIAGNOSIS — Z0000 Encounter for general adult medical examination without abnormal findings: Secondary | ICD-10-CM

## 2022-11-01 NOTE — Progress Notes (Signed)
Subjective:   Kevin Moore is a 72 y.o. male who presents for Medicare Annual/Subsequent preventive examination.  Visit Complete: Virtual  I connected with  [name] on 11/01/22 by a audio enabled telemedicine application and verified that I am speaking with the correct person using two identifiers.  Patient Location: Work  Arts administrator  I discussed the limitations of evaluation and management by telemedicine. The patient expressed understanding and agreed to proceed.  Vital Signs: Unable to obtain new vitals due to this being a telehealth visit.   Review of Systems     Cardiac Risk Factors include: advanced age (>3men, >28 women);male gender;hypertension;dyslipidemia;Other (see comment), Risk factor comments: CAD     Objective:    Today's Vitals   11/01/22 0818  Weight: 223 lb (101.2 kg)  Height: 6\' 2"  (1.88 m)   Body mass index is 28.63 kg/m.     11/01/2022    8:26 AM 05/01/2022    1:32 PM 04/07/2022    2:14 PM 03/28/2022    6:16 AM 03/15/2022    1:25 PM 11/18/2021    2:11 PM 07/15/2020    9:36 AM  Advanced Directives  Does Patient Have a Medical Advance Directive? Yes Yes Yes Yes Yes Yes Yes  Type of Estate agent of [place];Living will Healthcare Power of [place];Living will Healthcare Power of [place];Living will Healthcare Power of [place];Living will  Living will   Does patient want to make changes to medical advance directive? No - Patient declined   No - Patient declined  No - Patient declined No - Patient declined  Copy of Healthcare Power of Attorney in Chart? No - copy requested No - copy requested  No - copy requested       Current Medications (verified) Outpatient Encounter Medications as of 11/01/2022  Medication Sig   Cholecalciferol 25 MCG (1000 UT) capsule Take 1,000 Units by mouth daily.   clopidogrel (PLAVIX) 75 MG tablet Take 1 tablet (75 mg total) by mouth daily.   ezetimibe (ZETIA) 10 MG  tablet Take 1 tablet (10 mg total) by mouth daily.   Magnesium 250 MG TABS Take 1 tablet by mouth daily.   omeprazole (PRILOSEC) 20 MG capsule TAKE 1 CAPSULE BY MOUTH DAILY   rosuvastatin (CRESTOR) 40 MG tablet Take 1 tablet (40 mg total) by mouth daily.   telmisartan-hydrochlorothiazide (MICARDIS HCT) 40-12.5 MG tablet Take 1 tablet by mouth daily.   No facility-administered encounter medications on file as of 11/01/2022.    Allergies (verified) Beta adrenergic blockers   History: Past Medical History:  Diagnosis Date   Acute ST elevation myocardial infarction (STEMI) of inferior wall (HCC) 10/17/2014   a.) LHC 10/17/2014: <50% LAD, 50-60% prox mid diag, 50% pLCx, 99% mid major marg, 80-90% prox 2/3s RCA - during cath, spontaneous occ of subtotal LCx caused VF (defib 200J x 2). PCI of LCx (2.5 x 12 mm Xience Alpine). Started on amio gtt (bradycardia). Started on dopamine (SOB - d/c'd). Post-cath, dev WCT/polymorphic VT. EP consulted and lidocaine gtt started; b.) PCI of RCA (DES x2) on 10/20/2014   Beta-blocker intolerance    a.) causes profound bradycardia   BPH without urinary obstruction 03/2022   Coronary artery disease    a.) LHC 10/17/2014: <50% LAD, 50-60% prox portion of mid diagonal, 50% pLCx, 99% mid major marginal, 80-90% proximal 2/3s RCA  --> PCI performed placing a 2.5 x 12 mm Xience Alpine DES x 1 mLCx.; b.) PCI 10/20/2014: 80-90%  o-mRCA --> 3.0 x 32 mm (proximal) and 2.5 x 38 mm (distal) Synergy DES placed.   GERD (gastroesophageal reflux disease)    History of colonic polyps    HLD (hyperlipidemia)    Hypertension    Long term current use of antithrombotics/antiplatelets    a.) clopidogrel   Melanoma (HCC)    Osteoarthritis    Polymorphic ventricular tachycardia (HCC) 10/17/2014   a.) post cardiac catheterization --> developed WCT/polymorphic VT. EP consulted and lidocaine gtt started --> felt to be secondary to known RCA disease. PCI of RCA recommended; no AICD deemed  necessary at that time.   Prostatitis    Ventricular fibrillation (HCC) 10/17/2014   a.) in setting of STEMI 10/17/2014 --> during cath, spontaneous occlusion of subtotal LCx caused VF requring defib 200J x 2. PCI of LCx (2.5 x 12 mm Xience Alpine DES). Started on amiodarone gtt, which caused bradycardia. Started on dopamine, which caused SOB (d/c'd).   Past Surgical History:  Procedure Laterality Date   COLON SURGERY     partial removal of colon with perforation due to suspected fish bone.   CORONARY ANGIOPLASTY WITH STENT PLACEMENT  10/17/2014   Procedure: CORONARY ANGIOPLASTY WITH STENT PLACEMENT (2.5 x 12 mm Xience Alpine to [employer]); Location: [facility]   CORONARY STENT INTERVENTION Left 10/20/2014   Procedure: CORONARY STENT INTERVENTION (3.0 x 32 mm Synergy DES pRCA, 2.5 x 38 mm Synergy DES dRCA); Location: [facility]   CYSTOSCOPY WITH INSERTION OF UROLIFT N/A 05/09/2022   Procedure: CYSTOSCOPY WITH INSERTION OF UROLIFT;  Surgeon: Riki Altes, MD;  Location: [facility];  Service: Urology;  Laterality: N/A;   ESOPHAGEAL DILATION     KNEE ARTHROSCOPY Left    TONSILLECTOMY     TOTAL KNEE ARTHROPLASTY Left 03/28/2022   Procedure: TOTAL KNEE ARTHROPLASTY;  Surgeon: Christena Flake, MD;  Location: [facility];  Service: Orthopedics;  Laterality: Left;   Family History  Problem Relation Age of Onset   Hyperlipidemia Mother    Cancer Mother        Ovarian, died age 46   Heart disease Father        bypass at age 88   Protein C deficiency Brother    Epilepsy Son    Social History   Socioeconomic History   Marital status: Married    Spouse name: RHONDA   Number of children: 2   Years of education: Not on file   Highest education level: Not on file  Occupational History   Not on file  Tobacco Use   Smoking status: Never    Passive exposure: Never   Smokeless tobacco: Never  Vaping Use   Vaping status: Never Used  Substance and Sexual Activity   Alcohol use: Yes    Alcohol/week: 3.0 standard  drinks of alcohol    Types: 3 Cans of beer per week    Comment: occassional beer 1 day a week   Drug use: No   Sexual activity: Yes  Other Topics Concern   Not on file  Social History Narrative   Not on file   Social Determinants of Health   Financial Resource Strain: Low Risk  (11/01/2022)   Overall Financial Resource Strain (CARDIA)    Difficulty of Paying Living Expenses: Not hard at all  Food Insecurity: No Food Insecurity (11/01/2022)   Hunger Vital Sign    Worried About Running Out of Food in the Last Year: Never true    Ran Out of Food in the Last Year:  Never true  Transportation Needs: No Transportation Needs (11/01/2022)   PRAPARE - Administrator, Civil Service (Medical): No    Lack of Transportation (Non-Medical): No  Physical Activity: Sufficiently Active (11/01/2022)   Exercise Vital Sign    Days of Exercise per Week: 5 days    Minutes of Exercise per Session: 30 min  Stress: No Stress Concern Present (11/01/2022)   [employer] of Occupational Health - Occupational Stress Questionnaire    Feeling of Stress : Not at all  Social Connections: Moderately Integrated (11/01/2022)   Social Connection and Isolation Panel [NHANES]    Frequency of Communication with Friends and Family: More than three times a week    Frequency of Social Gatherings with Friends and Family: Twice a week    Attends Religious Services: More than 4 times per year    Active Member of [employer] or Organizations: No    Attends Engineer, structural: Never    Marital Status: Married    Tobacco Counseling Counseling given: Not Answered   Clinical Intake:  Pre-visit preparation completed: Yes  Pain : No/denies pain     BMI - recorded: 28.63 Nutritional Status: BMI 25 -29 Overweight Nutritional Risks: None Diabetes: No  How often do you need to have someone help you when you read instructions, pamphlets, or other written materials from your doctor or pharmacy?: 1 -  Never  Interpreter Needed?: No  Information entered by :: Tora Kindred, CMA   Activities of Daily Living    11/01/2022    8:19 AM 05/01/2022    1:34 PM  In your present state of health, do you have any difficulty performing the following activities:  Hearing? 0   Vision? 0   Difficulty concentrating or making decisions? 0   Walking or climbing stairs? 0   Dressing or bathing? 0   Doing errands, shopping? 0 0  Preparing Food and eating ? N   Using the Toilet? N   In the past six months, have you accidently leaked urine? N   Do you have problems with loss of bowel control? N   Managing your Medications? N   Managing your Finances? N   Housekeeping or managing your Housekeeping? N     Patient Care Team: Allegra Grana, FNP as PCP - General (Family Medicine) Antonieta Iba, MD as PCP - Cardiology (Cardiology)  Indicate any recent Medical Services you may have received from other than [facility] providers in the past year (date may be approximate).     Assessment:   This is a routine wellness examination for [facility].  Hearing/Vision screen Hearing Screening - Comments:: Denies hearing loss  Dietary issues and exercise activities discussed:     Goals Addressed               This Visit's Progress     Patient Stated (pt-stated)        Would like to exercise more and lift weights      Depression Screen    11/01/2022    8:24 AM 10/17/2022    9:02 AM 04/18/2022    9:33 AM 07/15/2020    9:37 AM 06/14/2020    2:59 PM 04/01/2020    2:36 PM 10/17/2018   10:12 AM  PHQ 2/9 Scores  PHQ - 2 Score 0 0 0 0 0 0 0  PHQ- 9 Score 0          Fall Risk    11/01/2022    8:27  AM 10/17/2022    9:01 AM 04/18/2022    9:33 AM 07/15/2020    9:37 AM 06/14/2020    2:05 PM  Fall Risk   Falls in the past year? 0 0 0 0 0  Number falls in past yr: 0 0 0 0   Injury with Fall? 0 0 0 0   Risk for fall due to : No Fall Risks No Fall Risks No Fall Risks    Follow up Falls prevention discussed  Falls evaluation completed Falls evaluation completed Falls evaluation completed Falls evaluation completed    MEDICARE RISK AT HOME:  Medicare Risk at Home - 11/01/22 0827     Any stairs in or around the home? Yes    If so, are there any without handrails? Yes    Home free of loose throw rugs in walkways, pet beds, electrical cords, etc? Yes    Adequate lighting in your home to reduce risk of falls? Yes    Life alert? No    Use of a cane, walker or w/c? No    Grab bars in the bathroom? Yes    Shower chair or bench in shower? Yes    Elevated toilet seat or a handicapped toilet? Yes             TIMED UP AND GO:  Was the test performed?  No    Cognitive Function:        11/01/2022    8:28 AM  6CIT Screen  What Year? 0 points  What month? 0 points  What time? 0 points  Count back from 20 0 points  Months in reverse 0 points  Repeat phrase 0 points  Total Score 0 points    Immunizations Immunization History  Administered Date(s) Administered   Fluad Quad(high Dose 65+) 04/18/2022   Influenza, High Dose Seasonal PF 01/22/2020, 01/10/2021   Influenza,inj,Quad PF,6+ Mos 02/25/2015, 12/17/2017   Influenza-Unspecified 01/16/2019, 01/22/2020   PFIZER(Purple Top)SARS-COV-2 Vaccination 05/07/2019, 05/28/2019, 01/22/2020   PNEUMOCOCCAL CONJUGATE-20 06/14/2020   Pneumococcal Polysaccharide-23 10/17/2018   Tdap 07/25/2011   Zoster, Live 08/21/2012    TDAP status: Due, Education has been provided regarding the importance of this vaccine. Advised may receive this vaccine at local pharmacy or Health Dept. Aware to provide a copy of the vaccination record if obtained from local pharmacy or Health Dept. Verbalized acceptance and understanding.  Flu Vaccine status: Due, Education has been provided regarding the importance of this vaccine. Advised may receive this vaccine at local pharmacy or Health Dept. Aware to provide a copy of the vaccination record if obtained from local  pharmacy or Health Dept. Verbalized acceptance and understanding.  Pneumococcal vaccine status: Up to date  Covid-19 vaccine status: Declined, Education has been provided regarding the importance of this vaccine but patient still declined. Advised may receive this vaccine at local pharmacy or Health Dept.or vaccine clinic. Aware to provide a copy of the vaccination record if obtained from local pharmacy or Health Dept. Verbalized acceptance and understanding.  Qualifies for Shingles Vaccine? Yes   Zostavax completed Yes   Shingrix Completed?: No.    Education has been provided regarding the importance of this vaccine. Patient has been advised to call insurance company to determine out of pocket expense if they have not yet received this vaccine. Advised may also receive vaccine at local pharmacy or Health Dept. Verbalized acceptance and understanding.  Screening Tests Health Maintenance  Topic Date Due   Zoster Vaccines- Shingrix (1 of 2) 07[birth year]02/1971  Colonoscopy  09/10/2016   DTaP/Tdap/Td (2 - Td or Tdap) 07/24/2021   COVID-19 Vaccine (4 - 2023-24 season) 11/25/2021   INFLUENZA VACCINE  10/26/2022   Medicare Annual Wellness (AWV)  11/01/2023   Pneumonia Vaccine 83+ Years old  Completed   Hepatitis C Screening  Completed   HPV VACCINES  Aged Out    Health Maintenance  Health Maintenance Due  Topic Date Due   Zoster Vaccines- Shingrix (1 of 2) 07[birth year]02/1971   Colonoscopy  09/10/2016   DTaP/Tdap/Td (2 - Td or Tdap) 07/24/2021   COVID-19 Vaccine (4 - 2023-24 season) 11/25/2021   INFLUENZA VACCINE  10/26/2022    Colorectal cancer screening: Type of screening: Colonoscopy. Completed 10/11/11. Repeat every 5 years Patient has appointment Sept 10, 2024 for colonoscopy  Lung Cancer Screening: (Low Dose CT Chest recommended if Age 48-80 years, 20 pack-year currently smoking OR have quit w/in 15years.) does not qualify.   Lung Cancer Screening Referral: n/a  Additional  Screening:  Hepatitis C Screening: does not qualify; Completed 06/11/14  Vision Screening: Recommended annual ophthalmology exams for early detection of glaucoma and other disorders of the eye.  Dental Screening: Recommended annual dental exams for proper oral hygiene  Community Resource Referral / Chronic Care Management: CRR required this visit?  No   CCM required this visit?  No     Plan:     I have personally reviewed and noted the following in the patient's chart:   Medical and social history Use of alcohol, tobacco or illicit drugs  Current medications and supplements including opioid prescriptions. Patient is not currently taking opioid prescriptions. Functional ability and status Nutritional status Physical activity Advanced directives List of other physicians Hospitalizations, surgeries, and ER visits in previous 12 months Vitals Screenings to include cognitive, depression, and falls Referrals and appointments  In addition, I have reviewed and discussed with patient certain preventive protocols, quality metrics, and best practice recommendations. A written personalized care plan for preventive services as well as general preventive health recommendations were provided to patient.     Tora Kindred, CMA   11/01/2022   After Visit Summary: (MyChart) Due to this being a telephonic visit, the after visit summary with patients personalized plan was offered to patient via MyChart   Nurse Notes:  Patient has colonoscopy scheduled 12/05/22. Patient due for Tdap, flu and Shingrix. Patient declined any future Covid vaccines.

## 2022-11-01 NOTE — Patient Instructions (Addendum)
Kevin Moore , Thank you for taking time to come for your Medicare Wellness Visit. I appreciate your ongoing commitment to your health goals. Please review the following plan we discussed and let me know if I can assist you in the future.   Referrals/Orders/Follow-Ups/Clinician Recommendations: Recommend getting your flu shot this fall. Get Tetanus and shingles vaccines.  This is a list of the screening recommended for you and due dates:  Health Maintenance  Topic Date Due   Zoster (Shingles) Vaccine (1 of 2) 07/02/1971   Colon Cancer Screening  09/10/2016   DTaP/Tdap/Td vaccine (2 - Td or Tdap) 07/24/2021   Flu Shot  10/26/2022   Medicare Annual Wellness Visit  11/01/2023   Pneumonia Vaccine  Completed   Hepatitis C Screening  Completed   HPV Vaccine  Aged Out   COVID-19 Vaccine  Discontinued    Advanced directives: (Copy Requested) Please bring a copy of your health care power of attorney and living will to the office to be added to your chart at your convenience.  Next Medicare Annual Wellness Visit scheduled for next year: Yes, 11/07/23 @ 8:15am  Preventive Care 65 Years and Older, Male  Preventive care refers to lifestyle choices and visits with your health care provider that can promote health and wellness. What does preventive care include? A yearly physical exam. This is also called an annual well check. Dental exams once or twice a year. Routine eye exams. Ask your health care provider how often you should have your eyes checked. Personal lifestyle choices, including: Daily care of your teeth and gums. Regular physical activity. Eating a healthy diet. Avoiding tobacco and drug use. Limiting alcohol use. Practicing safe sex. Taking low doses of aspirin every day. Taking vitamin and mineral supplements as recommended by your health care provider. What happens during an annual well check? The services and screenings done by your health care provider during your annual  well check will depend on your age, overall health, lifestyle risk factors, and family history of disease. Counseling  Your health care provider may ask you questions about your: Alcohol use. Tobacco use. Drug use. Emotional well-being. Home and relationship well-being. Sexual activity. Eating habits. History of falls. Memory and ability to understand (cognition). Work and work Astronomer. Screening  You may have the following tests or measurements: Height, weight, and BMI. Blood pressure. Lipid and cholesterol levels. These may be checked every 5 years, or more frequently if you are over 59 years old. Skin check. Lung cancer screening. You may have this screening every year starting at age 70 if you have a 30-pack-year history of smoking and currently smoke or have quit within the past 15 years. Fecal occult blood test (FOBT) of the stool. You may have this test every year starting at age 70. of the stool. You may have this test every year starting at age 70. Flexible sigmoidoscopy or colonoscopy. You may have a sigmoidoscopy every 5 years or a colonoscopy every 10 years starting at age 72. Prostate cancer screening. Recommendations will vary depending on your family history and other risks. Hepatitis C blood test. Hepatitis B blood test. Sexually transmitted disease (STD) testing. Diabetes screening. This is done by checking your blood sugar (glucose) after you have not eaten for a while (fasting). You may have this done every 1-3 years. Abdominal aortic aneurysm (AAA) screening. You may need this if you are a current or former smoker. Osteoporosis. You may be screened starting at age 70 if you are at high risk. Talk with your health care provider about your test results, treatment options,  and if necessary, the need for more tests. Vaccines  Your health care provider may recommend certain vaccines, such as: Influenza vaccine. This is recommended every year. Tetanus, diphtheria, and acellular pertussis (Tdap, Td) vaccine. You may need a Td booster  every 10 years. Zoster vaccine. You may need this after age 38. Pneumococcal 13-valent conjugate (PCV13) vaccine. One dose is recommended after age 70. Pneumococcal polysaccharide (PPSV23) vaccine. One dose is recommended after age 70. Talk to your health care provider about which screenings and vaccines you need and how often you need them. This information is not intended to replace advice given to you by your health care provider. Make sure you discuss any questions you have with your health care provider. Document Released: 04/09/2015 Document Revised: 12/01/2015 Document Reviewed: 01/12/2015 Elsevier Interactive Patient Education  2017 [employer].  Fall Prevention in the Home Falls can cause injuries. They can happen to people of all ages. There are many things you can do to make your home safe and to help prevent falls. What can I do on the outside of my home? Regularly fix the edges of walkways and driveways and fix any cracks. Remove anything that might make you trip as you walk through a door, such as a raised step or threshold. Trim any bushes or trees on the path to your home. Use bright outdoor lighting. Clear any walking paths of anything that might make someone trip, such as rocks or tools. Regularly check to see if handrails are loose or broken. Make sure that both sides of any steps have handrails. Any raised decks and porches should have guardrails on the edges. Have any leaves, snow, or ice cleared regularly. Use sand or salt on walking paths during winter. Clean up any spills in your garage right away. This includes oil or grease spills. What can I do in the bathroom? Use night lights. Install grab bars by the toilet and in the tub and shower. Do not use towel bars as grab bars. Use non-skid mats or decals in the tub or shower. If you need to sit down in the shower, use a plastic, non-slip stool. Keep the floor dry. Clean up any water that spills on the floor as soon  as it happens. Remove soap buildup in the tub or shower regularly. Attach bath mats securely with double-sided non-slip rug tape. Do not have throw rugs and other things on the floor that can make you trip. What can I do in the bedroom? Use night lights. Make sure that you have a light by your bed that is easy to reach. Do not use any sheets or blankets that are too big for your bed. They should not hang down onto the floor. Have a firm chair that has side arms. You can use this for support while you get dressed. Do not have throw rugs and other things on the floor that can make you trip. What can I do in the kitchen? Clean up any spills right away. Avoid walking on wet floors. Keep items that you use a lot in easy-to-reach places. If you need to reach something above you, use a strong step stool that has a grab bar. Keep electrical cords out of the way. Do not use floor polish or wax that makes floors slippery. If you must use wax, use non-skid floor wax. Do not have throw rugs and other things on the floor that can make you trip. What can I do with my stairs? Do not leave  any items on the stairs. Make sure that there are handrails on both sides of the stairs and use them. Fix handrails that are broken or loose. Make sure that handrails are as long as the stairways. Check any carpeting to make sure that it is firmly attached to the stairs. Fix any carpet that is loose or worn. Avoid having throw rugs at the top or bottom of the stairs. If you do have throw rugs, attach them to the floor with carpet tape. Make sure that you have a light switch at the top of the stairs and the bottom of the stairs. If you do not have them, ask someone to add them for you. What else can I do to help prevent falls? Wear shoes that: Do not have high heels. Have rubber bottoms. Are comfortable and fit you well. Are closed at the toe. Do not wear sandals. If you use a stepladder: Make sure that it is fully  opened. Do not climb a closed stepladder. Make sure that both sides of the stepladder are locked into place. Ask someone to hold it for you, if possible. Clearly mark and make sure that you can see: Any grab bars or handrails. First and last steps. Where the edge of each step is. Use tools that help you move around (mobility aids) if they are needed. These include: Canes. Walkers. Scooters. Crutches. Turn on the lights when you go into a dark area. Replace any light bulbs as soon as they burn out. Set up your furniture so you have a clear path. Avoid moving your furniture around. If any of your floors are uneven, fix them. If there are any pets around you, be aware of where they are. Review your medicines with your doctor. Some medicines can make you feel dizzy. This can increase your chance of falling. Ask your doctor what other things that you can do to help prevent falls. This information is not intended to replace advice given to you by your health care provider. Make sure you discuss any questions you have with your health care provider. Document Released: 01/07/2009 Document Revised: 08/19/2015 Document Reviewed: 04/17/2014 Elsevier Interactive Patient Education  2017 [employer].

## 2022-11-07 ENCOUNTER — Other Ambulatory Visit: Payer: PPO

## 2022-11-13 ENCOUNTER — Ambulatory Visit: Payer: PPO | Attending: Cardiology

## 2022-11-13 DIAGNOSIS — Z01810 Encounter for preprocedural cardiovascular examination: Secondary | ICD-10-CM | POA: Diagnosis not present

## 2022-11-13 NOTE — Progress Notes (Signed)
Virtual Visit via Telephone Note   Because of Kevin Moore's co-morbid illnesses, he is at least at moderate risk for complications without adequate follow up.  This format is felt to be most appropriate for this patient at this time.  The patient did not have access to video technology/had technical difficulties with video requiring transitioning to audio format only (telephone).  All issues noted in this document were discussed and addressed.  No physical exam could be performed with this format.  Please refer to the patient's chart for his consent to telehealth for [facility].  Evaluation Performed:  Preoperative cardiovascular risk assessment _____________   Date:  11/13/2022   Patient ID:  Kevin Moore, DOB 1951/02/20, MRN [id number] Patient Location:  Home Provider location:   Office  Primary Care Provider:  Allegra Grana, FNP Primary Cardiologist:  Julien Nordmann, MD  Chief Complaint / Patient Profile   72 y.o. y/o male with a h/o coronary artery disease, hyperlipidemia, hypertension who is pending colonoscopy and presents today for telephonic preoperative cardiovascular risk assessment.  History of Present Illness    Kevin Moore is a 72 y.o. male who presents via audio/video conferencing for a telehealth visit today.  Pt was last seen in cardiology clinic on 08/08/2022 by Dr. Mariah Milling.  At that time Kevin Moore was doing well .  The patient is now pending procedure as outlined above. Since his last visit, he continues to be stable from a cardiac standpoint.  Today he denies chest pain, shortness of breath, lower extremity edema, fatigue, palpitations, melena, hematuria, hemoptysis, diaphoresis, weakness, presyncope, syncope, orthopnea, and PND.   Past Medical History    Past Medical History:  Diagnosis Date   Acute ST elevation myocardial infarction (STEMI) of inferior wall (HCC) 10/17/2014   a.) LHC 10/17/2014: <50% LAD,  50-60% prox mid diag, 50% pLCx, 99% mid major marg, 80-90% prox 2/3s RCA - during cath, spontaneous occ of subtotal LCx caused VF (defib 200J x 2). PCI of LCx (2.5 x 12 mm Xience Alpine). Started on amio gtt (bradycardia). Started on dopamine (SOB - d/c'd). Post-cath, dev WCT/polymorphic VT. EP consulted and lidocaine gtt started; b.) PCI of RCA (DES x2) on 10/20/2014   Beta-blocker intolerance    a.) causes profound bradycardia   BPH without urinary obstruction 03/2022   Coronary artery disease    a.) LHC 10/17/2014: <50% LAD, 50-60% prox portion of mid diagonal, 50% pLCx, 99% mid major marginal, 80-90% proximal 2/3s RCA  --> PCI performed placing a 2.5 x 12 mm Xience Alpine DES x 1 mLCx.; b.) PCI 10/20/2014: 80-90% o-mRCA --> 3.0 x 32 mm (proximal) and 2.5 x 38 mm (distal) Synergy DES placed.   GERD (gastroesophageal reflux disease)    History of colonic polyps    HLD (hyperlipidemia)    Hypertension    Long term current use of antithrombotics/antiplatelets    a.) clopidogrel   Melanoma (HCC)    Osteoarthritis    Polymorphic ventricular tachycardia (HCC) 10/17/2014   a.) post cardiac catheterization --> developed WCT/polymorphic VT. EP consulted and lidocaine gtt started --> felt to be secondary to known RCA disease. PCI of RCA recommended; no AICD deemed necessary at that time.   Prostatitis    Ventricular fibrillation (HCC) 10/17/2014   a.) in setting of STEMI 10/17/2014 --> during cath, spontaneous occlusion of subtotal LCx caused VF requring defib 200J x 2. PCI of LCx (2.5 x 12 mm Xience Alpine DES). Started on amiodarone gtt,  which caused bradycardia. Started on dopamine, which caused SOB (d/c'd).   Past Surgical History:  Procedure Laterality Date   COLON SURGERY     partial removal of colon with perforation due to suspected fish bone.   CORONARY ANGIOPLASTY WITH STENT PLACEMENT  10/17/2014   Procedure: CORONARY ANGIOPLASTY WITH STENT PLACEMENT (2.5 x 12 mm Xience Alpine to [employer]);  Location: [facility]   CORONARY STENT INTERVENTION Left 10/20/2014   Procedure: CORONARY STENT INTERVENTION (3.0 x 32 mm Synergy DES pRCA, 2.5 x 38 mm Synergy DES dRCA); Location: [facility]   CYSTOSCOPY WITH INSERTION OF UROLIFT N/A 05/09/2022   Procedure: CYSTOSCOPY WITH INSERTION OF UROLIFT;  Surgeon: Riki Altes, MD;  Location: [facility];  Service: Urology;  Laterality: N/A;   ESOPHAGEAL DILATION     KNEE ARTHROSCOPY Left    TONSILLECTOMY     TOTAL KNEE ARTHROPLASTY Left 03/28/2022   Procedure: TOTAL KNEE ARTHROPLASTY;  Surgeon: Christena Flake, MD;  Location: [facility];  Service: Orthopedics;  Laterality: Left;    Allergies  Allergies  Allergen Reactions   Beta Adrenergic Blockers Other (See Comments)    Heart rate drops into the 20s if he takes beta blockers.    Home Medications    Prior to Admission medications   Medication Sig Start Date End Date Taking? Authorizing Provider  Cholecalciferol 25 MCG (1000 UT) capsule Take 1,000 Units by mouth daily.    [provider]  clopidogrel (PLAVIX) 75 MG tablet Take 1 tablet (75 mg total) by mouth daily. 08/08/22   Antonieta Iba, MD  ezetimibe (ZETIA) 10 MG tablet Take 1 tablet (10 mg total) by mouth daily. 08/08/22   Antonieta Iba, MD  Magnesium 250 MG TABS Take 1 tablet by mouth daily.    [provider]  omeprazole (PRILOSEC) 20 MG capsule TAKE 1 CAPSULE BY MOUTH DAILY 08/25/22   Antonieta Iba, MD  rosuvastatin (CRESTOR) 40 MG tablet Take 1 tablet (40 mg total) by mouth daily. 08/08/22   Antonieta Iba, MD  telmisartan-hydrochlorothiazide (MICARDIS HCT) 40-12.5 MG tablet Take 1 tablet by mouth daily. 08/08/22   Antonieta Iba, MD    Physical Exam    Vital Signs:  Kevin Moore does not have vital signs available for review today.  Given telephonic nature of communication, physical exam is limited. AAOx3. NAD. Normal affect.  Speech and respirations are unlabored.  Accessory Clinical Findings     None  Assessment & Plan    1.  Preoperative Cardiovascular Risk Assessment: Colonoscopy, [facility], fax #[phone]      Primary Cardiologist: Julien Nordmann, MD  Chart reviewed as part of pre-operative protocol coverage. Given past medical history and time since last visit, based on ACC/AHA guidelines, Kevin Moore would be at acceptable risk for the planned procedure without further cardiovascular testing.   His Plavix may be held for 5 days prior to his procedure.  His aspirin may be held for 7 days prior to his procedure.  Please resume aspirin and Plavix as soon as hemostasis is achieved.  Patient was advised that if he develops new symptoms prior to surgery to contact our office to arrange a follow-up appointment.  He verbalized understanding.  I will route this recommendation to the requesting party via Epic fax function and remove from pre-op pool.  Please call with questions.       Time:   Today, I have spent  5 minutes with the patient with telehealth technology discussing medical  history, symptoms, and management plan.  Prior to his phone evaluation I spent greater than 10 minutes reviewing his past medical history and cardiac medications.   Kevin Asters, NP  11/13/2022, 7:46 AM

## 2022-11-23 ENCOUNTER — Telehealth: Payer: Self-pay

## 2022-11-23 NOTE — Telephone Encounter (Signed)
Chart reviewed patient completed his preop visit with Cardiology on 11/13/22.  Blood thinner advice provided and cardiac clearance was granted during his visit.  Per chart note 11/13/22  1.  Preoperative Cardiovascular Risk Assessment: Colonoscopy, [facility], fax #[phone]         Primary Cardiologist: Julien Nordmann, MD   Chart reviewed as part of pre-operative protocol coverage. Given past medical history and time since last visit, based on ACC/AHA guidelines, Kiva Lueken Majkowski would be at acceptable risk for the planned procedure without further cardiovascular testing.    His Plavix may be held for 5 days prior to his procedure.  His aspirin may be held for 7 days prior to his procedure.  Please resume aspirin and Plavix as soon as hemostasis is achieved.   Patient was advised that if he develops new symptoms prior to surgery to contact our office to arrange a follow-up appointment.  He verbalized understanding.   I will route this recommendation to the requesting party via Epic fax function and remove from pre-op pool.

## 2022-11-28 ENCOUNTER — Encounter: Payer: Self-pay | Admitting: Gastroenterology

## 2022-12-05 ENCOUNTER — Ambulatory Visit
Admission: RE | Admit: 2022-12-05 | Discharge: 2022-12-05 | Disposition: A | Discharge status: Home / Self Care | Payer: PPO | Attending: Gastroenterology | Admitting: Gastroenterology

## 2022-12-05 ENCOUNTER — Encounter: Payer: Self-pay | Admitting: Gastroenterology

## 2022-12-05 ENCOUNTER — Encounter
Admission: RE | Disposition: A | Discharge status: Home / Self Care | Payer: Self-pay | Source: Home / Self Care | Attending: Gastroenterology

## 2022-12-05 ENCOUNTER — Ambulatory Visit: Payer: PPO | Admitting: Certified Registered"

## 2022-12-05 ENCOUNTER — Other Ambulatory Visit: Payer: Self-pay

## 2022-12-05 DIAGNOSIS — Z955 Presence of coronary angioplasty implant and graft: Secondary | ICD-10-CM | POA: Diagnosis not present

## 2022-12-05 DIAGNOSIS — I252 Old myocardial infarction: Secondary | ICD-10-CM | POA: Diagnosis not present

## 2022-12-05 DIAGNOSIS — K5730 Diverticulosis of large intestine without perforation or abscess without bleeding: Secondary | ICD-10-CM | POA: Diagnosis not present

## 2022-12-05 DIAGNOSIS — I10 Essential (primary) hypertension: Secondary | ICD-10-CM | POA: Diagnosis not present

## 2022-12-05 DIAGNOSIS — D123 Benign neoplasm of transverse colon: Secondary | ICD-10-CM | POA: Insufficient documentation

## 2022-12-05 DIAGNOSIS — D122 Benign neoplasm of ascending colon: Secondary | ICD-10-CM | POA: Insufficient documentation

## 2022-12-05 DIAGNOSIS — K219 Gastro-esophageal reflux disease without esophagitis: Secondary | ICD-10-CM | POA: Diagnosis not present

## 2022-12-05 DIAGNOSIS — K648 Other hemorrhoids: Secondary | ICD-10-CM | POA: Diagnosis not present

## 2022-12-05 DIAGNOSIS — I2510 Atherosclerotic heart disease of native coronary artery without angina pectoris: Secondary | ICD-10-CM | POA: Diagnosis not present

## 2022-12-05 DIAGNOSIS — Z96652 Presence of left artificial knee joint: Secondary | ICD-10-CM | POA: Diagnosis not present

## 2022-12-05 DIAGNOSIS — K635 Polyp of colon: Secondary | ICD-10-CM | POA: Diagnosis not present

## 2022-12-05 DIAGNOSIS — D124 Benign neoplasm of descending colon: Secondary | ICD-10-CM | POA: Insufficient documentation

## 2022-12-05 DIAGNOSIS — Z7902 Long term (current) use of antithrombotics/antiplatelets: Secondary | ICD-10-CM | POA: Insufficient documentation

## 2022-12-05 DIAGNOSIS — Z1211 Encounter for screening for malignant neoplasm of colon: Secondary | ICD-10-CM | POA: Diagnosis not present

## 2022-12-05 DIAGNOSIS — K640 First degree hemorrhoids: Secondary | ICD-10-CM | POA: Diagnosis not present

## 2022-12-05 HISTORY — PX: COLONOSCOPY WITH PROPOFOL: SHX5780

## 2022-12-05 HISTORY — PX: POLYPECTOMY: SHX5525

## 2022-12-05 SURGERY — COLONOSCOPY WITH PROPOFOL
Anesthesia: General

## 2022-12-05 MED ORDER — SODIUM CHLORIDE 0.9 % IV SOLN: INTRAVENOUS | Status: DC

## 2022-12-05 MED ORDER — LIDOCAINE HCL (CARDIAC) PF 100 MG/5ML IV SOSY
PREFILLED_SYRINGE | INTRAVENOUS | Status: DC | PRN
  Administered 2022-12-05: 100 mg via INTRAVENOUS

## 2022-12-05 MED ORDER — PROPOFOL 10 MG/ML IV BOLUS
INTRAVENOUS | Status: DC | PRN
  Administered 2022-12-05: 150 ug/kg/min via INTRAVENOUS
  Administered 2022-12-05: 50 mg via INTRAVENOUS

## 2022-12-05 NOTE — Anesthesia Preprocedure Evaluation (Signed)
Anesthesia Evaluation  Patient identified by MRN, date of birth, ID band Patient awake    Reviewed: Allergy & Precautions, NPO status , Patient's Chart, lab work & pertinent test results  History of Anesthesia Complications Negative for: history of anesthetic complications  Airway Mallampati: III   Neck ROM: Full    Dental no notable dental hx.    Pulmonary neg pulmonary ROS   Pulmonary exam normal breath sounds clear to auscultation       Cardiovascular hypertension, (-) angina + CAD (s/p MI and stents on Plavix), + Past MI and + Cardiac Stents  Normal cardiovascular exam(-) dysrhythmias (-) Valvular Problems/Murmurs Rhythm:Regular Rate:Normal  ECG 03/15/22: SB with short PR; unchanged TWI   Neuro/Psych negative neurological ROS     GI/Hepatic ,GERD  ,,  Endo/Other  negative endocrine ROS    Renal/GU negative Renal ROS   BPH    Musculoskeletal  (+) Arthritis ,  Melanoma    Abdominal   Peds  Hematology negative hematology ROS (+)   Anesthesia Other Findings Reviewed and agree with Kevin Moore pre-anesthesia clinical review note.   Cardiology note 03/21/22 (prior to TKA):  1.  Preoperative Cardiovascular Risk Assessment:   The patient was advised that if he develops new symptoms prior to surgery to contact our office to arrange for a follow-up visit, and he verbalized understanding.    According to the Revised Cardiac Risk Index (RCRI), his Perioperative Risk of Major Cardiac Event is (%): 0.9   His Functional Capacity in METs is: 8.97 according to the Duke Activity Status Index (DASI).   Therefore, based on ACC/AHA guidelines, patient would be at acceptable risk for the planned procedure without further cardiovascular testing.    Recommendations: According to ACC/AHA guidelines, no further cardiovascular testing needed.  The patient may proceed to surgery at acceptable risk.     Cardiology note 04/25/22:   Primary Cardiologist: Kevin Nordmann, MD   Chart reviewed as part of pre-operative protocol coverage. Given past medical history and time since last visit, based on ACC/AHA guidelines, Kevin Moore would be at acceptable risk for the planned procedure without further cardiovascular testing.    I contacted the patient by phone since he was recently cleared for knee surgery. He reports he has been doing well with no concerning cardiac symptoms. He resumed Plavix after taking a course of Eliquis as prescribed by orthopedic surgeon. He was advised that if he develops new symptoms prior to surgery to contact our office to arrange a follow-up appointment. He verbalized understanding.   Per office protocol, he may hold Plavix for 5 days prior to procedure and should resume as soon as hemodynamically stable postoperatively.     I will route this recommendation to the requesting party via Epic fax function and remove from pre-op pool.   Please call with questions.   Kevin Aland, NP-C   Reproductive/Obstetrics                             Anesthesia Physical Anesthesia Plan  ASA: 3  Anesthesia Plan: General   Post-op Pain Management:    Induction: Intravenous  PONV Risk Score and Plan: 2 and Propofol infusion, TIVA and Treatment may vary due to age or medical condition  Airway Management Planned: Natural Airway and Nasal Cannula  Additional Equipment:   Intra-op Plan:   Post-operative Plan:   Informed Consent: I have reviewed the patients History and Physical, chart, labs  and discussed the procedure including the risks, benefits and alternatives for the proposed anesthesia with the patient or authorized representative who has indicated his/her understanding and acceptance.       Plan Discussed with: CRNA  Anesthesia Plan Comments:         Anesthesia Quick Evaluation

## 2022-12-05 NOTE — Transfer of Care (Signed)
Immediate Anesthesia Transfer of Care Note  Patient: Kevin Moore  Procedure(s) Performed: COLONOSCOPY WITH PROPOFOL POLYPECTOMY  Patient Location: PACU  Anesthesia Type:General  Level of Consciousness: awake, alert , and oriented  Airway & Oxygen Therapy: Patient Spontanous Breathing  Post-op Assessment: Report given to RN and Post -op Vital signs reviewed and stable  Post vital signs: stable  Last Vitals:  Vitals Value Taken Time  BP 116/71 12/05/22 0927  Temp 36.3 C 12/05/22 0927  Pulse 47 12/05/22 0929  Resp 14 12/05/22 0929  SpO2 99 % 12/05/22 0929  Vitals shown include unfiled device data.  Last Pain:  Vitals:   12/05/22 0927  TempSrc: Temporal  PainSc: 0-No pain         Complications: No notable events documented.

## 2022-12-05 NOTE — H&P (Signed)
[name], MD [facility] [address], [address] [place], [place] [address] Phone: [phone] Fax : [phone]  Primary Care Physician:  Allegra Grana, FNP Primary Gastroenterologist:  Dr. Servando Snare  Pre-Procedure History & Physical: HPI:  Kevin Moore is a 72 y.o. male is here for a screening colonoscopy.   Past Medical History:  Diagnosis Date   Acute ST elevation myocardial infarction (STEMI) of inferior wall (HCC) 10/17/2014   a.) LHC 10/17/2014: <50% LAD, 50-60% prox mid diag, 50% pLCx, 99% mid major marg, 80-90% prox 2/3s RCA - during cath, spontaneous occ of subtotal LCx caused VF (defib 200J x 2). PCI of LCx (2.5 x 12 mm Xience Alpine). Started on amio gtt (bradycardia). Started on dopamine (SOB - d/c'd). Post-cath, dev WCT/polymorphic VT. EP consulted and lidocaine gtt started; b.) PCI of RCA (DES x2) on 10/20/2014   Beta-blocker intolerance    a.) causes profound bradycardia   BPH without urinary obstruction 03/2022   Coronary artery disease    a.) LHC 10/17/2014: <50% LAD, 50-60% prox portion of mid diagonal, 50% pLCx, 99% mid major marginal, 80-90% proximal 2/3s RCA  --> PCI performed placing a 2.5 x 12 mm Xience Alpine DES x 1 mLCx.; b.) PCI 10/20/2014: 80-90% o-mRCA --> 3.0 x 32 mm (proximal) and 2.5 x 38 mm (distal) Synergy DES placed.   GERD (gastroesophageal reflux disease)    History of colonic polyps    HLD (hyperlipidemia)    Hypertension    Long term current use of antithrombotics/antiplatelets    a.) clopidogrel   Melanoma (HCC)    Osteoarthritis    Polymorphic ventricular tachycardia (HCC) 10/17/2014   a.) post cardiac catheterization --> developed WCT/polymorphic VT. EP consulted and lidocaine gtt started --> felt to be secondary to known RCA disease. PCI of RCA recommended; no AICD deemed necessary at that time.   Prostatitis    Ventricular fibrillation (HCC) 10/17/2014   a.) in setting of STEMI 10/17/2014 --> during cath, spontaneous occlusion of  subtotal LCx caused VF requring defib 200J x 2. PCI of LCx (2.5 x 12 mm Xience Alpine DES). Started on amiodarone gtt, which caused bradycardia. Started on dopamine, which caused SOB (d/c'd).    Past Surgical History:  Procedure Laterality Date   COLON SURGERY     partial removal of colon with perforation due to suspected fish bone.   CORONARY ANGIOPLASTY WITH STENT PLACEMENT  10/17/2014   Procedure: CORONARY ANGIOPLASTY WITH STENT PLACEMENT (2.5 x 12 mm Xience Alpine to [employer]); Location: [facility]   CORONARY STENT INTERVENTION Left 10/20/2014   Procedure: CORONARY STENT INTERVENTION (3.0 x 32 mm Synergy DES pRCA, 2.5 x 38 mm Synergy DES dRCA); Location: [facility]   CYSTOSCOPY WITH INSERTION OF UROLIFT N/A 05/09/2022   Procedure: CYSTOSCOPY WITH INSERTION OF UROLIFT;  Surgeon: Riki Altes, MD;  Location: [facility];  Service: Urology;  Laterality: N/A;   ESOPHAGEAL DILATION     KNEE ARTHROSCOPY Left    TONSILLECTOMY     TOTAL KNEE ARTHROPLASTY Left 03/28/2022   Procedure: TOTAL KNEE ARTHROPLASTY;  Surgeon: Christena Flake, MD;  Location: [facility];  Service: Orthopedics;  Laterality: Left;    Prior to Admission medications   Medication Sig Start Date End Date Taking? Authorizing Provider  Cholecalciferol 25 MCG (1000 UT) capsule Take 1,000 Units by mouth daily.   Yes [provider]  ezetimibe (ZETIA) 10 MG tablet Take 1 tablet (10 mg total) by mouth daily. 08/08/22  Yes Antonieta Iba, MD  Magnesium 250 MG TABS Take 1 tablet by mouth daily.   Yes [provider]  omeprazole (PRILOSEC) 20 MG capsule TAKE 1 CAPSULE BY MOUTH DAILY 08/25/22  Yes Gollan, Tollie Pizza, MD  rosuvastatin (CRESTOR) 40 MG tablet Take 1 tablet (40 mg total) by mouth daily. 08/08/22  Yes Gollan, Tollie Pizza, MD  telmisartan-hydrochlorothiazide (MICARDIS HCT) 40-12.5 MG tablet Take 1 tablet by mouth daily. 08/08/22  Yes Antonieta Iba, MD  clopidogrel (PLAVIX) 75 MG tablet Take 1 tablet (75 mg total) by mouth daily.  08/08/22   Antonieta Iba, MD    Allergies as of 10/20/2022 - Review Complete 10/17/2022  Allergen Reaction Noted   Beta adrenergic blockers Other (See Comments) 11/18/2021    Family History  Problem Relation Age of Onset   Hyperlipidemia Mother    Cancer Mother        Ovarian, died age 70   Heart disease Father        bypass at age 79   Protein C deficiency Brother    Epilepsy Son     Social History   Socioeconomic History   Marital status: Married    Spouse name: RHONDA   Number of children: 2   Years of education: Not on file   Highest education level: Not on file  Occupational History   Not on file  Tobacco Use   Smoking status: Never    Passive exposure: Never   Smokeless tobacco: Never  Vaping Use   Vaping status: Never Used  Substance and Sexual Activity   Alcohol use: Yes    Alcohol/week: 3.0 standard drinks of alcohol    Types: 3 Cans of beer per week    Comment: occassional beer 1 day a week   Drug use: No   Sexual activity: Yes  Other Topics Concern   Not on file  Social History Narrative   Married to [place]. Still works full time in the Agricultural consultant. Stays active at work.   Social Determinants of Health   Financial Resource Strain: Low Risk  (11/01/2022)   Overall Financial Resource Strain (CARDIA)    Difficulty of Paying Living Expenses: Not hard at all  Food Insecurity: No Food Insecurity (11/01/2022)   Hunger Vital Sign    Worried About Running Out of Food in the Last Year: Never true    Ran Out of Food in the Last Year: Never true  Transportation Needs: No Transportation Needs (11/01/2022)   PRAPARE - Administrator, Civil Service (Medical): No    Lack of Transportation (Non-Medical): No  Physical Activity: Sufficiently Active (11/01/2022)   Exercise Vital Sign    Days of Exercise per Week: 5 days    Minutes of Exercise per Session: 30 min  Stress: No Stress Concern Present (11/01/2022)   [employer] of  Occupational Health - Occupational Stress Questionnaire    Feeling of Stress : Not at all  Social Connections: Moderately Integrated (11/01/2022)   Social Connection and Isolation Panel [NHANES]    Frequency of Communication with Friends and Family: More than three times a week    Frequency of Social Gatherings with Friends and Family: Twice a week    Attends Religious Services: More than 4 times per year    Active Member of [employer] or Organizations: No    Attends Banker Meetings: Never    Marital Status: Married  Catering manager Violence: Not At Risk (11/01/2022)   Humiliation, Afraid, Rape, and Kick questionnaire  Fear of Current or Ex-Partner: No    Emotionally Abused: No    Physically Abused: No    Sexually Abused: No    Review of Systems: See HPI, otherwise negative ROS  Physical Exam: BP (!) 197/94   Pulse (!) 54   Temp 97.7 F (36.5 C) (Temporal)   Resp 18   Ht 6\' 2"  (1.88 m)   Wt 98.9 kg   SpO2 96%   BMI 27.99 kg/m  General:   Alert,  pleasant and cooperative in NAD Head:  Normocephalic and atraumatic. Neck:  Supple; no masses or thyromegaly. Lungs:  Clear throughout to auscultation.    Heart:  Regular rate and rhythm. Abdomen:  Soft, nontender and nondistended. Normal bowel sounds, without guarding, and without rebound.   Neurologic:  Alert and  oriented x4;  grossly normal neurologically.  Impression/Plan: Currie Hedrick Amores is now here to undergo a screening colonoscopy.  Risks, benefits, and alternatives regarding colonoscopy have been reviewed with the patient.  Questions have been answered.  All parties agreeable.

## 2022-12-05 NOTE — Op Note (Signed)
[facility] Gastroenterology Patient Name: Kevin Moore Procedure Date: 12/05/2022 8:55 AM MRN: [id number] Account #: [id number] Date of Birth: [birth year] Admit Type: Outpatient Age: 72 Room: [facility] ENDO ROOM 4 Gender: Male Note Status: Finalized Instrument Name: Prentice Docker [id number] Procedure:             Colonoscopy Indications:           Screening for colorectal malignant neoplasm Providers:             [name] MD, MD Referring MD:          Lyn Records. Arnett (Referring MD) Medicines:             Propofol per Anesthesia Complications:         No immediate complications. Procedure:             Pre-Anesthesia Assessment:                        - Prior to the procedure, a History and Physical was                         performed, and patient medications and allergies were                         reviewed. The patient's tolerance of previous                         anesthesia was also reviewed. The risks and benefits                         of the procedure and the sedation options and risks                         were discussed with the patient. All questions were                         answered, and informed consent was obtained. Prior                         Anticoagulants: The patient has taken no anticoagulant                         or antiplatelet agents. ASA Grade Assessment: II - A                         patient with mild systemic disease. After reviewing                         the risks and benefits, the patient was deemed in                         satisfactory condition to undergo the procedure.                        After obtaining informed consent, the colonoscope was                         passed under direct vision. Throughout the procedure,  the patient's blood pressure, pulse, and oxygen                         saturations were monitored continuously. The                         Colonoscope was introduced  through the anus and                         advanced to the the cecum, identified by appendiceal                         orifice and ileocecal valve. The colonoscopy was                         performed without difficulty. The patient tolerated                         the procedure well. The quality of the bowel                         preparation was excellent. Findings:      The perianal and digital rectal examinations were normal.      Four sessile polyps were found in the ascending colon. The polyps were 3       to 6 mm in size. These polyps were removed with a cold snare. Resection       and retrieval were complete.      Three sessile polyps were found in the transverse colon. The polyps were       2 to 4 mm in size. These polyps were removed with a cold snare.       Resection and retrieval were complete.      A 3 mm polyp was found in the descending colon. The polyp was sessile.       The polyp was removed with a cold snare. Resection and retrieval were       complete.      A few small-mouthed diverticula were found in the descending colon.      Non-bleeding internal hemorrhoids were found during retroflexion. The       hemorrhoids were Grade I (internal hemorrhoids that do not prolapse).      There was evidence of a prior end-to-end colo-colonic anastomosis in the       sigmoid colon. This was patent. Impression:            - Four 3 to 6 mm polyps in the ascending colon,                         removed with a cold snare. Resected and retrieved.                        - Three 2 to 4 mm polyps in the transverse colon,                         removed with a cold snare. Resected and retrieved.                        - One 3 mm polyp in the descending colon, removed with  a cold snare. Resected and retrieved.                        - Diverticulosis in the descending colon.                        - Non-bleeding internal hemorrhoids.                        -  Patent end-to-end colo-colonic anastomosis. Recommendation:        - Discharge patient to home.                        - Resume previous diet.                        - Continue present medications.                        - Await pathology results.                        - If the pathology report reveals adenomatous tissue,                         then repeat the colonoscopy for surveillance in 3                         years. Procedure Code(s):     --- Professional ---                        [phone], Colonoscopy, flexible; with removal of                         tumor(s), polyp(s), or other lesion(s) by snare                         technique Diagnosis Code(s):     --- Professional ---                        Z12.11, Encounter for screening for malignant neoplasm                         of colon                        D12.3, Benign neoplasm of transverse colon (hepatic                         flexure or splenic flexure) CPT copyright 2022 American Medical Association. All rights reserved. The codes documented in this report are preliminary and upon coder review may  be revised to meet current compliance requirements. [name] MD, MD 12/05/2022 9:27:44 AM This report has been signed electronically. Number of Addenda: 0 Note Initiated On: 12/05/2022 8:55 AM Scope Withdrawal Time: 0 hours 13 minutes 43 seconds  Total Procedure Duration: 0 hours 15 minutes 31 seconds  Estimated Blood Loss:  Estimated blood loss: none.      [facility]

## 2022-12-06 ENCOUNTER — Encounter: Payer: Self-pay | Admitting: Gastroenterology

## 2022-12-06 LAB — SURGICAL PATHOLOGY

## 2022-12-07 ENCOUNTER — Encounter: Payer: Self-pay | Admitting: Gastroenterology

## 2022-12-13 ENCOUNTER — Ambulatory Visit: Payer: PPO | Admitting: Urology

## 2022-12-13 ENCOUNTER — Encounter: Payer: Self-pay | Admitting: Urology

## 2022-12-13 VITALS — BP 151/94 | HR 83 | Ht 74.0 in | Wt 218.0 lb

## 2022-12-13 DIAGNOSIS — R972 Elevated prostate specific antigen [PSA]: Secondary | ICD-10-CM | POA: Diagnosis not present

## 2022-12-13 DIAGNOSIS — N138 Other obstructive and reflux uropathy: Secondary | ICD-10-CM

## 2022-12-13 DIAGNOSIS — N401 Enlarged prostate with lower urinary tract symptoms: Secondary | ICD-10-CM

## 2022-12-13 LAB — BLADDER SCAN AMB NON-IMAGING

## 2022-12-13 NOTE — Progress Notes (Signed)
I, [name], acting as a Neurosurgeon for Kevin Altes, MD., have documented all relevant documentation on the behalf of Kevin Altes, MD, as directed by  Kevin Altes, MD while in the presence of Kevin Altes, MD.  12/13/2022 5:19 PM   Leeroy Bock Moore 05/04/1950 [id number]  Referring provider: Allegra Grana, FNP [address] [place],  [place] [address]  Chief Complaint  Patient presents with   Follow-up    HPI: 72 y.o. male presents for 6 month follow-up  History of BPH with incomplete bladder emptying; subsequently developed symptomatic urinary retention after total knee replacement and failed several voiding trials. Status post urolift 05/09/22. Continues to void with an excellent stream and has no bothersome LUTS. No longer taking an alpha blocker.   PMH: Past Medical History:  Diagnosis Date   Acute ST elevation myocardial infarction (STEMI) of inferior wall (HCC) 10/17/2014   a.) LHC 10/17/2014: <50% LAD, 50-60% prox mid diag, 50% pLCx, 99% mid major marg, 80-90% prox 2/3s RCA - during cath, spontaneous occ of subtotal LCx caused VF (defib 200J x 2). PCI of LCx (2.5 x 12 mm Xience Alpine). Started on amio gtt (bradycardia). Started on dopamine (SOB - d/c'd). Post-cath, dev WCT/polymorphic VT. EP consulted and lidocaine gtt started; b.) PCI of RCA (DES x2) on 10/20/2014   Beta-blocker intolerance    a.) causes profound bradycardia   BPH without urinary obstruction 03/2022   Coronary artery disease    a.) LHC 10/17/2014: <50% LAD, 50-60% prox portion of mid diagonal, 50% pLCx, 99% mid major marginal, 80-90% proximal 2/3s RCA  --> PCI performed placing a 2.5 x 12 mm Xience Alpine DES x 1 mLCx.; b.) PCI 10/20/2014: 80-90% o-mRCA --> 3.0 x 32 mm (proximal) and 2.5 x 38 mm (distal) Synergy DES placed.   GERD (gastroesophageal reflux disease)    History of colonic polyps    HLD (hyperlipidemia)    Hypertension    Long term current use of  antithrombotics/antiplatelets    a.) clopidogrel   Melanoma (HCC)    Osteoarthritis    Polymorphic ventricular tachycardia (HCC) 10/17/2014   a.) post cardiac catheterization --> developed WCT/polymorphic VT. EP consulted and lidocaine gtt started --> felt to be secondary to known RCA disease. PCI of RCA recommended; no AICD deemed necessary at that time.   Prostatitis    Ventricular fibrillation (HCC) 10/17/2014   a.) in setting of STEMI 10/17/2014 --> during cath, spontaneous occlusion of subtotal LCx caused VF requring defib 200J x 2. PCI of LCx (2.5 x 12 mm Xience Alpine DES). Started on amiodarone gtt, which caused bradycardia. Started on dopamine, which caused SOB (d/c'd).    Surgical History: Past Surgical History:  Procedure Laterality Date   COLON SURGERY     partial removal of colon with perforation due to suspected fish bone.   COLONOSCOPY WITH PROPOFOL N/A 12/05/2022   Procedure: COLONOSCOPY WITH PROPOFOL;  Surgeon: [name], MD;  Location: [facility] ENDOSCOPY;  Service: Endoscopy;  Laterality: N/A;   CORONARY ANGIOPLASTY WITH STENT PLACEMENT  10/17/2014   Procedure: CORONARY ANGIOPLASTY WITH STENT PLACEMENT (2.5 x 12 mm Xience Alpine to [employer]); Location: [facility]   CORONARY STENT INTERVENTION Left 10/20/2014   Procedure: CORONARY STENT INTERVENTION (3.0 x 32 mm Synergy DES pRCA, 2.5 x 38 mm Synergy DES dRCA); Location: [facility]   CYSTOSCOPY WITH INSERTION OF UROLIFT N/A 05/09/2022   Procedure: CYSTOSCOPY WITH INSERTION OF UROLIFT;  Surgeon: Kevin Altes, MD;  Location: [facility] ORS;  Service: Urology;  Laterality: N/A;   ESOPHAGEAL DILATION     KNEE ARTHROSCOPY Left    POLYPECTOMY  12/05/2022   Procedure: POLYPECTOMY;  Surgeon: [name], MD;  Location: [facility] ENDOSCOPY;  Service: Endoscopy;;   TONSILLECTOMY     TOTAL KNEE ARTHROPLASTY Left 03/28/2022   Procedure: TOTAL KNEE ARTHROPLASTY;  Surgeon: Christena Flake, MD;  Location: [facility] ORS;  Service: Orthopedics;  Laterality: Left;    Home  Medications:  Allergies as of 12/13/2022       Reactions   Beta Adrenergic Blockers Other (See Comments)   Heart rate drops into the 20s if he takes beta blockers.        Medication List        Accurate as of December 13, 2022  5:19 PM. If you have any questions, ask your nurse or doctor.          Cholecalciferol 25 MCG (1000 UT) capsule Take 1,000 Units by mouth daily.   clopidogrel 75 MG tablet Commonly known as: PLAVIX Take 1 tablet (75 mg total) by mouth daily.   ezetimibe 10 MG tablet Commonly known as: ZETIA Take 1 tablet (10 mg total) by mouth daily.   Magnesium 250 MG Tabs Take 1 tablet by mouth daily.   omeprazole 20 MG capsule Commonly known as: PRILOSEC TAKE 1 CAPSULE BY MOUTH DAILY   rosuvastatin 40 MG tablet Commonly known as: CRESTOR Take 1 tablet (40 mg total) by mouth daily.   telmisartan-hydrochlorothiazide 40-12.5 MG tablet Commonly known as: MICARDIS HCT Take 1 tablet by mouth daily.        Allergies:  Allergies  Allergen Reactions   Beta Adrenergic Blockers Other (See Comments)    Heart rate drops into the 20s if he takes beta blockers.    Family History: Family History  Problem Relation Age of Onset   Hyperlipidemia Mother    Cancer Mother        Ovarian, died age 57   Heart disease Father        bypass at age 45   Protein C deficiency Brother    Epilepsy Son     Social History:  reports that he has never smoked. He has never been exposed to tobacco smoke. He has never used smokeless tobacco. He reports current alcohol use of about 3.0 standard drinks of alcohol per week. He reports that he does not use drugs.   Physical Exam: BP (!) 151/94 (BP Location: Left Arm, Patient Position: Sitting, Cuff Size: Large)   Pulse 83   Ht 6\' 2"  (1.88 m)   Wt 218 lb (98.9 kg)   BMI 27.99 kg/m   Constitutional:  Alert, No acute distress. Psychiatric: Normal mood and affect.   Assessment & Plan:    1. Benign prostatic hyperplasia  with urinary obstruction Continues to do well status post UroLift and is symptom free. PVR 248 mL which is around his baseline. 6 month follow-up with PVR/IPSS PSA due next month and he states he is scheduled for blood work with PCP   I have reviewed the above documentation for accuracy and completeness, and I agree with the above.   Kevin Altes, MD  [facility] [address] [place], [place] [address] [phone]

## 2022-12-13 NOTE — Anesthesia Postprocedure Evaluation (Signed)
Anesthesia Post Note  Patient: Kevin Moore  Procedure(s) Performed: COLONOSCOPY WITH PROPOFOL POLYPECTOMY  Patient location during evaluation: Endoscopy Anesthesia Type: General Level of consciousness: awake and alert Pain management: pain level controlled Vital Signs Assessment: post-procedure vital signs reviewed and stable Respiratory status: spontaneous breathing, nonlabored ventilation, respiratory function stable and patient connected to nasal cannula oxygen Cardiovascular status: blood pressure returned to baseline and stable Postop Assessment: no apparent nausea or vomiting Anesthetic complications: no   There were no known notable events for this encounter.   Last Vitals:  Vitals:   12/05/22 0937 12/05/22 0947  BP: 139/76 (!) 147/77  Pulse:  (!) 40  Resp:    Temp:    SpO2: 100%     Last Pain:  Vitals:   12/06/22 0731  TempSrc:   PainSc: 1                  Lenard Simmer

## 2022-12-14 ENCOUNTER — Encounter: Payer: Self-pay | Admitting: Urology

## 2022-12-19 ENCOUNTER — Other Ambulatory Visit (INDEPENDENT_AMBULATORY_CARE_PROVIDER_SITE_OTHER): Payer: PPO

## 2022-12-19 DIAGNOSIS — I25118 Atherosclerotic heart disease of native coronary artery with other forms of angina pectoris: Secondary | ICD-10-CM | POA: Diagnosis not present

## 2022-12-19 DIAGNOSIS — L570 Actinic keratosis: Secondary | ICD-10-CM | POA: Diagnosis not present

## 2022-12-19 DIAGNOSIS — D2262 Melanocytic nevi of left upper limb, including shoulder: Secondary | ICD-10-CM | POA: Diagnosis not present

## 2022-12-19 DIAGNOSIS — Z8639 Personal history of other endocrine, nutritional and metabolic disease: Secondary | ICD-10-CM

## 2022-12-19 DIAGNOSIS — D2272 Melanocytic nevi of left lower limb, including hip: Secondary | ICD-10-CM | POA: Diagnosis not present

## 2022-12-19 DIAGNOSIS — Z872 Personal history of diseases of the skin and subcutaneous tissue: Secondary | ICD-10-CM | POA: Diagnosis not present

## 2022-12-19 DIAGNOSIS — D225 Melanocytic nevi of trunk: Secondary | ICD-10-CM | POA: Diagnosis not present

## 2022-12-19 DIAGNOSIS — D2261 Melanocytic nevi of right upper limb, including shoulder: Secondary | ICD-10-CM | POA: Diagnosis not present

## 2022-12-19 DIAGNOSIS — D0462 Carcinoma in situ of skin of left upper limb, including shoulder: Secondary | ICD-10-CM | POA: Diagnosis not present

## 2022-12-19 DIAGNOSIS — D2271 Melanocytic nevi of right lower limb, including hip: Secondary | ICD-10-CM | POA: Diagnosis not present

## 2022-12-19 DIAGNOSIS — Z85828 Personal history of other malignant neoplasm of skin: Secondary | ICD-10-CM | POA: Diagnosis not present

## 2022-12-19 DIAGNOSIS — D485 Neoplasm of uncertain behavior of skin: Secondary | ICD-10-CM | POA: Diagnosis not present

## 2022-12-19 DIAGNOSIS — C44519 Basal cell carcinoma of skin of other part of trunk: Secondary | ICD-10-CM | POA: Diagnosis not present

## 2022-12-19 DIAGNOSIS — X32XXXA Exposure to sunlight, initial encounter: Secondary | ICD-10-CM | POA: Diagnosis not present

## 2022-12-19 LAB — VITAMIN D 25 HYDROXY (VIT D DEFICIENCY, FRACTURES): VITD: 43.39 ng/mL (ref 30.00–100.00)

## 2022-12-19 LAB — LIPID PANEL
Cholesterol: 127 mg/dL (ref 0–200)
HDL: 43.5 mg/dL (ref >39.00)
LDL Cholesterol: 62 mg/dL (ref 0–99)
NonHDL: 83.31
Total CHOL/HDL Ratio: 3
Triglycerides: 109 mg/dL (ref 0.0–149.0)
VLDL: 21.8 mg/dL (ref 0.0–40.0)

## 2022-12-21 DIAGNOSIS — H25813 Combined forms of age-related cataract, bilateral: Secondary | ICD-10-CM | POA: Diagnosis not present

## 2023-02-06 ENCOUNTER — Other Ambulatory Visit: Payer: Self-pay | Admitting: Pharmacist

## 2023-02-06 NOTE — Progress Notes (Signed)
Pharmacy Quality Measure Review  This patient is appearing on a report for being at risk of failing the adherence measure for cholesterol (statin) medications this calendar year.   Medication: rosuvastatin 40 mg daily Last fill date: 10/06/22 for 90 day supply  Contacted pharmacy to facilitate refills. Discussed with patient, he denies questions or concerns and stated he would fill his pill box tonight and likely needs other refills. He will collaborate with pharmacy on this.   Catie Eppie Gibson, PharmD, BCACP, CPP Clinical Pharmacist [facility] [phone]

## 2023-03-28 HISTORY — PX: OTHER SURGICAL HISTORY: SHX169

## 2023-04-18 ENCOUNTER — Telehealth: Payer: Self-pay | Admitting: Family

## 2023-04-18 NOTE — Telephone Encounter (Signed)
Left the patient a voicemail asking him to call the office to reschedule his appointment due to the provider not being in the office. Please reschedule patient.

## 2023-04-18 NOTE — Telephone Encounter (Signed)
Pt appt canceled 04/19/23 Please call to reschedule

## 2023-04-19 ENCOUNTER — Ambulatory Visit: Payer: PPO | Admitting: Family

## 2023-04-19 NOTE — Telephone Encounter (Signed)
Lvm to call back to  reschedule Pt appt canceled 04/19/23 Please call to reschedule

## 2023-04-20 NOTE — Telephone Encounter (Signed)
Spoke to pt and he will call back to reschedule when he gets home in front of his calendar

## 2023-05-04 ENCOUNTER — Ambulatory Visit
Admission: EM | Admit: 2023-05-04 | Discharge: 2023-05-04 | Disposition: A | Discharge status: Home / Self Care | Payer: HMO | Attending: Emergency Medicine | Admitting: Emergency Medicine

## 2023-05-04 ENCOUNTER — Encounter: Payer: Self-pay | Admitting: Emergency Medicine

## 2023-05-04 DIAGNOSIS — R300 Dysuria: Secondary | ICD-10-CM | POA: Diagnosis not present

## 2023-05-04 LAB — POCT URINALYSIS DIP (MANUAL ENTRY)
Bilirubin, UA: NEGATIVE
Glucose, UA: NEGATIVE mg/dL
Ketones, POC UA: NEGATIVE mg/dL
Nitrite, UA: NEGATIVE
Protein Ur, POC: NEGATIVE mg/dL
Spec Grav, UA: 1.01 (ref 1.010–1.025)
Urobilinogen, UA: 0.2 U/dL
pH, UA: 6.5 (ref 5.0–8.0)

## 2023-05-04 MED ORDER — SULFAMETHOXAZOLE-TRIMETHOPRIM 800-160 MG PO TABS
1.0000 | ORAL_TABLET | Freq: Two times a day (BID) | ORAL | 0 refills | Status: AC
Start: 2023-05-04 — End: 2023-05-11

## 2023-05-04 NOTE — ED Provider Notes (Signed)
 Kevin Moore    CSN: [id number] Arrival date & time: 05/04/23  1319      History   Chief Complaint No chief complaint on file.   HPI Kevin Moore is a 73 y.o. male.  Patient presents with dysuria since this morning.  No difficulty starting or maintaining urine stream.  He denies fever, chills, hematuria, abdominal pain, flank pain, penile discharge.  No OTC medications today.  His medical history includes BPH.  The history is provided by the patient and medical records.    Past Medical History:  Diagnosis Date   Acute ST elevation myocardial infarction (STEMI) of inferior wall (HCC) 10/17/2014   a.) LHC 10/17/2014: <50% LAD, 50-60% prox mid diag, 50% pLCx, 99% mid major marg, 80-90% prox 2/3s RCA - during cath, spontaneous occ of subtotal LCx caused VF (defib 200J x 2). PCI of LCx (2.5 x 12 mm Xience Alpine). Started on amio gtt (bradycardia). Started on dopamine (SOB - d/c'd). Post-cath, dev WCT/polymorphic VT. EP consulted and lidocaine  gtt started; b.) PCI of RCA (DES x2) on 10/20/2014   Beta-blocker intolerance    a.) causes profound bradycardia   BPH without urinary obstruction 03/2022   Coronary artery disease    a.) LHC 10/17/2014: <50% LAD, 50-60% prox portion of mid diagonal, 50% pLCx, 99% mid major marginal, 80-90% proximal 2/3s RCA  --> PCI performed placing a 2.5 x 12 mm Xience Alpine DES x 1 mLCx.; b.) PCI 10/20/2014: 80-90% o-mRCA --> 3.0 x 32 mm (proximal) and 2.5 x 38 mm (distal) Synergy DES placed.   GERD (gastroesophageal reflux disease)    History of colonic polyps    HLD (hyperlipidemia)    Hypertension    Long term current use of antithrombotics/antiplatelets    a.) clopidogrel    Melanoma (HCC)    Osteoarthritis    Polymorphic ventricular tachycardia (HCC) 10/17/2014   a.) post cardiac catheterization --> developed WCT/polymorphic VT. EP consulted and lidocaine  gtt started --> felt to be secondary to known RCA disease. PCI of RCA recommended;  no AICD deemed necessary at that time.   Prostatitis    Ventricular fibrillation (HCC) 10/17/2014   a.) in setting of STEMI 10/17/2014 --> during cath, spontaneous occlusion of subtotal LCx caused VF requring defib 200J x 2. PCI of LCx (2.5 x 12 mm Xience Alpine DES). Started on amiodarone gtt, which caused bradycardia. Started on dopamine, which caused SOB (d/c'd).    Patient Active Problem List   Diagnosis Date Noted   Polyp of ascending colon 12/05/2022   Encounter for screening colonoscopy 12/05/2022   BPH (benign prostatic hyperplasia) 04/19/2022   Status post total knee replacement using cement, left 03/30/2022   Chronic rupture of ACL of left knee 10/21/2021   Post-traumatic osteoarthritis of left knee 10/21/2021   Melanoma of skin (HCC) 06/21/2021   Routine physical examination 06/14/2020   History of coronary artery stent placement 03/06/2016   Beat, premature ventricular 01/26/2015   Ventricular tachycardia (HCC) 01/26/2015   Ventricular premature depolarization 01/26/2015   Heart attack (HCC) 10/16/2014   Myocardial infarction (HCC) 10/16/2014   Coronary artery disease 08/25/2014   HTN (hypertension) 08/25/2014   Acid reflux 08/25/2014   HLD (hyperlipidemia) 08/25/2014    Past Surgical History:  Procedure Laterality Date   COLON SURGERY     partial removal of colon with perforation due to suspected fish bone.   COLONOSCOPY WITH PROPOFOL  N/A 12/05/2022   Procedure: COLONOSCOPY WITH PROPOFOL ;  Surgeon: Jinny Carmine, MD;  Location: [facility]  ENDOSCOPY;  Service: Endoscopy;  Laterality: N/A;   CORONARY ANGIOPLASTY WITH STENT PLACEMENT  10/17/2014   Procedure: CORONARY ANGIOPLASTY WITH STENT PLACEMENT (2.5 x 12 mm Xience Alpine to mLCx); Location: [facility]   CORONARY STENT INTERVENTION Left 10/20/2014   Procedure: CORONARY STENT INTERVENTION (3.0 x 32 mm Synergy DES pRCA, 2.5 x 38 mm Synergy DES dRCA); Location: [facility]   CYSTOSCOPY WITH INSERTION OF UROLIFT N/A 05/09/2022   Procedure:  CYSTOSCOPY WITH INSERTION OF UROLIFT;  Surgeon: Twylla Glendia BROCKS, MD;  Location: [facility] ORS;  Service: Urology;  Laterality: N/A;   ESOPHAGEAL DILATION     KNEE ARTHROSCOPY Left    POLYPECTOMY  12/05/2022   Procedure: POLYPECTOMY;  Surgeon: Jinny Carmine, MD;  Location: [facility] ENDOSCOPY;  Service: Endoscopy;;   TONSILLECTOMY     TOTAL KNEE ARTHROPLASTY Left 03/28/2022   Procedure: TOTAL KNEE ARTHROPLASTY;  Surgeon: Edie Norleen PARAS, MD;  Location: [facility] ORS;  Service: Orthopedics;  Laterality: Left;       Home Medications    Prior to Admission medications   Medication Sig Start Date End Date Taking? Authorizing Provider  sulfamethoxazole -trimethoprim  (BACTRIM  DS) 800-160 MG tablet Take 1 tablet by mouth 2 (two) times daily for 7 days. 05/04/23 05/11/23 Yes Corlis Burnard DEL, NP  Cholecalciferol 25 MCG (1000 UT) capsule Take 1,000 Units by mouth daily.    [provider]  clopidogrel  (PLAVIX ) 75 MG tablet Take 1 tablet (75 mg total) by mouth daily. 08/08/22   Gollan, Timothy J, MD  ezetimibe  (ZETIA ) 10 MG tablet Take 1 tablet (10 mg total) by mouth daily. 08/08/22   Gollan, Timothy J, MD  Magnesium 250 MG TABS Take 1 tablet by mouth daily.    [provider]  omeprazole  (PRILOSEC) 20 MG capsule TAKE 1 CAPSULE BY MOUTH DAILY 08/25/22   Gollan, Timothy J, MD  rosuvastatin  (CRESTOR ) 40 MG tablet Take 1 tablet (40 mg total) by mouth daily. 08/08/22   Gollan, Timothy J, MD  telmisartan -hydrochlorothiazide (MICARDIS  HCT) 40-12.5 MG tablet Take 1 tablet by mouth daily. 08/08/22   Gollan, Timothy J, MD    Family History Family History  Problem Relation Age of Onset   Hyperlipidemia Mother    Cancer Mother        Ovarian, died age 57   Heart disease Father        bypass at age 7   Protein C deficiency Brother    Epilepsy Son     Social History Social History   Tobacco Use   Smoking status: Never    Passive exposure: Never   Smokeless tobacco: Never  Vaping Use   Vaping status: Never  Used  Substance Use Topics   Alcohol use: Yes    Alcohol/week: 3.0 standard drinks of alcohol    Types: 3 Cans of beer per week    Comment: occassional beer 1 day a week   Drug use: No     Allergies   Beta adrenergic blockers   Review of Systems Review of Systems  Constitutional:  Negative for chills and fever.  Gastrointestinal:  Negative for abdominal pain, diarrhea, nausea and vomiting.  Genitourinary:  Positive for dysuria. Negative for flank pain, hematuria and penile discharge.  Skin:  Negative for color change, rash and wound.     Physical Exam Triage Vital Signs ED Triage Vitals [05/04/23 1500]  Encounter Vitals Group     BP 132/82     Systolic BP Percentile      Diastolic BP Percentile  Pulse Rate 60     Resp 18     Temp 98.3 F (36.8 C)     Temp src      SpO2 97 %     Weight      Height      Head Circumference      Peak Flow      Pain Score      Pain Loc      Pain Education      Exclude from Growth Chart    No data found.  Updated Vital Signs BP 132/82   Pulse 60   Temp 98.3 F (36.8 C)   Resp 18   SpO2 97%   Visual Acuity Right Eye Distance:   Left Eye Distance:   Bilateral Distance:    Right Eye Near:   Left Eye Near:    Bilateral Near:     Physical Exam Constitutional:      General: He is not in acute distress. HENT:     Mouth/Throat:     Mouth: Mucous membranes are moist.  Cardiovascular:     Rate and Rhythm: Normal rate and regular rhythm.  Pulmonary:     Effort: Pulmonary effort is normal. No respiratory distress.  Abdominal:     General: Bowel sounds are normal.     Palpations: Abdomen is soft.     Tenderness: There is no abdominal tenderness. There is no right CVA tenderness, left CVA tenderness, guarding or rebound.  Neurological:     Mental Status: He is alert.      UC Treatments / Results  Labs (all labs ordered are listed, but only abnormal results are displayed) Labs Reviewed  POCT URINALYSIS DIP  (MANUAL ENTRY) - Abnormal; Notable for the following components:      Result Value   Clarity, UA cloudy (*)    Blood, UA trace-intact (*)    Leukocytes, UA Large (3+) (*)    All other components within normal limits  URINE CULTURE    EKG   Radiology No results found.  Procedures Procedures (including critical care time)  Medications Ordered in UC Medications - No data to display  Initial Impression / Assessment and Plan / UC Course  I have reviewed the triage vital signs and the nursing notes.  Pertinent labs & imaging results that were available during my care of the patient were reviewed by me and considered in my medical decision making (see chart for details).    Dysuria.  Patient has a history of BPH.  He denies difficulty starting or maintaining a urine stream at this time.  Treating with Bactrim . Urine culture pending. Discussed with patient that we will call him if the urine culture shows the need to change or discontinue the antibiotic. Instructed him to follow-up with his PCP if he is not improving. Patient agrees to plan of care.     Final Clinical Impressions(s) / UC Diagnoses   Final diagnoses:  Dysuria     Discharge Instructions      Take the antibiotic as directed.  The urine culture is pending.  We will call you if it shows the need to change or discontinue your antibiotic.    Follow-up with your primary care provider if your symptoms are not improving.      ED Prescriptions     Medication Sig Dispense Auth. Provider   sulfamethoxazole -trimethoprim  (BACTRIM  DS) 800-160 MG tablet Take 1 tablet by mouth 2 (two) times daily for 7 days. 14 tablet Corlis,  Burnard DEL, NP      PDMP not reviewed this encounter.   Corlis Burnard DEL, NP 05/04/23 [phone]

## 2023-05-04 NOTE — Telephone Encounter (Unsigned)
 Copied from CRM [id number]. Topic: Clinical - Pink Word Triage >> May 04, 2023  9:26 AM Kevin Moore wrote: Reason for Triage: itching when urinating that started this morning when he woke up and when to urinate.No other symptoms,just an itch

## 2023-05-04 NOTE — Discharge Instructions (Signed)
 Take the antibiotic as directed.  The urine culture is pending.  We will call you if it shows the need to change or discontinue your antibiotic.    Follow up with your primary care provider if your symptoms are not improving.

## 2023-05-07 ENCOUNTER — Telehealth (HOSPITAL_COMMUNITY): Payer: Self-pay

## 2023-05-07 LAB — URINE CULTURE

## 2023-05-07 MED ORDER — AMOXICILLIN-POT CLAVULANATE 875-125 MG PO TABS: 1.0000 | ORAL_TABLET | Freq: Two times a day (BID) | ORAL | 0 refills | Status: DC

## 2023-05-07 NOTE — Telephone Encounter (Signed)
 Per Nadyne Austin,  PA-C, "Augmentin  875 BID x 7 days." Per protocol, pt to dc Bactrim .  Reviewed with patient, verified pharmacy, prescription sent.

## 2023-05-08 NOTE — Telephone Encounter (Signed)
Pt called stating he has a dental procedure today, for which he was told to take 2g of Amoxicillin prior to appt. Pt called to inquire if he needed to make any changes to his Augmentin regimen. Per L. Lequita Halt,  PA-C, "Just 1 dose. So, if his appointment is in the morning, replace his morning dose of Augmentin or if it is in the afternoon, they can replace his evening dose of Augmentin." Reviewed with pt. Verbalized understanding.

## 2023-05-21 ENCOUNTER — Encounter: Payer: Self-pay | Admitting: Family

## 2023-05-21 ENCOUNTER — Other Ambulatory Visit: Payer: Self-pay | Admitting: Family

## 2023-05-21 ENCOUNTER — Ambulatory Visit: Payer: HMO

## 2023-05-21 ENCOUNTER — Ambulatory Visit (INDEPENDENT_AMBULATORY_CARE_PROVIDER_SITE_OTHER): Payer: HMO | Admitting: Family

## 2023-05-21 VITALS — BP 138/78 | HR 63 | Temp 97.7°F | Ht 74.0 in | Wt 227.8 lb

## 2023-05-21 DIAGNOSIS — Z23 Encounter for immunization: Secondary | ICD-10-CM | POA: Diagnosis not present

## 2023-05-21 DIAGNOSIS — M545 Low back pain, unspecified: Secondary | ICD-10-CM

## 2023-05-21 DIAGNOSIS — R899 Unspecified abnormal finding in specimens from other organs, systems and tissues: Secondary | ICD-10-CM

## 2023-05-21 DIAGNOSIS — E782 Mixed hyperlipidemia: Secondary | ICD-10-CM

## 2023-05-21 DIAGNOSIS — G8929 Other chronic pain: Secondary | ICD-10-CM

## 2023-05-21 DIAGNOSIS — I10 Essential (primary) hypertension: Secondary | ICD-10-CM

## 2023-05-21 DIAGNOSIS — R0781 Pleurodynia: Secondary | ICD-10-CM | POA: Diagnosis not present

## 2023-05-21 DIAGNOSIS — M549 Dorsalgia, unspecified: Secondary | ICD-10-CM | POA: Insufficient documentation

## 2023-05-21 DIAGNOSIS — M47814 Spondylosis without myelopathy or radiculopathy, thoracic region: Secondary | ICD-10-CM | POA: Diagnosis not present

## 2023-05-21 DIAGNOSIS — M5136 Other intervertebral disc degeneration, lumbar region with discogenic back pain only: Secondary | ICD-10-CM | POA: Diagnosis not present

## 2023-05-21 DIAGNOSIS — M47816 Spondylosis without myelopathy or radiculopathy, lumbar region: Secondary | ICD-10-CM | POA: Diagnosis not present

## 2023-05-21 DIAGNOSIS — R319 Hematuria, unspecified: Secondary | ICD-10-CM | POA: Diagnosis not present

## 2023-05-21 LAB — COMPREHENSIVE METABOLIC PANEL
ALT: 31 U/L (ref 0–53)
AST: 20 U/L (ref 0–37)
Albumin: 4.2 g/dL (ref 3.5–5.2)
Alkaline Phosphatase: 96 U/L (ref 39–117)
BUN: 14 mg/dL (ref 6–23)
CO2: 28 meq/L (ref 19–32)
Calcium: 9.3 mg/dL (ref 8.4–10.5)
Chloride: 104 meq/L (ref 96–112)
Creatinine, Ser: 0.97 mg/dL (ref 0.40–1.50)
GFR: 78.89 mL/min (ref >60.00)
Glucose, Bld: 111 mg/dL — ABNORMAL HIGH (ref 70–99)
Potassium: 4.1 meq/L (ref 3.5–5.1)
Sodium: 141 meq/L (ref 135–145)
Total Bilirubin: 0.6 mg/dL (ref 0.2–1.2)
Total Protein: 7 g/dL (ref 6.0–8.3)

## 2023-05-21 LAB — URINALYSIS, ROUTINE W REFLEX MICROSCOPIC
Bilirubin Urine: NEGATIVE
Hgb urine dipstick: NEGATIVE
Ketones, ur: NEGATIVE
Leukocytes,Ua: NEGATIVE
Nitrite: NEGATIVE
RBC / HPF: NONE SEEN (ref 0–?)
Specific Gravity, Urine: 1.02 (ref 1.000–1.030)
Total Protein, Urine: NEGATIVE
Urine Glucose: NEGATIVE
Urobilinogen, UA: 0.2 (ref 0.0–1.0)
pH: 6 (ref 5.0–8.0)

## 2023-05-21 NOTE — Progress Notes (Signed)
 Assessment & Plan:  Hypertension, unspecified type Assessment & Plan: Slightly elevated today.  Previously has been lower.  Advised patient take blood pressure log at home and call me with readings.  Continue telmisartan hydrochlorothiazide 40-12.5 mg daily for now  Orders: -     Comprehensive metabolic panel  Abnormal laboratory test -     Urinalysis, Routine w reflex microscopic -     Urine Culture  Chronic left-sided low back pain without sciatica Assessment & Plan: Reproducible on exam.  Suspect musculoskeletal.  He politely declines physical therapy referral at this time.  He has historically followed with his chiropractor and he would consider returning to him . pending x-rays  Orders: -     DG Thoracic Spine W/Swimmers; Future  Need for influenza vaccination -     Flu Vaccine Trivalent High Dose (Fluad)  Mixed hyperlipidemia Assessment & Plan: Lab Results  Component Value Date   LDLCALC 62 12/19/2022  Chronic, stable.  Continue lipitor 40mg  and zetia 10mg .       Return precautions given.   Risks, benefits, and alternatives of the medications and treatment plan prescribed today were discussed, and patient expressed understanding.   Education regarding symptom management and diagnosis given to patient on AVS either electronically or printed.  No follow-ups on file.  Rennie Plowman, FNP  Subjective:    Patient ID: Kevin Moore, male    DOB: 12/23/1950, 73 y.o.   MRN: [id number]  CC: Kevin Moore is a 73 y.o. male who presents today for follow up.   HPI: Complains of left sided 'muscle or rib' pain for 1 year, unchanged. Episodic as some days pain is not present.   May radiate to the front left side of abomen. Pain can happen on right of back as well  Can aggregate pain by pressing on the area of his back and describes as 'sore'.   Advil with relief.   No pain when walking. No numbness, groin pain, abdominal pain.      H/o melanoma;  following with Dr Adolphus Birchwood  Treated for UTI staph epidermidis with augmentin 875 BID x 7 days; trace blood in UA, large  leukocytes Follow up Dr Lonna Cobb next month  Last seen by cardiology 08/14/2022, Dr. Mariah Milling for follow-up CAD s/p stent, h/o ventricular tachycardia  Allergies: Beta adrenergic blockers Current Outpatient Medications on File Prior to Visit  Medication Sig Dispense Refill   Cholecalciferol 25 MCG (1000 UT) capsule Take 1,000 Units by mouth daily.     clopidogrel (PLAVIX) 75 MG tablet Take 1 tablet (75 mg total) by mouth daily. 90 tablet 3   ezetimibe (ZETIA) 10 MG tablet Take 1 tablet (10 mg total) by mouth daily. 90 tablet 3   Magnesium 250 MG TABS Take 1 tablet by mouth daily.     omeprazole (PRILOSEC) 20 MG capsule TAKE 1 CAPSULE BY MOUTH DAILY 30 capsule 11   rosuvastatin (CRESTOR) 40 MG tablet Take 1 tablet (40 mg total) by mouth daily. 90 tablet 3   telmisartan-hydrochlorothiazide (MICARDIS HCT) 40-12.5 MG tablet Take 1 tablet by mouth daily. 90 tablet 3   amoxicillin-clavulanate (AUGMENTIN) 875-125 MG tablet Take 1 tablet by mouth every 12 (twelve) hours. (Patient not taking: Reported on 05/21/2023) 14 tablet 0   No current facility-administered medications on file prior to visit.    Review of Systems  Constitutional:  Negative for chills and fever.  Respiratory:  Negative for cough.   Cardiovascular:  Negative for chest pain and palpitations.  Gastrointestinal:  Negative for nausea and vomiting.  Genitourinary:  Negative for difficulty urinating.  Musculoskeletal:  Positive for back pain.  Neurological:  Negative for numbness.      Objective:    BP 138/78   Pulse 63   Temp 97.7 F (36.5 C) (Oral)   Ht 6\' 2"  (1.88 m)   Wt 227 lb 12.8 oz (103.3 kg)   SpO2 98%   BMI 29.25 kg/m  BP Readings from Last 3 Encounters:  05/21/23 138/78  05/04/23 132/82  12/13/22 (!) 151/94   Wt Readings from Last 3 Encounters:  05/21/23 227 lb 12.8 oz (103.3 kg)  12/13/22  218 lb (98.9 kg)  12/05/22 218 lb (98.9 kg)    Physical Exam Vitals reviewed.  Constitutional:      Appearance: He is well-developed.  Cardiovascular:     Rate and Rhythm: Regular rhythm.     Heart sounds: Normal heart sounds.  Pulmonary:     Effort: Pulmonary effort is normal. No respiratory distress.     Breath sounds: Normal breath sounds. No wheezing, rhonchi or rales.  Musculoskeletal:       Arms:     Lumbar back: No swelling, spasms or tenderness. Normal range of motion.     Comments: Full range of motion with flexion, extension, lateral side bends. No pain, numbness, tingling elicited with single leg raise bilaterally.  Discomfort with deep palpation left thoracic back proximal to rib cage posteriorly and anteriorly as marked on diagram   No bony prominence, rash, mass   Lymphadenopathy:     Head:     Left side of head: No submandibular or preauricular adenopathy.  Skin:    General: Skin is warm and dry.  Neurological:     Mental Status: He is alert.  Psychiatric:        Speech: Speech normal.        Behavior: Behavior normal.

## 2023-05-21 NOTE — Assessment & Plan Note (Signed)
 Lab Results  Component Value Date   LDLCALC 62 12/19/2022  Chronic, stable.  Continue lipitor 40mg  and zetia 10mg .

## 2023-05-21 NOTE — Assessment & Plan Note (Signed)
 Reproducible on exam.  Suspect musculoskeletal.  He politely declines physical therapy referral at this time.  He has historically followed with his chiropractor and he would consider returning to him . pending x-rays

## 2023-05-21 NOTE — Assessment & Plan Note (Signed)
 Slightly elevated today.  Previously has been lower.  Advised patient take blood pressure log at home and call me with readings.  Continue telmisartan hydrochlorothiazide 40-12.5 mg daily for now

## 2023-05-21 NOTE — Patient Instructions (Addendum)
 It is imperative that you are seen AT least twice per year for labs and monitoring. Monitor blood pressure at home and me 5-6 reading on separate days. Goal is less than 120/80, based on newest guidelines, however we certainly want to be less than 130/80;  if persistently higher, please make sooner follow up appointment so we can recheck you blood pressure and manage/ adjust medications.    Pending x-rays today.  As long as pain is episodic, not exquisite, if you would  like to reach out to chiropractor, that is okay.  If you like to consider physical therapy referral, please let me know

## 2023-05-22 ENCOUNTER — Ambulatory Visit: Payer: PPO | Admitting: Family

## 2023-05-22 DIAGNOSIS — D2272 Melanocytic nevi of left lower limb, including hip: Secondary | ICD-10-CM | POA: Diagnosis not present

## 2023-05-22 DIAGNOSIS — D2261 Melanocytic nevi of right upper limb, including shoulder: Secondary | ICD-10-CM | POA: Diagnosis not present

## 2023-05-22 DIAGNOSIS — Z85828 Personal history of other malignant neoplasm of skin: Secondary | ICD-10-CM | POA: Diagnosis not present

## 2023-05-22 DIAGNOSIS — D2262 Melanocytic nevi of left upper limb, including shoulder: Secondary | ICD-10-CM | POA: Diagnosis not present

## 2023-05-22 DIAGNOSIS — D225 Melanocytic nevi of trunk: Secondary | ICD-10-CM | POA: Diagnosis not present

## 2023-05-22 DIAGNOSIS — L570 Actinic keratosis: Secondary | ICD-10-CM | POA: Diagnosis not present

## 2023-05-22 LAB — URINE CULTURE
MICRO NUMBER:: 16119952
Result:: NO GROWTH
SPECIMEN QUALITY:: ADEQUATE

## 2023-05-23 ENCOUNTER — Encounter: Payer: Self-pay | Admitting: Family

## 2023-05-31 DIAGNOSIS — D0462 Carcinoma in situ of skin of left upper limb, including shoulder: Secondary | ICD-10-CM | POA: Diagnosis not present

## 2023-06-06 ENCOUNTER — Encounter: Payer: Self-pay | Admitting: Family

## 2023-06-13 ENCOUNTER — Ambulatory Visit (INDEPENDENT_AMBULATORY_CARE_PROVIDER_SITE_OTHER): Payer: PPO | Admitting: Urology

## 2023-06-13 VITALS — BP 130/80 | HR 74 | Ht 70.0 in | Wt 223.0 lb

## 2023-06-13 DIAGNOSIS — N138 Other obstructive and reflux uropathy: Secondary | ICD-10-CM

## 2023-06-13 DIAGNOSIS — N401 Enlarged prostate with lower urinary tract symptoms: Secondary | ICD-10-CM | POA: Diagnosis not present

## 2023-06-13 LAB — BLADDER SCAN AMB NON-IMAGING: Scan Result: 222

## 2023-06-13 MED ORDER — CEFUROXIME AXETIL 500 MG PO TABS: 500.0000 mg | ORAL_TABLET | Freq: Two times a day (BID) | ORAL | 0 refills | Status: AC

## 2023-06-13 NOTE — Progress Notes (Signed)
 I, Kevin Moore, acting as a scribe for Kevin Altes, MD., have documented all relevant documentation on the behalf of Kevin Altes, MD, as directed by Kevin Altes, MD while in the presence of Kevin Altes, MD.  06/13/2023 9:42 PM   Leeroy Bock Moore 1950/04/29 [id number]  Referring provider: Allegra Grana, FNP [address] [place],  [place] [address]  Chief Complaint  Patient presents with   Benign Prostatic Hypertrophy   Urologic history: 1. History of BPH with incomplete bladder emptying Subsequently developed symptomatic urinary retention after total knee replacement and failed several voiding trials. Status post urolift 05/09/22. Continues to void with an excellent stream and has no bothersome LUTS. No longer taking an alpha blocker. Post-Urolift residual approximately 250 mL  HPI: Kevin Moore is a 73 y.o. male presents for follow-up visit.  Since his last visit, he saw his PCP in early February 2024 complaining of mild dysuria but no change in his voiding pattern. Urinalysis showed large leukocytes and urine culture grew 50,000 colonies of Staph epidermidis. He was treated with a course of Augmentin with resolution of his symptoms  No complaints today   PMH: Past Medical History:  Diagnosis Date   Acute ST elevation myocardial infarction (STEMI) of inferior wall (HCC) 10/17/2014   a.) LHC 10/17/2014: <50% LAD, 50-60% prox mid diag, 50% pLCx, 99% mid major marg, 80-90% prox 2/3s RCA - during cath, spontaneous occ of subtotal LCx caused VF (defib 200J x 2). PCI of LCx (2.5 x 12 mm Xience Alpine). Started on amio gtt (bradycardia). Started on dopamine (SOB - d/c'd). Post-cath, dev WCT/polymorphic VT. EP consulted and lidocaine gtt started; b.) PCI of RCA (DES x2) on 10/20/2014   Beta-blocker intolerance    a.) causes profound bradycardia   BPH without urinary obstruction 03/2022   Coronary artery disease    a.) LHC 10/17/2014: <50%  LAD, 50-60% prox portion of mid diagonal, 50% pLCx, 99% mid major marginal, 80-90% proximal 2/3s RCA  --> PCI performed placing a 2.5 x 12 mm Xience Alpine DES x 1 mLCx.; b.) PCI 10/20/2014: 80-90% o-mRCA --> 3.0 x 32 mm (proximal) and 2.5 x 38 mm (distal) Synergy DES placed.   GERD (gastroesophageal reflux disease)    History of colonic polyps    HLD (hyperlipidemia)    Hypertension    Long term current use of antithrombotics/antiplatelets    a.) clopidogrel   Melanoma (HCC)    Osteoarthritis    Polymorphic ventricular tachycardia (HCC) 10/17/2014   a.) post cardiac catheterization --> developed WCT/polymorphic VT. EP consulted and lidocaine gtt started --> felt to be secondary to known RCA disease. PCI of RCA recommended; no AICD deemed necessary at that time.   Prostatitis    Ventricular fibrillation (HCC) 10/17/2014   a.) in setting of STEMI 10/17/2014 --> during cath, spontaneous occlusion of subtotal LCx caused VF requring defib 200J x 2. PCI of LCx (2.5 x 12 mm Xience Alpine DES). Started on amiodarone gtt, which caused bradycardia. Started on dopamine, which caused SOB (d/c'd).    Surgical History: Past Surgical History:  Procedure Laterality Date   COLON SURGERY     partial removal of colon with perforation due to suspected fish bone.   COLONOSCOPY WITH PROPOFOL N/A 12/05/2022   Procedure: COLONOSCOPY WITH PROPOFOL;  Surgeon: [name], MD;  Location[facility] ENDOSCOPY;  Service: Endoscopy;  Laterality: N/A;   CORONARY ANGIOPLASTY WITH STENT PLACEMENT  10/17/2014   Procedure: CORONARY  ANGIOPLASTY WITH STENT PLACEMENT (2.5 x 12 mm Xience Alpine to [employer]); Location: [facility]   CORONARY STENT INTERVENTION Left 10/20/2014   Procedure: CORONARY STENT INTERVENTION (3.0 x 32 mm Synergy DES pRCA, 2.5 x 38 mm Synergy DES dRCA); Location: [facility]   CYSTOSCOPY WITH INSERTION OF UROLIFT N/A 05/09/2022   Procedure: CYSTOSCOPY WITH INSERTION OF UROLIFT;  Surgeon: Kevin Altes, MD;  Location: [facility] ORS;   Service: Urology;  Laterality: N/A;   ESOPHAGEAL DILATION     KNEE ARTHROSCOPY Left    POLYPECTOMY  12/05/2022   Procedure: POLYPECTOMY;  Surgeon: [name], MD;  Location: [facility] ENDOSCOPY;  Service: Endoscopy;;   TONSILLECTOMY     TOTAL KNEE ARTHROPLASTY Left 03/28/2022   Procedure: TOTAL KNEE ARTHROPLASTY;  Surgeon: Christena Flake, MD;  Location: [facility] ORS;  Service: Orthopedics;  Laterality: Left;    Home Medications:  Allergies as of 06/13/2023       Reactions   Beta Adrenergic Blockers Other (See Comments)   Heart rate drops into the 20s if he takes beta blockers.        Medication List        Accurate as of June 13, 2023  9:42 PM. If you have any questions, ask your nurse or doctor.          STOP taking these medications    amoxicillin-clavulanate 875-125 MG tablet Commonly known as: AUGMENTIN       TAKE these medications    cefUROXime 500 MG tablet Commonly known as: CEFTIN Take 1 tablet (500 mg total) by mouth 2 (two) times daily with a meal for 7 days.   Cholecalciferol 25 MCG (1000 UT) capsule Take 1,000 Units by mouth daily.   clopidogrel 75 MG tablet Commonly known as: PLAVIX Take 1 tablet (75 mg total) by mouth daily.   ezetimibe 10 MG tablet Commonly known as: ZETIA Take 1 tablet (10 mg total) by mouth daily.   Magnesium 250 MG Tabs Take 1 tablet by mouth daily.   omeprazole 20 MG capsule Commonly known as: PRILOSEC TAKE 1 CAPSULE BY MOUTH DAILY   rosuvastatin 40 MG tablet Commonly known as: CRESTOR Take 1 tablet (40 mg total) by mouth daily.   telmisartan-hydrochlorothiazide 40-12.5 MG tablet Commonly known as: MICARDIS HCT Take 1 tablet by mouth daily.        Allergies:  Allergies  Allergen Reactions   Beta Adrenergic Blockers Other (See Comments)    Heart rate drops into the 20s if he takes beta blockers.    Family History: Family History  Problem Relation Age of Onset   Hyperlipidemia Mother    Cancer Mother         Ovarian, died age 47   Heart disease Father        bypass at age 59   Protein C deficiency Brother    Epilepsy Son     Social History:  reports that he has never smoked. He has never been exposed to tobacco smoke. He has never used smokeless tobacco. He reports current alcohol use of about 3.0 standard drinks of alcohol per week. He reports that he does not use drugs.   Physical Exam: BP 130/80   Pulse 74   Ht 5\' 10"  (1.778 m)   Wt 223 lb (101.2 kg)   BMI 32.00 kg/m   Constitutional:  Alert and oriented, No acute distress. HEENT: [place] AT, moist mucus membranes.  Trachea midline, no masses. Cardiovascular: No clubbing, cyanosis, or edema. Respiratory: Normal respiratory effort,  no increased work of breathing. GI: Abdomen is soft, nontender, nondistended, no abdominal masses Skin: No rashes, bruises or suspicious lesions. Neurologic: Grossly intact, no focal deficits, moving all 4 extremities. Psychiatric: Normal mood and affect.   Assessment & Plan:    1. BPH with incomplete bladder emptying No bothersome lower urinary tract symptoms PVR today 222 mL which is stable. He was to have his PSA drawn. Fall 2024, however it does not look like this was performed. This was noted after he left the office and will contact for a follow-up PSA. 1 year follow-up with PVR. He is traveling to [place] within the next few weeks, and we'll send in an antibiotic Rx for him to have on hand should he develop UTI symptoms while traveling. UTI most likely secondary to his incomplete bladder emptying.  [facility] [address] [place], [place] [address] [phone]

## 2023-06-14 ENCOUNTER — Encounter: Payer: Self-pay | Admitting: Urology

## 2023-06-14 ENCOUNTER — Other Ambulatory Visit: Payer: Self-pay | Admitting: *Deleted

## 2023-06-14 ENCOUNTER — Other Ambulatory Visit

## 2023-06-14 DIAGNOSIS — C44519 Basal cell carcinoma of skin of other part of trunk: Secondary | ICD-10-CM | POA: Diagnosis not present

## 2023-06-14 DIAGNOSIS — R972 Elevated prostate specific antigen [PSA]: Secondary | ICD-10-CM

## 2023-07-12 ENCOUNTER — Ambulatory Visit
Admission: RE | Admit: 2023-07-12 | Discharge: 2023-07-12 | Disposition: A | Discharge status: Home / Self Care | Source: Ambulatory Visit | Attending: Family Medicine

## 2023-07-12 ENCOUNTER — Other Ambulatory Visit: Payer: Self-pay

## 2023-07-12 VITALS — BP 158/93 | HR 76 | Temp 98.0°F | Resp 18

## 2023-07-12 DIAGNOSIS — N3090 Cystitis, unspecified without hematuria: Secondary | ICD-10-CM | POA: Diagnosis not present

## 2023-07-12 LAB — POCT URINALYSIS DIP (MANUAL ENTRY)
Bilirubin, UA: NEGATIVE
Blood, UA: NEGATIVE
Glucose, UA: NEGATIVE mg/dL
Ketones, POC UA: NEGATIVE mg/dL
Nitrite, UA: NEGATIVE
Protein Ur, POC: NEGATIVE mg/dL
Spec Grav, UA: <=1.005 — AB
Urobilinogen, UA: 0.2 U/dL
pH, UA: 6

## 2023-07-12 MED ORDER — AMOXICILLIN-POT CLAVULANATE 875-125 MG PO TABS
1.0000 | ORAL_TABLET | Freq: Two times a day (BID) | ORAL | 0 refills | Status: AC
Start: 2023-07-12 — End: 2023-07-19

## 2023-07-12 NOTE — ED Triage Notes (Signed)
 Patient presents to [facility] for evauluation of burning with urination on and off yesterday.  Has a hx of urinary issues following a catheter for a knee surgery, and is concerned when he has symptoms and always gets checked.  States symptoms resolved today and urine is more clear.

## 2023-07-12 NOTE — ED Provider Notes (Signed)
Kevin Moore    CSN: [id number] Arrival date & time: 07/12/23  1418      History   Chief Complaint Chief Complaint  Patient presents with   Dysuria    HPI Kevin Moore is a 73 y.o. male.   Patient here for evaluation of dysuria x 1 day.  Patient has a history of recurrent UTIs related to history of bladder injury related to a catheter.  He reports that he had a UTI back in February.  On chart review the patient had a positive urine culture and was treated successfully with Augmentin.  Patient is going to be going on a 2-week trip to [place] concern he may have an infection most of the symptoms have subsided some today.  I have fever, nausea or vomiting.  Past Medical History:  Diagnosis Date   Acute ST elevation myocardial infarction (STEMI) of inferior wall (HCC) 10/17/2014   a.) LHC 10/17/2014: <50% LAD, 50-60% prox mid diag, 50% pLCx, 99% mid major marg, 80-90% prox 2/3s RCA - during cath, spontaneous occ of subtotal LCx caused VF (defib 200J x 2). PCI of LCx (2.5 x 12 mm Xience Alpine). Started on amio gtt (bradycardia). Started on dopamine (SOB - d/c'd). Post-cath, dev WCT/polymorphic VT. EP consulted and lidocaine gtt started; b.) PCI of RCA (DES x2) on 10/20/2014   Beta-blocker intolerance    a.) causes profound bradycardia   BPH without urinary obstruction 03/2022   Coronary artery disease    a.) LHC 10/17/2014: <50% LAD, 50-60% prox portion of mid diagonal, 50% pLCx, 99% mid major marginal, 80-90% proximal 2/3s RCA  --> PCI performed placing a 2.5 x 12 mm Xience Alpine DES x 1 mLCx.; b.) PCI 10/20/2014: 80-90% o-mRCA --> 3.0 x 32 mm (proximal) and 2.5 x 38 mm (distal) Synergy DES placed.   GERD (gastroesophageal reflux disease)    History of colonic polyps    HLD (hyperlipidemia)    Hypertension    Long term current use of antithrombotics/antiplatelets    a.) clopidogrel   Melanoma (HCC)    Osteoarthritis    Polymorphic ventricular tachycardia (HCC)  10/17/2014   a.) post cardiac catheterization --> developed WCT/polymorphic VT. EP consulted and lidocaine gtt started --> felt to be secondary to known RCA disease. PCI of RCA recommended; no AICD deemed necessary at that time.   Prostatitis    Ventricular fibrillation (HCC) 10/17/2014   a.) in setting of STEMI 10/17/2014 --> during cath, spontaneous occlusion of subtotal LCx caused VF requring defib 200J x 2. PCI of LCx (2.5 x 12 mm Xience Alpine DES). Started on amiodarone gtt, which caused bradycardia. Started on dopamine, which caused SOB (d/c'd).    Patient Active Problem List   Diagnosis Date Noted   Left-sided back pain 05/21/2023   Polyp of ascending colon 12/05/2022   Encounter for screening colonoscopy 12/05/2022   BPH (benign prostatic hyperplasia) 04/19/2022   Status post total knee replacement using cement, left 03/30/2022   Chronic rupture of ACL of left knee 10/21/2021   Post-traumatic osteoarthritis of left knee 10/21/2021   Melanoma of skin (HCC) 06/21/2021   Routine physical examination 06/14/2020   History of coronary artery stent placement 03/06/2016   Beat, premature ventricular 01/26/2015   Ventricular tachycardia (HCC) 01/26/2015   Ventricular premature depolarization 01/26/2015   Heart attack (HCC) 10/16/2014   Myocardial infarction (HCC) 10/16/2014   Coronary artery disease 08/25/2014   HTN (hypertension) 08/25/2014   Acid reflux 08/25/2014   HLD (hyperlipidemia) 08/25/2014  Past Surgical History:  Procedure Laterality Date   COLON SURGERY     partial removal of colon with perforation due to suspected fish bone.   COLONOSCOPY WITH PROPOFOL N/A 12/05/2022   Procedure: COLONOSCOPY WITH PROPOFOL;  Surgeon: Marnee Sink, MD;  Location: [facility] ENDOSCOPY;  Service: Endoscopy;  Laterality: N/A;   CORONARY ANGIOPLASTY WITH STENT PLACEMENT  10/17/2014   Procedure: CORONARY ANGIOPLASTY WITH STENT PLACEMENT (2.5 x 12 mm Xience Alpine to mLCx); Location: [facility]    CORONARY STENT INTERVENTION Left 10/20/2014   Procedure: CORONARY STENT INTERVENTION (3.0 x 32 mm Synergy DES pRCA, 2.5 x 38 mm Synergy DES dRCA); Location: [facility]   CYSTOSCOPY WITH INSERTION OF UROLIFT N/A 05/09/2022   Procedure: CYSTOSCOPY WITH INSERTION OF UROLIFT;  Surgeon: Geraline Knapp, MD;  Location: [facility] ORS;  Service: Urology;  Laterality: N/A;   ESOPHAGEAL DILATION     KNEE ARTHROSCOPY Left    POLYPECTOMY  12/05/2022   Procedure: POLYPECTOMY;  Surgeon: Marnee Sink, MD;  Location: [facility] ENDOSCOPY;  Service: Endoscopy;;   TONSILLECTOMY     TOTAL KNEE ARTHROPLASTY Left 03/28/2022   Procedure: TOTAL KNEE ARTHROPLASTY;  Surgeon: Elner Hahn, MD;  Location: [facility] ORS;  Service: Orthopedics;  Laterality: Left;       Home Medications    Prior to Admission medications   Medication Sig Start Date End Date Taking? Authorizing Provider  amoxicillin-clavulanate (AUGMENTIN) 875-125 MG tablet Take 1 tablet by mouth every 12 (twelve) hours for 7 days. 07/12/23 07/19/23 Yes Buena Carmine, NP  Cholecalciferol 25 MCG (1000 UT) capsule Take 1,000 Units by mouth daily.    [provider]  clopidogrel (PLAVIX) 75 MG tablet Take 1 tablet (75 mg total) by mouth daily. 08/08/22   Gollan, Timothy J, MD  ezetimibe (ZETIA) 10 MG tablet Take 1 tablet (10 mg total) by mouth daily. 08/08/22   Gollan, Timothy J, MD  Magnesium 250 MG TABS Take 1 tablet by mouth daily.    [provider]  omeprazole (PRILOSEC) 20 MG capsule TAKE 1 CAPSULE BY MOUTH DAILY 08/25/22   Gollan, Timothy J, MD  rosuvastatin (CRESTOR) 40 MG tablet Take 1 tablet (40 mg total) by mouth daily. 08/08/22   Gollan, Timothy J, MD  telmisartan-hydrochlorothiazide (MICARDIS HCT) 40-12.5 MG tablet Take 1 tablet by mouth daily. 08/08/22   Gollan, Timothy J, MD    Family History Family History  Problem Relation Age of Onset   Hyperlipidemia Mother    Cancer Mother        Ovarian, died age 4   Heart disease Father        bypass  at age 49   Protein C deficiency Brother    Epilepsy Son     Social History Social History   Tobacco Use   Smoking status: Never    Passive exposure: Never   Smokeless tobacco: Never  Vaping Use   Vaping status: Never Used  Substance Use Topics   Alcohol use: Yes    Alcohol/week: 3.0 standard drinks of alcohol    Types: 3 Cans of beer per week    Comment: occassional beer 1 day a week   Drug use: No     Allergies   Beta adrenergic blockers   Review of Systems Review of Systems  Genitourinary:  Positive for dysuria.     Physical Exam Triage Vital Signs ED Triage Vitals [07/12/23 1457]  Encounter Vitals Group     BP (!) 158/93     Systolic BP Percentile  Diastolic BP Percentile      Pulse Rate 76     Resp 18     Temp 98 F (36.7 C)     Temp Source Temporal     SpO2 95 %     Weight      Height      Head Circumference      Peak Flow      Pain Score 0     Pain Loc      Pain Education      Exclude from Growth Chart    No data found.  Updated Vital Signs BP (!) 158/93 (BP Location: Left Arm)   Pulse 76   Temp 98 F (36.7 C) (Temporal)   Resp 18   SpO2 95%   Visual Acuity Right Eye Distance:   Left Eye Distance:   Bilateral Distance:    Right Eye Near:   Left Eye Near:    Bilateral Near:     Physical Exam General appearance: Alert, well developed, well nourished, cooperative  Head: Normocephalic, without obvious abnormality, atraumatic Heart: Rate and Rhythm normal.   Respiratory: Respirations even and unlabored, normal respiratory rate CVA:  Negative for flank pain Extremities: No gross deformities Skin: Skin color, texture, turgor normal. No rashes seen  Psych: Appropriate mood and affect.   UC Treatments / Results  Labs (all labs ordered are listed, but only abnormal results are displayed) Labs Reviewed  POCT URINALYSIS DIP (MANUAL ENTRY) - Abnormal; Notable for the following components:      Result Value   Color, UA light  yellow (*)    Spec Grav, UA <=1.005 (*)    Leukocytes, UA Small (1+) (*)    All other components within normal limits  URINE CULTURE    EKG   Radiology No results found.  Procedures Procedures (including critical care time)  Medications Ordered in UC Medications - No data to display  Initial Impression / Assessment and Plan / UC Course  I have reviewed the triage vital signs and the nursing notes.  Pertinent labs & imaging results that were available during my care of the patient were reviewed by me and considered in my medical decision making (see chart for details).    Recurrent cystitis, patient will be leaving on a 2-week trip and will not be accessible to her pharmacy while on a 2-week cruise.  Given small leukocytes, dysuria, and patient's history agreed to treat empirically prophylactically with Augmentin twice daily for 7 days.  Urine culture ordered and pending.  Patient advised we will still reach out to him if there is any changes warranted when urine culture results are available.  Patient verbalized understanding and agreement with plan. Final Clinical Impressions(s) / UC Diagnoses   Final diagnoses:  Recurrent cystitis     Discharge Instructions      Urinalysis shows a small amount of bacteria in your urine.   I will cover you with 3 days of antibiotics while we wait on your urine culture results.  Culture results typically take 2 to 3 days to return from the lab.  If any changes in treatment are warranted we will contact you by phone    ED Prescriptions     Medication Sig Dispense Auth. Provider   amoxicillin-clavulanate (AUGMENTIN) 875-125 MG tablet Take 1 tablet by mouth every 12 (twelve) hours for 7 days. 14 tablet Buena Carmine, NP      PDMP not reviewed this encounter.   Buena Carmine, NP  07/12/23 1604  

## 2023-07-12 NOTE — Discharge Instructions (Signed)
 Urinalysis shows a small amount of bacteria in your urine.   I will cover you with 3 days of antibiotics while we wait on your urine culture results.  Culture results typically take 2 to 3 days to return from the lab.  If any changes in treatment are warranted we will contact you by phone

## 2023-07-14 LAB — URINE CULTURE
Culture: >=100000 — AB
Special Requests: NORMAL

## 2023-08-16 NOTE — Progress Notes (Signed)
 Cardiology Office Note  Date:  08/17/2023   ID:  Kevin Moore, DOB [birth year], MRN [id number]  PCP:  Calista Catching, FNP   Chief Complaint  Patient presents with   12 month follow up     "Doing well."     HPI:  Mr. 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 [place]  Recent cruise to [place]  Owns mobile home down at [employer] work reviewed Total cholesterol 127 LDL 62 Tolerating Crestor  40 daily and Zetia  10 daily  EKG personally reviewed by myself on todays visit EKG Interpretation Date/Time:  Friday Aug 17 2023 14:31:20 EDT Ventricular Rate:  58 PR Interval:  100 QRS Duration:  86 QT Interval:  442 QTC Calculation: 433 R Axis:   -10  Text Interpretation: Sinus bradycardia with short PR with Premature atrial complexes When compared with ECG of 15-Mar-2022 14:05, Premature atrial complexes are now Present Confirmed by Belva Boyden [phone]) on 08/17/2023 2:36:34 PM   Bradycardia on B-Blocker, tried previously  other past medical history reviewed  admitted on 10/16/14 as a STEMI , presented to [facility] and experienced Vfib prior to opening his mid Cx occlusion which was successfully stented. He had moderate to severe RCA disease  He  developed polymorphic VT thought secondary to the RCA  Previously unable to tolerate beta blocker as he develops profound bradycardia. EP was consulted. They recommended Lidocaine  drip  and stenting x2 of the RCA. once he was stented, he had no further ectopy.   Had MRI that showed scar from prior MI likely causing PVC's, EF 66%, Recommendation was made for no AICD.   PMH:   has a past medical history of Acute ST elevation myocardial infarction (STEMI) of inferior wall (HCC) (10/17/2014), Beta-blocker intolerance, BPH without urinary obstruction (03/2022), Coronary artery disease, GERD (gastroesophageal reflux disease), History of colonic polyps, HLD (hyperlipidemia), Hypertension, Long term current use of antithrombotics/antiplatelets, Melanoma (HCC), Osteoarthritis, Polymorphic ventricular tachycardia (HCC) (10/17/2014), Prostatitis, and Ventricular fibrillation (HCC) (10/17/2014).  PSH:    Past Surgical History:  Procedure Laterality Date   COLON SURGERY     partial removal of colon with perforation due to suspected fish bone.   COLONOSCOPY WITH PROPOFOL  N/A 12/05/2022   Procedure: COLONOSCOPY WITH PROPOFOL ;  Surgeon: Marnee Sink, MD;  Location: [facility] ENDOSCOPY;  Service: Endoscopy;  Laterality: N/A;   CORONARY ANGIOPLASTY WITH STENT PLACEMENT  10/17/2014   Procedure: CORONARY ANGIOPLASTY WITH STENT PLACEMENT (2.5 x 12 mm Xience Alpine to mLCx); Location: [facility]   CORONARY STENT INTERVENTION Left 10/20/2014   Procedure: CORONARY STENT INTERVENTION (3.0 x 32 mm Synergy DES pRCA, 2.5 x 38 mm Synergy DES dRCA); Location: [facility]   CYSTOSCOPY WITH INSERTION OF UROLIFT N/A 05/09/2022   Procedure: CYSTOSCOPY WITH INSERTION OF UROLIFT;  Surgeon: Geraline Knapp, MD;  Location: [facility] ORS;  Service: Urology;  Laterality: N/A;   ESOPHAGEAL DILATION  KNEE ARTHROSCOPY Left    POLYPECTOMY  12/05/2022   Procedure: POLYPECTOMY;  Surgeon: Marnee Sink, MD;  Location: [facility] ENDOSCOPY;  Service: Endoscopy;;   TONSILLECTOMY     TOTAL KNEE ARTHROPLASTY Left 03/28/2022   Procedure: TOTAL KNEE ARTHROPLASTY;  Surgeon: Elner Hahn, MD;  Location: [facility] ORS;  Service: Orthopedics;  Laterality: Left;     Current Outpatient Medications  Medication Sig Dispense Refill   Cholecalciferol 25 MCG (1000 UT) capsule Take 1,000 Units by mouth daily.     clopidogrel  (PLAVIX ) 75 MG tablet Take 1 tablet (75 mg total) by mouth daily. 90 tablet 3   ezetimibe  (ZETIA ) 10 MG tablet Take 1 tablet (10 mg total) by mouth daily. 90 tablet 3   Magnesium 250 MG TABS Take 1 tablet by mouth daily.     omeprazole  (PRILOSEC) 20 MG capsule TAKE 1 CAPSULE BY MOUTH DAILY 30 capsule 11   rosuvastatin  (CRESTOR ) 40 MG tablet Take 1 tablet (40 mg total) by mouth daily. 90 tablet 3   telmisartan -hydrochlorothiazide (MICARDIS  HCT) 40-12.5 MG tablet Take 1 tablet by mouth daily. 90 tablet 3   No current facility-administered medications for this visit.    Allergies:   Beta adrenergic blockers   Social History:  The patient  reports that he has never smoked. He has never been exposed to tobacco smoke. He has never used smokeless tobacco. He reports current alcohol use of about 3.0 standard drinks of alcohol per week. He reports that he does not use drugs.   Family History:   family history includes Cancer in his mother; Epilepsy in his son; Heart disease in his father; Hyperlipidemia in his mother; Protein C deficiency in his brother.    Review of Systems: Review of Systems  Constitutional: Negative.   Respiratory: Negative.    Cardiovascular: Negative.   Gastrointestinal: Negative.   Musculoskeletal:  Positive for joint pain.  Neurological: Negative.   Psychiatric/Behavioral: Negative.    All other systems reviewed and are negative.   PHYSICAL EXAM: VS:  BP (!) 160/90 (BP Location: Left Arm, Patient Position: Sitting, Cuff Size: Normal)   Pulse (!) 58   Ht 6\' 2"  (1.88 m)   Wt 224 lb 2 oz (101.7 kg)   SpO2 98%   BMI 28.78 kg/m  , BMI Body mass index is 28.78 kg/m. Constitutional:  oriented to person, place, and time. No distress.  HENT:  Head: Grossly normal Eyes:  no discharge. No scleral icterus.   Neck: No JVD, no carotid bruits  Cardiovascular: Regular rate and rhythm, no murmurs appreciated Pulmonary/Chest: Clear to auscultation bilaterally, no wheezes or rales Abdominal: Soft.  no distension.  no tenderness.  Musculoskeletal: Normal range of motion Neurological:  normal muscle tone. Coordination normal. No atrophy Skin: Skin warm and dry Psychiatric: normal affect, pleasant   Recent Labs: 05/21/2023: ALT 31; BUN 14; Creatinine, Ser 0.97; Potassium 4.1; Sodium 141    Lipid Panel Lab Results  Component Value Date   CHOL 127 12/19/2022   HDL 43.50 12/19/2022   LDLCALC 62 12/19/2022   TRIG 109.0 12/19/2022      Wt Readings from Last 3 Encounters:  08/17/23 224 lb 2 oz (101.7 kg)  06/13/23 223 lb (101.2 kg)  05/21/23 227 lb 12.8 oz (103.3 kg)     ASSESSMENT AND PLAN:  Coronary artery disease involving native coronary artery of native heart with stable angina Currently with no symptoms of angina. No further workup at this time. Continue current medication regimen.  Mixed hyperlipidemia  Tolerating Crestor  and Zetia , numbers at goal, no significant myalgias  Essential (primary) hypertension Initial blood pressure elevated, improved, recheck Continue telmisartan  HCTZ at current dose  blood pressure well-controlled  Ventricular tachycardia (HCC)  prior history of V. fib arrest prior to stent placement left circumflex and RCA  Felt not to be a ICD candidate based on EP workup at [facility] 2016  Not on beta-blocker secondary to bradycardia Denies syncope or near syncope or palpitations concerning for arrhythmia  History of coronary artery stent placement Stent placed to RCA 2, left circumflex 1  Plavix , no aspirin, cholesterol at goal no anginal symptoms Non-smoker, no diabetes    Orders Placed This Encounter  Procedures   EKG 12-Lead     Signed, Juanda Noon, M.D., Ph.D. 08/17/2023  [facility] [place], [place] [phone]

## 2023-08-17 ENCOUNTER — Encounter: Payer: Self-pay | Admitting: Cardiovascular Disease

## 2023-08-17 ENCOUNTER — Ambulatory Visit: Attending: Cardiovascular Disease | Admitting: Cardiovascular Disease

## 2023-08-17 ENCOUNTER — Other Ambulatory Visit

## 2023-08-17 VITALS — BP 130/70 | HR 58 | Ht 74.0 in | Wt 224.1 lb

## 2023-08-17 DIAGNOSIS — I25118 Atherosclerotic heart disease of native coronary artery with other forms of angina pectoris: Secondary | ICD-10-CM

## 2023-08-17 DIAGNOSIS — I10 Essential (primary) hypertension: Secondary | ICD-10-CM | POA: Diagnosis not present

## 2023-08-17 DIAGNOSIS — R972 Elevated prostate specific antigen [PSA]: Secondary | ICD-10-CM | POA: Diagnosis not present

## 2023-08-17 DIAGNOSIS — I472 Ventricular tachycardia, unspecified: Secondary | ICD-10-CM

## 2023-08-17 DIAGNOSIS — E782 Mixed hyperlipidemia: Secondary | ICD-10-CM | POA: Diagnosis not present

## 2023-08-17 DIAGNOSIS — I493 Ventricular premature depolarization: Secondary | ICD-10-CM | POA: Diagnosis not present

## 2023-08-17 MED ORDER — ROSUVASTATIN CALCIUM 40 MG PO TABS: 40.0000 mg | ORAL_TABLET | Freq: Every day | ORAL | 3 refills | Status: AC

## 2023-08-17 MED ORDER — TELMISARTAN-HCTZ 40-12.5 MG PO TABS: 1.0000 | ORAL_TABLET | Freq: Every day | ORAL | 3 refills | Status: AC

## 2023-08-17 MED ORDER — CLOPIDOGREL BISULFATE 75 MG PO TABS: 75.0000 mg | ORAL_TABLET | Freq: Every day | ORAL | 3 refills | Status: AC

## 2023-08-17 MED ORDER — OMEPRAZOLE 20 MG PO CPDR: 20.0000 mg | DELAYED_RELEASE_CAPSULE | Freq: Every day | ORAL | 3 refills | Status: AC

## 2023-08-17 MED ORDER — EZETIMIBE 10 MG PO TABS: 10.0000 mg | ORAL_TABLET | Freq: Every day | ORAL | 3 refills | Status: AC

## 2023-08-17 NOTE — Patient Instructions (Signed)

## 2023-08-18 LAB — PSA: Prostate Specific Ag, Serum: 11.4 ng/mL — ABNORMAL HIGH (ref 0.0–4.0)

## 2023-08-19 ENCOUNTER — Ambulatory Visit: Payer: Self-pay | Admitting: Urology

## 2023-08-21 ENCOUNTER — Other Ambulatory Visit: Payer: Self-pay | Admitting: *Deleted

## 2023-08-21 DIAGNOSIS — N401 Enlarged prostate with lower urinary tract symptoms: Secondary | ICD-10-CM

## 2023-08-21 DIAGNOSIS — R972 Elevated prostate specific antigen [PSA]: Secondary | ICD-10-CM

## 2023-08-23 ENCOUNTER — Other Ambulatory Visit

## 2023-08-23 DIAGNOSIS — R972 Elevated prostate specific antigen [PSA]: Secondary | ICD-10-CM | POA: Diagnosis not present

## 2023-08-23 DIAGNOSIS — N138 Other obstructive and reflux uropathy: Secondary | ICD-10-CM | POA: Diagnosis not present

## 2023-08-23 DIAGNOSIS — N401 Enlarged prostate with lower urinary tract symptoms: Secondary | ICD-10-CM

## 2023-08-23 LAB — URINALYSIS, COMPLETE
Bilirubin, UA: NEGATIVE
Glucose, UA: NEGATIVE
Ketones, UA: NEGATIVE
Leukocytes,UA: NEGATIVE
Nitrite, UA: NEGATIVE
Protein,UA: NEGATIVE
RBC, UA: NEGATIVE
Specific Gravity, UA: 1.01 (ref 1.005–1.030)
Urobilinogen, Ur: 0.2 mg/dL (ref 0.2–1.0)
pH, UA: 6 (ref 5.0–7.5)

## 2023-08-23 LAB — MICROSCOPIC EXAMINATION: Bacteria, UA: NONE SEEN

## 2023-08-26 ENCOUNTER — Ambulatory Visit: Payer: Self-pay | Admitting: Urology

## 2023-08-26 LAB — CULTURE, URINE COMPREHENSIVE

## 2023-09-26 ENCOUNTER — Other Ambulatory Visit

## 2023-09-26 DIAGNOSIS — R972 Elevated prostate specific antigen [PSA]: Secondary | ICD-10-CM | POA: Diagnosis not present

## 2023-09-27 LAB — PSA: Prostate Specific Ag, Serum: 7.1 ng/mL — ABNORMAL HIGH (ref 0.0–4.0)

## 2023-09-29 ENCOUNTER — Ambulatory Visit: Payer: Self-pay | Admitting: Urology

## 2023-10-01 ENCOUNTER — Telehealth: Payer: Self-pay | Admitting: Urology

## 2023-10-01 ENCOUNTER — Other Ambulatory Visit: Payer: Self-pay | Admitting: *Deleted

## 2023-10-01 DIAGNOSIS — R972 Elevated prostate specific antigen [PSA]: Secondary | ICD-10-CM

## 2023-10-01 NOTE — Telephone Encounter (Signed)
-----   Message from Glendia BROCKS [facility] sent at 09/29/2023 11:10 AM EDT ----- Repeat PSA remains elevated above baseline at 7.1.  Recommend prostate MRI for further evaluation ----- Message ----- From: Interface, [facility] Lab Results In Sent: 09/27/2023   5:37 AM EDT To: Glendia BROCKS Barba, MD

## 2023-10-01 NOTE — Telephone Encounter (Signed)
 Called pt informed him of the information below. Pt voiced understanding. MRI ordered. Prep instructions sent via mychart.

## 2023-10-10 ENCOUNTER — Ambulatory Visit

## 2023-10-11 ENCOUNTER — Ambulatory Visit
Admission: RE | Admit: 2023-10-11 | Discharge: 2023-10-11 | Disposition: A | Discharge status: Home / Self Care | Attending: Emergency Medicine | Admitting: Emergency Medicine

## 2023-10-11 VITALS — BP 135/81 | HR 71 | Temp 97.7°F | Resp 16

## 2023-10-11 DIAGNOSIS — R300 Dysuria: Secondary | ICD-10-CM | POA: Diagnosis not present

## 2023-10-11 LAB — POCT URINALYSIS DIP (MANUAL ENTRY)
Bilirubin, UA: NEGATIVE
Glucose, UA: NEGATIVE mg/dL
Ketones, POC UA: NEGATIVE mg/dL
Nitrite, UA: NEGATIVE
Protein Ur, POC: NEGATIVE mg/dL
Spec Grav, UA: 1.015 (ref 1.010–1.025)
Urobilinogen, UA: 0.2 U/dL
pH, UA: 6.5 (ref 5.0–8.0)

## 2023-10-11 MED ORDER — CEFUROXIME AXETIL 500 MG PO TABS: 500.0000 mg | ORAL_TABLET | Freq: Two times a day (BID) | ORAL | 0 refills | Status: AC

## 2023-10-11 NOTE — ED Triage Notes (Signed)
 Patient to Urgent Care with complaints of urinary frequency/ dysuria that started one week ago.  Denies any fevers.

## 2023-10-11 NOTE — Discharge Instructions (Addendum)
 Take the antibiotic as directed.  The urine culture is pending.    Follow-up with your urologist.

## 2023-10-11 NOTE — ED Provider Notes (Signed)
 Kevin Moore    CSN: [id number] Arrival date & time: 10/11/23  1420      History   Chief Complaint Chief Complaint  Patient presents with   Urinary Frequency    Need urine test to see if there is bacteria causing the problem. - Entered by patient    HPI Kevin Moore is a 73 y.o. male.  Patient presents with 1 week history of dysuria and urinary frequency.  Normal urine stream.  No fever, abdominal pain, hematuria, flank pain, penile discharge, testicular pain.  No OTC medication taken today.  Patient is followed by urology for benign prostatic hyperplasia with urinary obstruction.  The history is provided by the patient and medical records.    Past Medical History:  Diagnosis Date   Acute ST elevation myocardial infarction (STEMI) of inferior wall (HCC) 10/17/2014   a.) LHC 10/17/2014: <50% LAD, 50-60% prox mid diag, 50% pLCx, 99% mid major marg, 80-90% prox 2/3s RCA - during cath, spontaneous occ of subtotal LCx caused VF (defib 200J x 2). PCI of LCx (2.5 x 12 mm Xience Alpine). Started on amio gtt (bradycardia). Started on dopamine (SOB - d/c'd). Post-cath, dev WCT/polymorphic VT. EP consulted and lidocaine  gtt started; b.) PCI of RCA (DES x2) on 10/20/2014   Beta-blocker intolerance    a.) causes profound bradycardia   BPH without urinary obstruction 03/2022   Coronary artery disease    a.) LHC 10/17/2014: <50% LAD, 50-60% prox portion of mid diagonal, 50% pLCx, 99% mid major marginal, 80-90% proximal 2/3s RCA  --> PCI performed placing a 2.5 x 12 mm Xience Alpine DES x 1 mLCx.; b.) PCI 10/20/2014: 80-90% o-mRCA --> 3.0 x 32 mm (proximal) and 2.5 x 38 mm (distal) Synergy DES placed.   GERD (gastroesophageal reflux disease)    History of colonic polyps    HLD (hyperlipidemia)    Hypertension    Long term current use of antithrombotics/antiplatelets    a.) clopidogrel    Melanoma (HCC)    Osteoarthritis    Polymorphic ventricular tachycardia (HCC) 10/17/2014    a.) post cardiac catheterization --> developed WCT/polymorphic VT. EP consulted and lidocaine  gtt started --> felt to be secondary to known RCA disease. PCI of RCA recommended; no AICD deemed necessary at that time.   Prostatitis    Ventricular fibrillation (HCC) 10/17/2014   a.) in setting of STEMI 10/17/2014 --> during cath, spontaneous occlusion of subtotal LCx caused VF requring defib 200J x 2. PCI of LCx (2.5 x 12 mm Xience Alpine DES). Started on amiodarone gtt, which caused bradycardia. Started on dopamine, which caused SOB (d/c'd).    Patient Active Problem List   Diagnosis Date Noted   Left-sided back pain 05/21/2023   Polyp of ascending colon 12/05/2022   Encounter for screening colonoscopy 12/05/2022   BPH (benign prostatic hyperplasia) 04/19/2022   Status post total knee replacement using cement, left 03/30/2022   Chronic rupture of ACL of left knee 10/21/2021   Post-traumatic osteoarthritis of left knee 10/21/2021   Melanoma of skin (HCC) 06/21/2021   Routine physical examination 06/14/2020   History of coronary artery stent placement 03/06/2016   Beat, premature ventricular 01/26/2015   Ventricular tachycardia (HCC) 01/26/2015   Ventricular premature depolarization 01/26/2015   Heart attack (HCC) 10/16/2014   Myocardial infarction (HCC) 10/16/2014   Coronary artery disease 08/25/2014   HTN (hypertension) 08/25/2014   Acid reflux 08/25/2014   HLD (hyperlipidemia) 08/25/2014    Past Surgical History:  Procedure Laterality Date  COLON SURGERY     partial removal of colon with perforation due to suspected fish bone.   COLONOSCOPY WITH PROPOFOL  N/A 12/05/2022   Procedure: COLONOSCOPY WITH PROPOFOL ;  Surgeon: Jinny Carmine, MD;  Location: [facility] ENDOSCOPY;  Service: Endoscopy;  Laterality: N/A;   CORONARY ANGIOPLASTY WITH STENT PLACEMENT  10/17/2014   Procedure: CORONARY ANGIOPLASTY WITH STENT PLACEMENT (2.5 x 12 mm Xience Alpine to mLCx); Location: [facility]   CORONARY STENT  INTERVENTION Left 10/20/2014   Procedure: CORONARY STENT INTERVENTION (3.0 x 32 mm Synergy DES pRCA, 2.5 x 38 mm Synergy DES dRCA); Location: [facility]   CYSTOSCOPY WITH INSERTION OF UROLIFT N/A 05/09/2022   Procedure: CYSTOSCOPY WITH INSERTION OF UROLIFT;  Surgeon: Twylla Glendia BROCKS, MD;  Location: [facility] ORS;  Service: Urology;  Laterality: N/A;   ESOPHAGEAL DILATION     KNEE ARTHROSCOPY Left    POLYPECTOMY  12/05/2022   Procedure: POLYPECTOMY;  Surgeon: Jinny Carmine, MD;  Location: [facility] ENDOSCOPY;  Service: Endoscopy;;   TONSILLECTOMY     TOTAL KNEE ARTHROPLASTY Left 03/28/2022   Procedure: TOTAL KNEE ARTHROPLASTY;  Surgeon: Edie Norleen PARAS, MD;  Location: [facility] ORS;  Service: Orthopedics;  Laterality: Left;       Home Medications    Prior to Admission medications   Medication Sig Start Date End Date Taking? Authorizing Provider  cefUROXime  (CEFTIN ) 500 MG tablet Take 1 tablet (500 mg total) by mouth 2 (two) times daily with a meal for 7 days. 10/11/23 10/18/23 Yes Corlis Burnard DEL, NP  Cholecalciferol 25 MCG (1000 UT) capsule Take 1,000 Units by mouth daily.    [provider]  clopidogrel  (PLAVIX ) 75 MG tablet Take 1 tablet (75 mg total) by mouth daily. 08/17/23   Gollan, Timothy J, MD  ezetimibe  (ZETIA ) 10 MG tablet Take 1 tablet (10 mg total) by mouth daily. 08/17/23   Gollan, Timothy J, MD  Magnesium 250 MG TABS Take 1 tablet by mouth daily.    [provider]  omeprazole  (PRILOSEC) 20 MG capsule Take 1 capsule (20 mg total) by mouth daily. 08/17/23   Gollan, Timothy J, MD  rosuvastatin  (CRESTOR ) 40 MG tablet Take 1 tablet (40 mg total) by mouth daily. 08/17/23   Gollan, Timothy J, MD  telmisartan -hydrochlorothiazide (MICARDIS  HCT) 40-12.5 MG tablet Take 1 tablet by mouth daily. 08/17/23   Gollan, Timothy J, MD    Family History Family History  Problem Relation Age of Onset   Hyperlipidemia Mother    Cancer Mother        Ovarian, died age 21   Heart disease Father        bypass  at age 18   Protein C deficiency Brother    Epilepsy Son     Social History Social History   Tobacco Use   Smoking status: Never    Passive exposure: Never   Smokeless tobacco: Never  Vaping Use   Vaping status: Never Used  Substance Use Topics   Alcohol use: Yes    Alcohol/week: 3.0 standard drinks of alcohol    Types: 3 Cans of beer per week    Comment: occassional beer 1 day a week   Drug use: No     Allergies   Beta adrenergic blockers   Review of Systems Review of Systems  Constitutional:  Negative for chills and fever.  Gastrointestinal:  Negative for abdominal pain.  Genitourinary:  Positive for dysuria and frequency. Negative for flank pain, hematuria, penile discharge and testicular pain.     Physical  Exam Triage Vital Signs ED Triage Vitals  Encounter Vitals Group     BP      Girls Systolic BP Percentile      Girls Diastolic BP Percentile      Boys Systolic BP Percentile      Boys Diastolic BP Percentile      Pulse      Resp      Temp      Temp src      SpO2      Weight      Height      Head Circumference      Peak Flow      Pain Score      Pain Loc      Pain Education      Exclude from Growth Chart    No data found.  Updated Vital Signs BP 135/81   Pulse 71   Temp 97.7 F (36.5 C)   Resp 16   SpO2 98%   Visual Acuity Right Eye Distance:   Left Eye Distance:   Bilateral Distance:    Right Eye Near:   Left Eye Near:    Bilateral Near:     Physical Exam Constitutional:      General: He is not in acute distress. HENT:     Mouth/Throat:     Mouth: Mucous membranes are moist.  Cardiovascular:     Rate and Rhythm: Normal rate and regular rhythm.  Pulmonary:     Effort: Pulmonary effort is normal. No respiratory distress.  Abdominal:     General: Bowel sounds are normal.     Palpations: Abdomen is soft.     Tenderness: There is no abdominal tenderness. There is no right CVA tenderness, left CVA tenderness, guarding or  rebound.  Neurological:     Mental Status: He is alert.      UC Treatments / Results  Labs (all labs ordered are listed, but only abnormal results are displayed) Labs Reviewed  POCT URINALYSIS DIP (MANUAL ENTRY) - Abnormal; Notable for the following components:      Result Value   Blood, UA trace-intact (*)    Leukocytes, UA Moderate (2+) (*)    All other components within normal limits  URINE CULTURE    EKG   Radiology No results found.  Procedures Procedures (including critical care time)  Medications Ordered in UC Medications - No data to display  Initial Impression / Assessment and Plan / UC Course  I have reviewed the triage vital signs and the nursing notes.  Pertinent labs & imaging results that were available during my care of the patient were reviewed by me and considered in my medical decision making (see chart for details).    Dysuria.  Afebrile and vital signs are stable.  Urine culture pending.  Treating today with cefuroxime .  Education provided on dysuria.  Instructed patient to follow-up with his urologist, Dr. Twylla.  He agrees to plan of care.  Final Clinical Impressions(s) / UC Diagnoses   Final diagnoses:  Dysuria     Discharge Instructions      Take the antibiotic as directed.  The urine culture is pending.    Follow-up with your urologist.      ED Prescriptions     Medication Sig Dispense Auth. Provider   cefUROXime  (CEFTIN ) 500 MG tablet Take 1 tablet (500 mg total) by mouth 2 (two) times daily with a meal for 7 days. 14 tablet Corlis Burnard DEL, NP  PDMP not reviewed this encounter.   Corlis Burnard DEL, NP 10/11/23 1510

## 2023-10-13 LAB — URINE CULTURE: Culture: >=100000 — AB

## 2023-10-15 ENCOUNTER — Ambulatory Visit (HOSPITAL_COMMUNITY): Payer: Self-pay

## 2023-10-22 ENCOUNTER — Telehealth: Payer: Self-pay

## 2023-10-22 DIAGNOSIS — Z0000 Encounter for general adult medical examination without abnormal findings: Secondary | ICD-10-CM

## 2023-10-22 NOTE — Telephone Encounter (Signed)
 I spoke with patient and scheduled an appointment for him to have his physical with Rollene Northern, FNP, on 11/22/2023.  Patient states he would like to have his labs drawn prior to his visit.  I let him know that we do not have lab orders in the system, so I will send a message to Rollene Northern, FNP.

## 2023-10-23 NOTE — Telephone Encounter (Signed)
 Order A1c, cbc with diff, tsh and cmp, lipid  Please sch

## 2023-10-23 NOTE — Addendum Note (Signed)
 Addended by: Paiden Caraveo on: 10/23/2023 08:56 AM   Modules accepted: Orders

## 2023-10-23 NOTE — Telephone Encounter (Signed)
 LVM to inform pt that labs orders have been placed, please schedule when pt calls back

## 2023-10-24 NOTE — Telephone Encounter (Signed)
 Pt has been scheduled.

## 2023-10-25 ENCOUNTER — Ambulatory Visit
Admission: RE | Admit: 2023-10-25 | Discharge: 2023-10-25 | Disposition: A | Discharge status: Home / Self Care | Source: Ambulatory Visit | Attending: Urology | Admitting: Urology

## 2023-10-25 DIAGNOSIS — R972 Elevated prostate specific antigen [PSA]: Secondary | ICD-10-CM | POA: Diagnosis not present

## 2023-10-25 DIAGNOSIS — N3289 Other specified disorders of bladder: Secondary | ICD-10-CM | POA: Diagnosis not present

## 2023-10-25 MED ORDER — GADOBUTROL 1 MMOL/ML IV SOLN
10.0000 mL | Freq: Once | INTRAVENOUS | Status: AC | PRN
  Administered 2023-10-25: 10 mL via INTRAVENOUS

## 2023-11-04 ENCOUNTER — Ambulatory Visit: Payer: Self-pay | Admitting: Urology

## 2023-11-08 ENCOUNTER — Ambulatory Visit
Admission: RE | Admit: 2023-11-08 | Discharge: 2023-11-08 | Disposition: A | Discharge status: Home / Self Care | Payer: Self-pay | Attending: Emergency Medicine | Admitting: Emergency Medicine

## 2023-11-08 VITALS — BP 146/89 | HR 60 | Temp 97.8°F | Resp 18

## 2023-11-08 DIAGNOSIS — R300 Dysuria: Secondary | ICD-10-CM | POA: Insufficient documentation

## 2023-11-08 LAB — POCT URINE DIPSTICK
Bilirubin, UA: NEGATIVE
Blood, UA: NEGATIVE
Glucose, UA: NEGATIVE mg/dL
Ketones, POC UA: NEGATIVE mg/dL
Nitrite, UA: NEGATIVE
POC PROTEIN,UA: NEGATIVE
Spec Grav, UA: 1.01 (ref 1.010–1.025)
Urobilinogen, UA: 0.2 U/dL
pH, UA: 6.5 (ref 5.0–8.0)

## 2023-11-08 MED ORDER — NITROFURANTOIN MONOHYD MACRO 100 MG PO CAPS: 100.0000 mg | ORAL_CAPSULE | Freq: Two times a day (BID) | ORAL | 0 refills | Status: AC

## 2023-11-08 NOTE — Discharge Instructions (Addendum)
Take the antibiotic as directed.  The urine culture is pending.  We will call you if it shows the need to change or discontinue your antibiotic.    Follow up with your primary care provider.    

## 2023-11-08 NOTE — ED Triage Notes (Signed)
 Patient to Urgent Care with complaints of urinary frequency/ dysuria. Denies any fevers. Body aches yesterday afternoon.   Treated for a UTI 7/17. Completed course of antibiotics. Started feeling worse yesterday. Doesn't feel like symptoms fully resolved.

## 2023-11-08 NOTE — ED Provider Notes (Signed)
 Kevin Moore    CSN: [id number] Arrival date & time: 11/08/23  0855      History   Chief Complaint Chief Complaint  Patient presents with   Urinary Frequency    I think I should have taken the antibiotic that matched my last bacteria. I don't think we got it all. - Entered by patient    HPI Kevin Moore is a 73 y.o. male.  Patient presents with 1 day history of dysuria, urinary frequency, body aches.  No fever, abdominal pain, flank pain, hematuria, difficulty with urine stream.  His medical history includes benign prostatic hyperplasia.  He was seen here on 10/11/2023 for dysuria; treated with cefuroxime ; urine culture positive for Enterococcus faecalis.  He reports his symptoms resolved with this treatment until he developed his current symptoms yesterday.  He is followed by urology.  The history is provided by the patient and medical records.    Past Medical History:  Diagnosis Date   Acute ST elevation myocardial infarction (STEMI) of inferior wall (HCC) 10/17/2014   a.) LHC 10/17/2014: <50% LAD, 50-60% prox mid diag, 50% pLCx, 99% mid major marg, 80-90% prox 2/3s RCA - during cath, spontaneous occ of subtotal LCx caused VF (defib 200J x 2). PCI of LCx (2.5 x 12 mm Xience Alpine). Started on amio gtt (bradycardia). Started on dopamine (SOB - d/c'd). Post-cath, dev WCT/polymorphic VT. EP consulted and lidocaine  gtt started; b.) PCI of RCA (DES x2) on 10/20/2014   Beta-blocker intolerance    a.) causes profound bradycardia   BPH without urinary obstruction 03/2022   Coronary artery disease    a.) LHC 10/17/2014: <50% LAD, 50-60% prox portion of mid diagonal, 50% pLCx, 99% mid major marginal, 80-90% proximal 2/3s RCA  --> PCI performed placing a 2.5 x 12 mm Xience Alpine DES x 1 mLCx.; b.) PCI 10/20/2014: 80-90% o-mRCA --> 3.0 x 32 mm (proximal) and 2.5 x 38 mm (distal) Synergy DES placed.   GERD (gastroesophageal reflux disease)    History of colonic polyps    HLD  (hyperlipidemia)    Hypertension    Long term current use of antithrombotics/antiplatelets    a.) clopidogrel    Melanoma (HCC)    Osteoarthritis    Polymorphic ventricular tachycardia (HCC) 10/17/2014   a.) post cardiac catheterization --> developed WCT/polymorphic VT. EP consulted and lidocaine  gtt started --> felt to be secondary to known RCA disease. PCI of RCA recommended; no AICD deemed necessary at that time.   Prostatitis    Ventricular fibrillation (HCC) 10/17/2014   a.) in setting of STEMI 10/17/2014 --> during cath, spontaneous occlusion of subtotal LCx caused VF requring defib 200J x 2. PCI of LCx (2.5 x 12 mm Xience Alpine DES). Started on amiodarone gtt, which caused bradycardia. Started on dopamine, which caused SOB (d/c'd).    Patient Active Problem List   Diagnosis Date Noted   Left-sided back pain 05/21/2023   Polyp of ascending colon 12/05/2022   Encounter for screening colonoscopy 12/05/2022   BPH (benign prostatic hyperplasia) 04/19/2022   Status post total knee replacement using cement, left 03/30/2022   Chronic rupture of ACL of left knee 10/21/2021   Post-traumatic osteoarthritis of left knee 10/21/2021   Melanoma of skin (HCC) 06/21/2021   Routine physical examination 06/14/2020   History of coronary artery stent placement 03/06/2016   Beat, premature ventricular 01/26/2015   Ventricular tachycardia (HCC) 01/26/2015   Ventricular premature depolarization 01/26/2015   Heart attack (HCC) 10/16/2014   Myocardial infarction (  HCC) 10/16/2014   Coronary artery disease 08/25/2014   HTN (hypertension) 08/25/2014   Acid reflux 08/25/2014   HLD (hyperlipidemia) 08/25/2014    Past Surgical History:  Procedure Laterality Date   COLON SURGERY     partial removal of colon with perforation due to suspected fish bone.   COLONOSCOPY WITH PROPOFOL  N/A 12/05/2022   Procedure: COLONOSCOPY WITH PROPOFOL ;  Surgeon: Jinny Carmine, MD;  Location: [facility] ENDOSCOPY;  Service:  Endoscopy;  Laterality: N/A;   CORONARY ANGIOPLASTY WITH STENT PLACEMENT  10/17/2014   Procedure: CORONARY ANGIOPLASTY WITH STENT PLACEMENT (2.5 x 12 mm Xience Alpine to mLCx); Location: [facility]   CORONARY STENT INTERVENTION Left 10/20/2014   Procedure: CORONARY STENT INTERVENTION (3.0 x 32 mm Synergy DES pRCA, 2.5 x 38 mm Synergy DES dRCA); Location: [facility]   CYSTOSCOPY WITH INSERTION OF UROLIFT N/A 05/09/2022   Procedure: CYSTOSCOPY WITH INSERTION OF UROLIFT;  Surgeon: Twylla Glendia BROCKS, MD;  Location: [facility] ORS;  Service: Urology;  Laterality: N/A;   ESOPHAGEAL DILATION     KNEE ARTHROSCOPY Left    POLYPECTOMY  12/05/2022   Procedure: POLYPECTOMY;  Surgeon: Jinny Carmine, MD;  Location: [facility] ENDOSCOPY;  Service: Endoscopy;;   TONSILLECTOMY     TOTAL KNEE ARTHROPLASTY Left 03/28/2022   Procedure: TOTAL KNEE ARTHROPLASTY;  Surgeon: Edie Norleen PARAS, MD;  Location: [facility] ORS;  Service: Orthopedics;  Laterality: Left;       Home Medications    Prior to Admission medications   Medication Sig Start Date End Date Taking? Authorizing Provider  nitrofurantoin , macrocrystal-monohydrate, (MACROBID ) 100 MG capsule Take 1 capsule (100 mg total) by mouth 2 (two) times daily. 11/08/23  Yes Corlis Burnard DEL, NP  Cholecalciferol 25 MCG (1000 UT) capsule Take 1,000 Units by mouth daily.    [provider]  clopidogrel  (PLAVIX ) 75 MG tablet Take 1 tablet (75 mg total) by mouth daily. 08/17/23   Gollan, Timothy J, MD  ezetimibe  (ZETIA ) 10 MG tablet Take 1 tablet (10 mg total) by mouth daily. 08/17/23   Gollan, Timothy J, MD  Magnesium 250 MG TABS Take 1 tablet by mouth daily.    [provider]  omeprazole  (PRILOSEC) 20 MG capsule Take 1 capsule (20 mg total) by mouth daily. 08/17/23   Gollan, Timothy J, MD  rosuvastatin  (CRESTOR ) 40 MG tablet Take 1 tablet (40 mg total) by mouth daily. 08/17/23   Gollan, Timothy J, MD  telmisartan -hydrochlorothiazide (MICARDIS  HCT) 40-12.5 MG tablet Take 1 tablet by mouth  daily. 08/17/23   Gollan, Timothy J, MD    Family History Family History  Problem Relation Age of Onset   Hyperlipidemia Mother    Cancer Mother        Ovarian, died age 22   Heart disease Father        bypass at age 54   Protein C deficiency Brother    Epilepsy Son     Social History Social History   Tobacco Use   Smoking status: Never    Passive exposure: Never   Smokeless tobacco: Never  Vaping Use   Vaping status: Never Used  Substance Use Topics   Alcohol use: Yes    Alcohol/week: 3.0 standard drinks of alcohol    Types: 3 Cans of beer per week    Comment: occassional beer 1 day a week   Drug use: No     Allergies   Beta adrenergic blockers   Review of Systems Review of Systems  Constitutional:  Negative for chills and fever.  Gastrointestinal:  Negative for abdominal pain.  Genitourinary:  Positive for dysuria and frequency. Negative for difficulty urinating, flank pain and hematuria.     Physical Exam Triage Vital Signs ED Triage Vitals [11/08/23 0932]  Encounter Vitals Group     BP (!) 146/89     Girls Systolic BP Percentile      Girls Diastolic BP Percentile      Boys Systolic BP Percentile      Boys Diastolic BP Percentile      Pulse Rate 60     Resp 18     Temp 97.8 F (36.6 C)     Temp src      SpO2 99 %     Weight      Height      Head Circumference      Peak Flow      Pain Score      Pain Loc      Pain Education      Exclude from Growth Chart    No data found.  Updated Vital Signs BP (!) 146/89   Pulse 60   Temp 97.8 F (36.6 C)   Resp 18   SpO2 99%   Visual Acuity Right Eye Distance:   Left Eye Distance:   Bilateral Distance:    Right Eye Near:   Left Eye Near:    Bilateral Near:     Physical Exam Constitutional:      General: He is not in acute distress. HENT:     Mouth/Throat:     Mouth: Mucous membranes are moist.  Cardiovascular:     Rate and Rhythm: Normal rate and regular rhythm.  Pulmonary:      Effort: Pulmonary effort is normal. No respiratory distress.  Abdominal:     General: Bowel sounds are normal.     Palpations: Abdomen is soft.     Tenderness: There is no abdominal tenderness. There is no right CVA tenderness, left CVA tenderness, guarding or rebound.  Neurological:     Mental Status: He is alert.      UC Treatments / Results  Labs (all labs ordered are listed, but only abnormal results are displayed) Labs Reviewed  POCT URINE DIPSTICK - Abnormal; Notable for the following components:      Result Value   Leukocytes, UA Small (1+) (*)    All other components within normal limits  URINE CULTURE    EKG   Radiology No results found.  Procedures Procedures (including critical care time)  Medications Ordered in UC Medications - No data to display  Initial Impression / Assessment and Plan / UC Course  I have reviewed the triage vital signs and the nursing notes.  Pertinent labs & imaging results that were available during my care of the patient were reviewed by me and considered in my medical decision making (see chart for details).    Dysuria.  Urine culture pending.  Based on most recent urine culture, treating today with Macrobid .  Education provided on dysuria.  Instructed patient to follow-up with his PCP or urologist.  He agrees to plan of care.  Final Clinical Impressions(s) / UC Diagnoses   Final diagnoses:  Dysuria     Discharge Instructions      Take the antibiotic as directed.  The urine culture is pending.  We will call you if it shows the need to change or discontinue your antibiotic.    Follow up with your primary care provider.  ED Prescriptions     Medication Sig Dispense Auth. Provider   nitrofurantoin , macrocrystal-monohydrate, (MACROBID ) 100 MG capsule Take 1 capsule (100 mg total) by mouth 2 (two) times daily. 10 capsule Corlis Burnard DEL, NP      PDMP not reviewed this encounter.   Corlis Burnard DEL, NP 11/08/23  1020

## 2023-11-10 LAB — URINE CULTURE: Culture: >=100000 — AB

## 2023-11-12 ENCOUNTER — Ambulatory Visit (HOSPITAL_COMMUNITY): Payer: Self-pay

## 2023-11-14 ENCOUNTER — Ambulatory Visit (INDEPENDENT_AMBULATORY_CARE_PROVIDER_SITE_OTHER): Admitting: *Deleted

## 2023-11-14 VITALS — Ht 74.0 in | Wt 222.0 lb

## 2023-11-14 DIAGNOSIS — Z0000 Encounter for general adult medical examination without abnormal findings: Secondary | ICD-10-CM | POA: Diagnosis not present

## 2023-11-14 NOTE — Progress Notes (Signed)
 Subjective:   Kevin Moore is a 73 y.o. who presents for a Medicare Wellness preventive visit.  As a reminder, Annual Wellness Visits don't include a physical exam, and some assessments may be limited, especially if this visit is performed virtually. We may recommend an in-person follow-up visit with your provider if needed.  Visit Complete: Virtual I connected with  Kevin Moore on 11/14/23 by a audio enabled telemedicine application and verified that I am speaking with the correct person using two identifiers.  Patient Location: work (in his truck)  Provider Location: Home Office  I discussed the limitations of evaluation and management by telemedicine. The patient expressed understanding and agreed to proceed.  Vital Signs: Because this visit was a virtual/telehealth visit, some criteria may be missing or patient reported. Any vitals not documented were not able to be obtained and vitals that have been documented are patient reported.  VideoDeclined- This patient declined Librarian, academic. Therefore the visit was completed with audio only.  Persons Participating in Visit: Patient.  AWV Questionnaire: No: Patient Medicare AWV questionnaire was not completed prior to this visit.  Cardiac Risk Factors include: advanced age (>43men, >61 women);male gender;hypertension;dyslipidemia;Other (see comment), Risk factor comments: CAD     Objective:    Today's Vitals   11/14/23 0811  Weight: 222 lb (100.7 kg)  Height: 6' 2 (1.88 m)   Body mass index is 28.5 kg/m.     11/14/2023    8:23 AM 11/08/2023    9:31 AM 10/11/2023    2:24 PM 12/05/2022    8:34 AM 11/01/2022    8:26 AM 05/01/2022    1:32 PM 04/07/2022    2:14 PM  Advanced Directives  Does Patient Have a Medical Advance Directive? Yes No No Yes Yes Yes Yes  Type of Estate agent of [place];Living will   Healthcare Power of [place];Living will Healthcare Power of  [place];Living will Healthcare Power of [place];Living will Healthcare Power of [place];Living will  Does patient want to make changes to medical advance directive?     No - Patient declined    Copy of Healthcare Power of Attorney in Chart? No - copy requested    No - copy requested No - copy requested     Current Medications (verified) Outpatient Encounter Medications as of 11/14/2023  Medication Sig   Cholecalciferol 25 MCG (1000 UT) capsule Take 1,000 Units by mouth daily.   clopidogrel  (PLAVIX ) 75 MG tablet Take 1 tablet (75 mg total) by mouth daily.   ezetimibe  (ZETIA ) 10 MG tablet Take 1 tablet (10 mg total) by mouth daily.   Magnesium 250 MG TABS Take 1 tablet by mouth daily.   omeprazole  (PRILOSEC) 20 MG capsule Take 1 capsule (20 mg total) by mouth daily.   rosuvastatin  (CRESTOR ) 40 MG tablet Take 1 tablet (40 mg total) by mouth daily.   telmisartan -hydrochlorothiazide (MICARDIS  HCT) 40-12.5 MG tablet Take 1 tablet by mouth daily.   nitrofurantoin , macrocrystal-monohydrate, (MACROBID ) 100 MG capsule Take 1 capsule (100 mg total) by mouth 2 (two) times daily. (Patient not taking: Reported on 11/14/2023)   No facility-administered encounter medications on file as of 11/14/2023.    Allergies (verified) Beta adrenergic blockers   History: Past Medical History:  Diagnosis Date   Acute ST elevation myocardial infarction (STEMI) of inferior wall (HCC) 10/17/2014   a.) LHC 10/17/2014: <50% LAD, 50-60% prox mid diag, 50% pLCx, 99% mid major marg, 80-90% prox 2/3s RCA - during cath, spontaneous  occ of subtotal LCx caused VF (defib 200J x 2). PCI of LCx (2.5 x 12 mm Xience Alpine). Started on amio gtt (bradycardia). Started on dopamine (SOB - d/c'd). Post-cath, dev WCT/polymorphic VT. EP consulted and lidocaine  gtt started; b.) PCI of RCA (DES x2) on 10/20/2014   Beta-blocker intolerance    a.) causes profound bradycardia   BPH without urinary obstruction 03/2022   Coronary artery  disease    a.) LHC 10/17/2014: <50% LAD, 50-60% prox portion of mid diagonal, 50% pLCx, 99% mid major marginal, 80-90% proximal 2/3s RCA  --> PCI performed placing a 2.5 x 12 mm Xience Alpine DES x 1 mLCx.; b.) PCI 10/20/2014: 80-90% o-mRCA --> 3.0 x 32 mm (proximal) and 2.5 x 38 mm (distal) Synergy DES placed.   GERD (gastroesophageal reflux disease)    History of colonic polyps    HLD (hyperlipidemia)    Hypertension    Long term current use of antithrombotics/antiplatelets    a.) clopidogrel    Melanoma (HCC)    Osteoarthritis    Polymorphic ventricular tachycardia (HCC) 10/17/2014   a.) post cardiac catheterization --> developed WCT/polymorphic VT. EP consulted and lidocaine  gtt started --> felt to be secondary to known RCA disease. PCI of RCA recommended; no AICD deemed necessary at that time.   Prostatitis    Ventricular fibrillation (HCC) 10/17/2014   a.) in setting of STEMI 10/17/2014 --> during cath, spontaneous occlusion of subtotal LCx caused VF requring defib 200J x 2. PCI of LCx (2.5 x 12 mm Xience Alpine DES). Started on amiodarone gtt, which caused bradycardia. Started on dopamine, which caused SOB (d/c'd).   Past Surgical History:  Procedure Laterality Date   COLON SURGERY     partial removal of colon with perforation due to suspected fish bone.   COLONOSCOPY WITH PROPOFOL  N/A 12/05/2022   Procedure: COLONOSCOPY WITH PROPOFOL ;  Surgeon: Jinny Carmine, MD;  Location: [facility] END[facility]COPY;  Service: Endoscopy;  Laterality: N/A;   CORONARY ANGIOPLASTY WITH STENT PLACEMENT  10/17/2014   Procedure: CORONARY ANGIOPLASTY WITH STENT PLACEMENT (2.5 x 12 mm Xience Alpine to mLCx); Location: [facility]   CORONARY STENT INTERVENTION Left 10/20/2014   Procedure: CORONARY STENT INTERVENTION (3.0 x 32 mm Synergy DES pRCA, 2.5 x 38 mm Synergy DES dRCA); Location: [facility]   CYSTOSCOPY WITH INSERTION OF UROLIFT N/A 05/09/2022   Procedure: CYSTOSCOPY WITH INSERTION OF UROLIFT;  Surgeon: Twylla Glendia BROCKS, MD;   Location: [facility] ORS;  Service: Urology;  Laterality: N/A;   ESOPHAGEAL DILATION     KNEE ARTHROSCOPY Left    POLYPECTOMY  12/05/2022   Procedure: POLYPECTOMY;  Surgeon: Jinny Carmine, MD;  Location: [facility] ENDOSCOPY;  Service: Endoscopy;;   skin cancer removed Left 2025   left arm   TONSILLECTOMY     TOTAL KNEE ARTHROPLASTY Left 03/28/2022   Procedure: TOTAL KNEE ARTHROPLASTY;  Surgeon: Edie Norleen PARAS, MD;  Location: [facility] ORS;  Service: Orthopedics;  Laterality: Left;   Family History  Problem Relation Age of Onset   Hyperlipidemia Mother    Cancer Mother        Ovarian, died age 51   Heart disease Father        bypass at age 67   Protein C deficiency Brother    Epilepsy Son    Social History   Socioeconomic History   Marital status: Married    Spouse name: RHONDA   Number of children: 2   Years of education: Not on file   Highest education level: Not on file  Occupational History   Not on file  Tobacco Use   Smoking status: Never    Passive exposure: Never   Smokeless tobacco: Never  Vaping Use   Vaping status: Never Used  Substance and Sexual Activity   Alcohol use: Yes    Alcohol/week: 3.0 standard drinks of alcohol    Types: 3 Cans of beer per week    Comment: occassional beer 1 day a week   Drug use: No   Sexual activity: Yes  Other Topics Concern   Not on file  Social History Narrative   Married to [place]. Still works full time in the Agricultural consultant. Stays active at work.   Social Drivers of Corporate investment banker Strain: Low Risk  (11/14/2023)   Overall Financial Resource Strain (CARDIA)    Difficulty of Paying Living Expenses: Not hard at all  Food Insecurity: No Food Insecurity (11/14/2023)   Hunger Vital Sign    Worried About Running Out of Food in the Last Year: Never true    Ran Out of Food in the Last Year: Never true  Transportation Needs: No Transportation Needs (11/14/2023)   PRAPARE - Administrator, Civil Service  (Medical): No    Lack of Transportation (Non-Medical): No  Physical Activity: Inactive (11/14/2023)   Exercise Vital Sign    Days of Exercise per Week: 0 days    Minutes of Exercise per Session: 0 min  Stress: No Stress Concern Present (11/14/2023)   [employer] of Occupational Health - Occupational Stress Questionnaire    Feeling of Stress: Not at all  Social Connections: Moderately Integrated (11/14/2023)   Social Connection and Isolation Panel    Frequency of Communication with Friends and Family: More than three times a week    Frequency of Social Gatherings with Friends and Family: Three times a week    Attends Religious Services: More than 4 times per year    Active Member of Clubs or Organizations: No    Attends Banker Meetings: Never    Marital Status: Married    Tobacco Counseling Counseling given: Not Answered    Clinical Intake:  Pre-visit preparation completed: Yes  Pain : No/denies pain     BMI - recorded: 28.5 Nutritional Status: BMI 25 -29 Overweight Nutritional Risks: None Diabetes: No  Lab Results  Component Value Date   HGBA1C 5.6 06/11/2020   HGBA1C 5.3 05/10/2016     How often do you need to have someone help you when you read instructions, pamphlets, or other written materials from your doctor or pharmacy?: 1 - Never  Interpreter Needed?: No  Information entered by :: R. Ady Heimann LPN   Activities of Daily Living     11/14/2023    8:12 AM  In your present state of health, do you have any difficulty performing the following activities:  Hearing? 0  Vision? 0  Difficulty concentrating or making decisions? 0  Walking or climbing stairs? 0  Dressing or bathing? 0  Doing errands, shopping? 0  Preparing Food and eating ? N  Using the Toilet? N  In the past six months, have you accidently leaked urine? N  Do you have problems with loss of bowel control? N  Managing your Medications? N  Managing your Finances? N  Housekeeping  or managing your Housekeeping? N    Patient Care Team: Dineen Rollene MATSU, FNP as PCP - General (Family Medicine) Perla Evalene PARAS, MD as PCP - Cardiology (Cardiology) Jinny Carmine,  MD as Consulting Physician (Gastroenterology) Twylla Glendia BROCKS, MD (Urology) Dasher, Alm LABOR, MD (Dermatology)  I have updated your Care Teams any recent Medical Services you may have received from other providers in the past year.     Assessment:   This is a routine wellness examination for [facility].  Hearing/Vision screen Hearing Screening - Comments:: No issues Vision Screening - Comments:: glasses   Goals Addressed             This Visit's Progress    Patient Stated       Wants to retire soon       Depression Screen     11/14/2023    8:19 AM 05/21/2023    7:57 AM 11/01/2022    8:24 AM 10/17/2022    9:02 AM 04/18/2022    9:33 AM 07/15/2020    9:37 AM 06/14/2020    2:59 PM  PHQ 2/9 Scores  PHQ - 2 Score 0 0 0 0 0 0 0  PHQ- 9 Score 0  0        Fall Risk     11/14/2023    8:15 AM 05/21/2023    7:57 AM 11/01/2022    8:27 AM 10/17/2022    9:01 AM 04/18/2022    9:33 AM  Fall Risk   Falls in the past year? 0 0 0 0 0  Number falls in past yr: 0 0 0 0 0  Injury with Fall? 0 0 0 0 0  Risk for fall due to : No Fall Risks No Fall Risks No Fall Risks No Fall Risks No Fall Risks  Follow up Falls evaluation completed;Falls prevention discussed Falls evaluation completed Falls prevention discussed Falls evaluation completed Falls evaluation completed      Data saved with a previous flowsheet row definition    MEDICARE RISK AT HOME:  Medicare Risk at Home Any stairs in or around the home?: No If so, are there any without handrails?: No Home free of loose throw rugs in walkways, pet beds, electrical cords, etc?: Yes Adequate lighting in your home to reduce risk of falls?: Yes Life alert?: No Use of a cane, walker or w/c?: No Grab bars in the bathroom?: Yes Shower chair or bench in shower?:  Yes Elevated toilet seat or a handicapped toilet?: Yes  TIMED UP AND GO:  Was the test performed?  No  Cognitive Function: 6CIT completed        11/14/2023    8:24 AM 11/01/2022    8:28 AM  6CIT Screen  What Year? 0 points 0 points  What month? 0 points 0 points  What time? 0 points 0 points  Count back from 20 0 points 0 points  Months in reverse 0 points 0 points  Repeat phrase 0 points 0 points  Total Score 0 points 0 points    Immunizations Immunization History  Administered Date(s) Administered   Fluad Quad(high Dose 65+) 04/18/2022   Fluad Trivalent(High Dose 65+) 05/21/2023   Influenza, High Dose Seasonal PF 01/22/2020, 01/10/2021   Influenza,inj,Quad PF,6+ Mos 02/25/2015, 12/17/2017   Influenza-Unspecified 01/16/2019, 01/22/2020   PFIZER(Purple Top)SARS-COV-2 Vaccination 05/07/2019, 05/28/2019, 01/22/2020   PNEUMOCOCCAL CONJUGATE-20 06/14/2020   Pneumococcal Polysaccharide-23 10/17/2018   Tdap 07/25/2011   Zoster, Live 08/21/2012    Screening Tests Health Maintenance  Topic Date Due   Zoster Vaccines- Shingrix (1 of 2) 07/02/1971   DTaP/Tdap/Td (2 - Td or Tdap) 07/24/2021   Medicare Annual Wellness (AWV)  11/01/2023   INFLUENZA VACCINE  10/26/2023   Colonoscopy  12/04/2025   Pneumococcal Vaccine: 50+ Years  Completed   Hepatitis C Screening  Completed   HPV VACCINES  Aged Out   Meningococcal B Vaccine  Aged Out   Pneumococcal Vaccine  Discontinued   COVID-19 Vaccine  Discontinued    Health Maintenance  Health Maintenance Due  Topic Date Due   Zoster Vaccines- Shingrix (1 of 2) 07/02/1971   DTaP/Tdap/Td (2 - Td or Tdap) 07/24/2021   Medicare Annual Wellness (AWV)  11/01/2023   INFLUENZA VACCINE  10/26/2023   Health Maintenance Items Addressed: Discussed the need to update shingles, tetanus and flu vaccines.   Additional Screening:  Vision Screening: Recommended annual ophthalmology exams for early detection of glaucoma and other disorders of  the eye.  Up to date Dr. Carolee Would you like a referral to an eye doctor? No    Dental Screening: Recommended annual dental exams for proper oral hygiene  Community Resource Referral / Chronic Care Management: CRR required this visit?  No   CCM required this visit?  No   Plan:    I have personally reviewed and noted the following in the patient's chart:   Medical and social history Use of alcohol, tobacco or illicit drugs  Current medications and supplements including opioid prescriptions. Patient is not currently taking opioid prescriptions. Functional ability and status Nutritional status Physical activity Advanced directives List of other physicians Hospitalizations, surgeries, and ER visits in previous 12 months Vitals Screenings to include cognitive, depression, and falls Referrals and appointments  In addition, I have reviewed and discussed with patient certain preventive protocols, quality metrics, and best practice recommendations. A written personalized care plan for preventive services as well as general preventive health recommendations were provided to patient.   Angeline Fredericks, LPN   1/79/7974   After Visit Summary: (MyChart) Due to this being a telephonic visit, the after visit summary with patients personalized plan was offered to patient via MyChart   Notes: Nothing significant to report at this time.

## 2023-11-14 NOTE — Patient Instructions (Signed)
 Kevin Moore , Thank you for taking time out of your busy schedule to complete your Annual Wellness Visit with me. I enjoyed our conversation and look forward to speaking with you again next year. I, as well as your care team,  appreciate your ongoing commitment to your health goals. Please review the following plan we discussed and let me know if I can assist you in the future. Your Game plan/ To Do List    Referrals: If you haven't heard from the office you've been referred to, please reach out to them at the phone provided.  Remember to update your shingles and tetanus vaccines at your pharmacy and get our annual flu vaccine.   Follow up Visits: We will see or speak with you next year for your Next Medicare AWV with our clinical staff 11/17/24 @ 8:10 Have you seen your provider in the last 6 months (3 months if uncontrolled diabetes)? Yes  Clinician Recommendations:  Aim for 30 minutes of exercise or brisk walking, 6-8 glasses of water , and 5 servings of fruits and vegetables each day.       This is a list of the screenings recommended for you:  Health Maintenance  Topic Date Due   Zoster (Shingles) Vaccine (1 of 2) 07/02/1971   DTaP/Tdap/Td vaccine (2 - Td or Tdap) 07/24/2021   Flu Shot  10/26/2023   Medicare Annual Wellness Visit  11/13/2024   Colon Cancer Screening  12/04/2025   Pneumococcal Vaccine for age over 35  Completed   Hepatitis C Screening  Completed   HPV Vaccine  Aged Out   Meningitis B Vaccine  Aged Out   Pneumococcal Vaccine  Discontinued   COVID-19 Vaccine  Discontinued    Advanced directives: (Copy Requested) Please bring a copy of your health care power of attorney and living will to the office to be added to your chart at your convenience. You can mail to [facility] [address] [place], [place] [address] or email to [email or web address] Advance Care Planning is important because it:  [x]  Makes sure you receive the medical care that  is consistent with your values, goals, and preferences  [x]  It provides guidance to your family and loved ones and reduces their decisional burden about whether or not they are making the right decisions based on your wishes.

## 2023-11-19 ENCOUNTER — Other Ambulatory Visit (INDEPENDENT_AMBULATORY_CARE_PROVIDER_SITE_OTHER)

## 2023-11-19 DIAGNOSIS — Z0000 Encounter for general adult medical examination without abnormal findings: Secondary | ICD-10-CM

## 2023-11-19 LAB — COMPREHENSIVE METABOLIC PANEL WITH GFR
ALT: 32 U/L (ref 0–53)
AST: 20 U/L (ref 0–37)
Albumin: 4 g/dL (ref 3.5–5.2)
Alkaline Phosphatase: 97 U/L (ref 39–117)
BUN: 13 mg/dL (ref 6–23)
CO2: 30 meq/L (ref 19–32)
Calcium: 8.9 mg/dL (ref 8.4–10.5)
Chloride: 103 meq/L (ref 96–112)
Creatinine, Ser: 0.91 mg/dL (ref 0.40–1.50)
GFR: 84.87 mL/min (ref >60.00)
Glucose, Bld: 104 mg/dL — ABNORMAL HIGH (ref 70–99)
Potassium: 3.8 meq/L (ref 3.5–5.1)
Sodium: 142 meq/L (ref 135–145)
Total Bilirubin: 0.6 mg/dL (ref 0.2–1.2)
Total Protein: 6.7 g/dL (ref 6.0–8.3)

## 2023-11-19 LAB — CBC WITH DIFFERENTIAL/PLATELET
Basophils Absolute: 0.1 K/uL (ref 0.0–0.1)
Basophils Relative: 0.8 % (ref 0.0–3.0)
Eosinophils Absolute: 0.1 K/uL (ref 0.0–0.7)
Eosinophils Relative: 1.1 % (ref 0.0–5.0)
HCT: 45.4 % (ref 39.0–52.0)
Hemoglobin: 15.1 g/dL (ref 13.0–17.0)
Lymphocytes Relative: 33.7 % (ref 12.0–46.0)
Lymphs Abs: 2.4 K/uL (ref 0.7–4.0)
MCHC: 33.3 g/dL (ref 30.0–36.0)
MCV: 89.6 fl (ref 78.0–100.0)
Monocytes Absolute: 0.5 K/uL (ref 0.1–1.0)
Monocytes Relative: 7.7 % (ref 3.0–12.0)
Neutro Abs: 4 K/uL (ref 1.4–7.7)
Neutrophils Relative %: 56.7 % (ref 43.0–77.0)
Platelets: 242 K/uL (ref 150.0–400.0)
RBC: 5.07 Mil/uL (ref 4.22–5.81)
RDW: 12.5 % (ref 11.5–15.5)
WBC: 7 K/uL (ref 4.0–10.5)

## 2023-11-19 LAB — LIPID PANEL
Cholesterol: 125 mg/dL (ref 0–200)
HDL: 34.8 mg/dL — ABNORMAL LOW (ref >39.00)
LDL Cholesterol: 68 mg/dL (ref 0–99)
NonHDL: 89.94
Total CHOL/HDL Ratio: 4
Triglycerides: 111 mg/dL (ref 0.0–149.0)
VLDL: 22.2 mg/dL (ref 0.0–40.0)

## 2023-11-19 LAB — HEMOGLOBIN A1C: Hgb A1c MFr Bld: 6.1 % (ref 4.6–6.5)

## 2023-11-19 LAB — TSH: TSH: 1.62 u[IU]/mL (ref 0.35–5.50)

## 2023-11-20 ENCOUNTER — Ambulatory Visit: Payer: Self-pay | Admitting: Family

## 2023-11-21 ENCOUNTER — Ambulatory Visit: Admitting: Family

## 2023-11-22 ENCOUNTER — Telehealth: Payer: Self-pay

## 2023-11-22 ENCOUNTER — Ambulatory Visit (INDEPENDENT_AMBULATORY_CARE_PROVIDER_SITE_OTHER): Admitting: Family

## 2023-11-22 ENCOUNTER — Encounter: Payer: Self-pay | Admitting: Family

## 2023-11-22 VITALS — BP 130/70 | HR 78 | Temp 98.4°F | Ht 73.0 in | Wt 220.4 lb

## 2023-11-22 DIAGNOSIS — Z0000 Encounter for general adult medical examination without abnormal findings: Secondary | ICD-10-CM

## 2023-11-22 DIAGNOSIS — K429 Umbilical hernia without obstruction or gangrene: Secondary | ICD-10-CM | POA: Diagnosis not present

## 2023-11-22 NOTE — Assessment & Plan Note (Signed)
 New.  Discussed alarm features which would indicate strangulation of the bowel and when to seek medical attention.  Patient politely declines consult with general surgery at this time.  Will monitor

## 2023-11-22 NOTE — Patient Instructions (Signed)
 Nice to [employer], Male Adopting a healthy lifestyle and getting preventive care are important in promoting health and wellness. Ask your health care provider about: The right schedule for you to have regular tests and exams. Things you can do on your own to prevent diseases and keep yourself healthy. What should I know about diet, weight, and exercise? Eat a healthy diet  Eat a diet that includes plenty of vegetables, fruits, low-fat dairy products, and lean protein. Do not eat a lot of foods that are high in solid fats, added sugars, or sodium. Maintain a healthy weight Body mass index (BMI) is a measurement that can be used to identify possible weight problems. It estimates body fat based on height and weight. Your health care provider can help determine your BMI and help you achieve or maintain a healthy weight. Get regular exercise Get regular exercise. This is one of the most important things you can do for your health. Most adults should: Exercise for at least 150 minutes each week. The exercise should increase your heart rate and make you sweat (moderate-intensity exercise). Do strengthening exercises at least twice a week. This is in addition to the moderate-intensity exercise. Spend less time sitting. Even light physical activity can be beneficial. Watch cholesterol and blood lipids Have your blood tested for lipids and cholesterol at 71 years of age, then have this test every 5 years. You may need to have your cholesterol levels checked more often if: Your lipid or cholesterol levels are high. You are older than 71 years of age. You are at high risk for heart disease. What should I know about cancer screening? Many types of cancers can be detected early and may often be prevented. Depending on your health history and family history, you may need to have cancer screening at various ages. This may include screening for: Colorectal cancer. Prostate cancer. Skin  cancer. Lung cancer. What should I know about heart disease, diabetes, and high blood pressure? Blood pressure and heart disease High blood pressure causes heart disease and increases the risk of stroke. This is more likely to develop in people who have high blood pressure readings or are overweight. Talk with your health care provider about your target blood pressure readings. Have your blood pressure checked: Every 3-5 years if you are 9-24 years of age. Every year if you are 32 years old or older. If you are between the ages of 46 and 63 and are a current or former smoker, ask your health care provider if you should have a one-time screening for abdominal aortic aneurysm (AAA). Diabetes Have regular diabetes screenings. This checks your fasting blood sugar level. Have the screening done: Once every three years after age 91 if you are at a normal weight and have a low risk for diabetes. More often and at a younger age if you are overweight or have a high risk for diabetes. What should I know about preventing infection? Hepatitis B If you have a higher risk for hepatitis B, you should be screened for this virus. Talk with your health care provider to find out if you are at risk for hepatitis B infection. Hepatitis C Blood testing is recommended for: Everyone born from [birth year] through 1963. Anyone with known risk factors for hepatitis C. Sexually transmitted infections (STIs) You should be screened each year for STIs, including gonorrhea and chlamydia, if: You are sexually active and are younger than 71 years of age. You are older than 71 years of age  and your health care provider tells you that you are at risk for this type of infection. Your sexual activity has changed since you were last screened, and you are at increased risk for chlamydia or gonorrhea. Ask your health care provider if you are at risk. Ask your health care provider about whether you are at high risk for HIV. Your health  care provider may recommend a prescription medicine to help prevent HIV infection. If you choose to take medicine to prevent HIV, you should first get tested for HIV. You should then be tested every 3 months for as long as you are taking the medicine. Follow these instructions at home: Alcohol use Do not drink alcohol if your health care provider tells you not to drink. If you drink alcohol: Limit how much you have to 0-2 drinks a day. Know how much alcohol is in your drink. In the [place] one drink equals one 12 oz bottle of beer (355 mL), one 5 oz glass of wine (148 mL), or one 1 oz glass of hard liquor (44 mL). Lifestyle Do not use any products that contain nicotine or tobacco. These products include cigarettes, chewing tobacco, and vaping devices, such as e-cigarettes. If you need help quitting, ask your health care provider. Do not use street drugs. Do not share needles. Ask your health care provider for help if you need support or information about quitting drugs. General instructions Schedule regular health, dental, and eye exams. Stay current with your vaccines. Tell your health care provider if: You often feel depressed. You have ever been abused or do not feel safe at home. Summary Adopting a healthy lifestyle and getting preventive care are important in promoting health and wellness. Follow your health care provider's instructions about healthy diet, exercising, and getting tested or screened for diseases. Follow your health care provider's instructions on monitoring your cholesterol and blood pressure. This information is not intended to replace advice given to you by your health care provider. Make sure you discuss any questions you have with your health care provider. Document Revised: 08/02/2020 Document Reviewed: 08/02/2020 Elsevier Patient Education  2024 [email or web address]

## 2023-11-22 NOTE — Addendum Note (Signed)
 Addended by: Bernece Gall on: 11/22/2023 03:21 PM   Modules accepted: Orders

## 2023-11-22 NOTE — Telephone Encounter (Signed)
 Labs have been ordered and pt has been notified.

## 2023-11-22 NOTE — Telephone Encounter (Signed)
 Patient states at check-out that he would like to schedule his physical for next year and he would like to have labs prior to his visit.  I scheduled an appointment for patient to have his physical on 11/25/2024.  I let patient know that we do not have orders for his labs, so I will send a message to Rollene Northern, FNP, to see if she would be willing to put in lab orders so we may schedule his lab visit.

## 2023-11-22 NOTE — Progress Notes (Signed)
 Assessment & Plan:  Umbilical hernia without obstruction and without gangrene Assessment & Plan: New.  Discussed alarm features which would indicate strangulation of the bowel and when to seek medical attention.  Patient politely declines consult with general surgery at this time.  Will monitor   Routine physical examination Assessment & Plan: Advised patient to have Shingrix, Tdap vaccine at local pharmacy.  Congratulated patient on diligence to exercise.  He will continue close follow-up with dermatology, urology.      Return precautions given.   Risks, benefits, and alternatives of the medications and treatment plan prescribed today were discussed, and patient expressed understanding.   Education regarding symptom management and diagnosis given to patient on AVS either electronically or printed.  No follow-ups on file.  Rollene Northern, FNP  Subjective:    Patient ID: Kevin Moore, male    DOB: 1951/03/08, 73 y.o.   MRN: [id number]  CC: Kevin Moore is a 73 y.o. male who presents today for physical exam.    HPI: Feels well today.  Denies chest pain, fatigue, shortness of breath.    He did notice recently what he thinks to be is a hernia inside of his bellybutton.  Nontender.  Denies abdominal pain   H/o melanoma; continues to follow with Dr. Dela  Colorectal  Cancer Screening: UTD , 12/05/22 , Dr Jinny , repeat in 3  years  Prostate Cancer Screening: Following with Dr Twylla; PSA 7.1 09/26/23; MRI prostate 11/04/23; not recommended to have prostate biopsy   Lung Cancer Screening: No 30 year pack year history and > 50 years to 80 years.  Immunizations       Tetanus - due         Exercise: Gets regular exercise through work, house and yard work.   Alcohol use:  occassional Smoking/tobacco use: Nonsmoker.    Health Maintenance  Topic Date Due   DTaP/Tdap/Td vaccine (2 - Td or Tdap) 07/24/2021   Zoster (Shingles) Vaccine (1 of 2) 02/22/2024*   Flu  Shot  06/24/2024*   Medicare Annual Wellness Visit  11/13/2024   Colon Cancer Screening  12/04/2025   Pneumococcal Vaccine for age over 65  Completed   Hepatitis C Screening  Completed   HPV Vaccine  Aged Out   Meningitis B Vaccine  Aged Out   COVID-19 Vaccine  Discontinued  *Topic was postponed. The date shown is not the original due date.     ALLERGIES: Beta adrenergic blockers  Current Outpatient Medications on File Prior to Visit  Medication Sig Dispense Refill   Cholecalciferol 25 MCG (1000 UT) capsule Take 1,000 Units by mouth daily.     clopidogrel  (PLAVIX ) 75 MG tablet Take 1 tablet (75 mg total) by mouth daily. 90 tablet 3   ezetimibe  (ZETIA ) 10 MG tablet Take 1 tablet (10 mg total) by mouth daily. 90 tablet 3   Magnesium 250 MG TABS Take 1 tablet by mouth daily.     omeprazole  (PRILOSEC) 20 MG capsule Take 1 capsule (20 mg total) by mouth daily. 90 capsule 3   rosuvastatin  (CRESTOR ) 40 MG tablet Take 1 tablet (40 mg total) by mouth daily. 90 tablet 3   telmisartan -hydrochlorothiazide (MICARDIS  HCT) 40-12.5 MG tablet Take 1 tablet by mouth daily. 90 tablet 3   nitrofurantoin , macrocrystal-monohydrate, (MACROBID ) 100 MG capsule Take 1 capsule (100 mg total) by mouth 2 (two) times daily. (Patient not taking: Reported on 11/22/2023) 10 capsule 0   No current facility-administered medications on file prior  to visit.    Review of Systems  Constitutional:  Negative for chills and fever.  HENT:  Negative for congestion, ear pain, rhinorrhea, sinus pressure and sore throat.   Respiratory:  Negative for cough, shortness of breath and wheezing.   Cardiovascular:  Negative for chest pain and palpitations.  Gastrointestinal:  Negative for abdominal pain, diarrhea, nausea and vomiting.  Genitourinary:  Negative for dysuria.  Musculoskeletal:  Negative for myalgias.  Skin:  Negative for rash.  Neurological:  Negative for headaches.  Hematological:  Negative for adenopathy.       Objective:    BP 130/70   Pulse 78   Temp 98.4 F (36.9 C) (Oral)   Ht 6' 1 (1.854 m)   Wt 220 lb 6.4 oz (100 kg)   SpO2 98%   BMI 29.08 kg/m   BP Readings from Last 3 Encounters:  11/22/23 130/70  11/08/23 (!) 146/89  10/11/23 135/81   Wt Readings from Last 3 Encounters:  11/22/23 220 lb 6.4 oz (100 kg)  11/14/23 222 lb (100.7 kg)  08/17/23 224 lb 2 oz (101.7 kg)    Physical Exam Vitals reviewed.  Constitutional:      Appearance: Normal appearance. He is well-developed.  Neck:     Thyroid : No thyroid  mass or thyromegaly.  Cardiovascular:     Rate and Rhythm: Regular rhythm.     Heart sounds: Normal heart sounds.  Pulmonary:     Effort: Pulmonary effort is normal. No respiratory distress.     Breath sounds: Normal breath sounds. No wheezing, rhonchi or rales.  Abdominal:     General: Bowel sounds are normal. There is no distension.     Palpations: Abdomen is soft. Abdomen is not rigid. There is no fluid wave or mass.     Tenderness: There is no abdominal tenderness. There is no guarding or rebound. Negative signs include Murphy's sign and McBurney's sign.     Hernia: A hernia is present. Hernia is present in the umbilical area.      Comments: Quarter size nontender reducible umbilical hernia noted on exam.  No erythema, abscess  Lymphadenopathy:     Head:     Right side of head: No submental, submandibular, tonsillar, preauricular, posterior auricular or occipital adenopathy.     Left side of head: No submental, submandibular, tonsillar, preauricular, posterior auricular or occipital adenopathy.     Cervical: No cervical adenopathy.  Skin:    General: Skin is warm and dry.  Neurological:     Mental Status: He is alert.  Psychiatric:        Speech: Speech normal.        Behavior: Behavior normal.

## 2023-11-22 NOTE — Assessment & Plan Note (Signed)
 Advised patient to have Shingrix, Tdap vaccine at local pharmacy.  Congratulated patient on diligence to exercise.  He will continue close follow-up with dermatology, urology.

## 2023-12-05 NOTE — Telephone Encounter (Signed)
 error

## 2023-12-20 ENCOUNTER — Encounter: Payer: Self-pay | Admitting: Urology

## 2023-12-21 ENCOUNTER — Encounter: Payer: Self-pay | Admitting: Pharmacist

## 2023-12-21 NOTE — Progress Notes (Signed)
 Pharmacy Quality Measure Review  This patient is appearing on a report for being at risk of failing the adherence measure for cholesterol (statin) medications this calendar year.   Medication: rosuvastatin  40 mg Last fill date: 3/19 for 90 day supply  Insurance report was not up to date. No action needed at this time.  Medication has been refilled as of 10/17/23 x90 day supply.  Two additional refills remaining.  Next refill due 01/15/24. Reminder set

## 2023-12-24 DIAGNOSIS — D2271 Melanocytic nevi of right lower limb, including hip: Secondary | ICD-10-CM | POA: Diagnosis not present

## 2023-12-24 DIAGNOSIS — L820 Inflamed seborrheic keratosis: Secondary | ICD-10-CM | POA: Diagnosis not present

## 2023-12-24 DIAGNOSIS — L570 Actinic keratosis: Secondary | ICD-10-CM | POA: Diagnosis not present

## 2023-12-24 DIAGNOSIS — D2261 Melanocytic nevi of right upper limb, including shoulder: Secondary | ICD-10-CM | POA: Diagnosis not present

## 2023-12-24 DIAGNOSIS — Z85828 Personal history of other malignant neoplasm of skin: Secondary | ICD-10-CM | POA: Diagnosis not present

## 2023-12-24 DIAGNOSIS — L538 Other specified erythematous conditions: Secondary | ICD-10-CM | POA: Diagnosis not present

## 2023-12-24 DIAGNOSIS — D2262 Melanocytic nevi of left upper limb, including shoulder: Secondary | ICD-10-CM | POA: Diagnosis not present

## 2023-12-24 DIAGNOSIS — D2272 Melanocytic nevi of left lower limb, including hip: Secondary | ICD-10-CM | POA: Diagnosis not present

## 2023-12-24 DIAGNOSIS — C44629 Squamous cell carcinoma of skin of left upper limb, including shoulder: Secondary | ICD-10-CM | POA: Diagnosis not present

## 2023-12-24 DIAGNOSIS — D485 Neoplasm of uncertain behavior of skin: Secondary | ICD-10-CM | POA: Diagnosis not present

## 2024-01-28 ENCOUNTER — Encounter: Payer: Self-pay | Admitting: Pharmacist

## 2024-01-28 NOTE — Progress Notes (Signed)
 Pharmacy Quality Measure Review  This patient is appearing on a report for being at risk of failing the adherence measure for cholesterol (statin) medications this calendar year.   Medication: rosuvastatin  40 mg Last fill date: 10/17/23 for 90 day supply  Contacted pharmacy to facilitate refills.

## 2024-02-07 DIAGNOSIS — C44629 Squamous cell carcinoma of skin of left upper limb, including shoulder: Secondary | ICD-10-CM | POA: Diagnosis not present

## 2024-02-07 DIAGNOSIS — L905 Scar conditions and fibrosis of skin: Secondary | ICD-10-CM | POA: Diagnosis not present

## 2024-03-19 ENCOUNTER — Ambulatory Visit

## 2024-03-24 ENCOUNTER — Ambulatory Visit: Admitting: Family

## 2024-04-01 ENCOUNTER — Other Ambulatory Visit: Payer: Self-pay

## 2024-04-01 DIAGNOSIS — R972 Elevated prostate specific antigen [PSA]: Secondary | ICD-10-CM

## 2024-04-07 ENCOUNTER — Other Ambulatory Visit

## 2024-04-10 ENCOUNTER — Other Ambulatory Visit

## 2024-04-10 DIAGNOSIS — R972 Elevated prostate specific antigen [PSA]: Secondary | ICD-10-CM

## 2024-04-11 ENCOUNTER — Ambulatory Visit: Payer: Self-pay | Admitting: Urology

## 2024-04-11 LAB — PSA: Prostate Specific Ag, Serum: 7.7 ng/mL — ABNORMAL HIGH (ref 0.0–4.0)

## 2024-06-10 ENCOUNTER — Ambulatory Visit: Admitting: Urology

## 2024-06-12 ENCOUNTER — Ambulatory Visit: Admitting: Urology

## 2024-11-17 ENCOUNTER — Ambulatory Visit

## 2024-11-25 ENCOUNTER — Encounter: Admitting: Family
